# Patient Record
Sex: Female | Born: 1940
Health system: Southern US, Community
[De-identification: ages and names within clinical notes are randomized; demographics above are authoritative.]

## PROBLEM LIST (undated history)

## (undated) DIAGNOSIS — Z7901 Long term (current) use of anticoagulants: Secondary | ICD-10-CM

## (undated) DIAGNOSIS — E039 Hypothyroidism, unspecified: Secondary | ICD-10-CM

## (undated) DIAGNOSIS — I499 Cardiac arrhythmia, unspecified: Secondary | ICD-10-CM

## (undated) DIAGNOSIS — S42309A Unspecified fracture of shaft of humerus, unspecified arm, initial encounter for closed fracture: Secondary | ICD-10-CM

## (undated) DIAGNOSIS — O039 Complete or unspecified spontaneous abortion without complication: Secondary | ICD-10-CM

## (undated) DIAGNOSIS — I4891 Unspecified atrial fibrillation: Secondary | ICD-10-CM

## (undated) DIAGNOSIS — I10 Essential (primary) hypertension: Secondary | ICD-10-CM

## (undated) DIAGNOSIS — K5790 Diverticulosis of intestine, part unspecified, without perforation or abscess without bleeding: Secondary | ICD-10-CM

## (undated) DIAGNOSIS — M810 Age-related osteoporosis without current pathological fracture: Secondary | ICD-10-CM

## (undated) DIAGNOSIS — C801 Malignant (primary) neoplasm, unspecified: Secondary | ICD-10-CM

## (undated) DIAGNOSIS — K760 Fatty (change of) liver, not elsewhere classified: Secondary | ICD-10-CM

## (undated) DIAGNOSIS — K08109 Complete loss of teeth, unspecified cause, unspecified class: Secondary | ICD-10-CM

## (undated) DIAGNOSIS — I700 Atherosclerosis of aorta: Secondary | ICD-10-CM

## (undated) DIAGNOSIS — E785 Hyperlipidemia, unspecified: Secondary | ICD-10-CM

## (undated) DIAGNOSIS — I2510 Atherosclerotic heart disease of native coronary artery without angina pectoris: Secondary | ICD-10-CM

## (undated) DIAGNOSIS — R7309 Other abnormal glucose: Secondary | ICD-10-CM

## (undated) DIAGNOSIS — I509 Heart failure, unspecified: Secondary | ICD-10-CM

## (undated) DIAGNOSIS — Z972 Presence of dental prosthetic device (complete) (partial): Secondary | ICD-10-CM

## (undated) DIAGNOSIS — C4491 Basal cell carcinoma of skin, unspecified: Secondary | ICD-10-CM

## (undated) DIAGNOSIS — F0390 Unspecified dementia without behavioral disturbance: Secondary | ICD-10-CM

## (undated) DIAGNOSIS — G4733 Obstructive sleep apnea (adult) (pediatric): Secondary | ICD-10-CM

## (undated) DIAGNOSIS — S02609A Fracture of mandible, unspecified, initial encounter for closed fracture: Secondary | ICD-10-CM

## (undated) DIAGNOSIS — G4730 Sleep apnea, unspecified: Secondary | ICD-10-CM

## (undated) DIAGNOSIS — R0600 Dyspnea, unspecified: Secondary | ICD-10-CM

## (undated) DIAGNOSIS — K219 Gastro-esophageal reflux disease without esophagitis: Secondary | ICD-10-CM

## (undated) DIAGNOSIS — N183 Chronic kidney disease, stage 3 unspecified: Secondary | ICD-10-CM

## (undated) DIAGNOSIS — E041 Nontoxic single thyroid nodule: Secondary | ICD-10-CM

## (undated) DIAGNOSIS — N189 Chronic kidney disease, unspecified: Secondary | ICD-10-CM

## (undated) DIAGNOSIS — R918 Other nonspecific abnormal finding of lung field: Secondary | ICD-10-CM

## (undated) DIAGNOSIS — R0609 Other forms of dyspnea: Secondary | ICD-10-CM

## (undated) DIAGNOSIS — M1990 Unspecified osteoarthritis, unspecified site: Secondary | ICD-10-CM

## (undated) HISTORY — PX: CATARACT EXTRACTION: SUR2

## (undated) HISTORY — DX: Hyperlipidemia, unspecified: E78.5

## (undated) HISTORY — DX: Other abnormal glucose: R73.09

## (undated) HISTORY — DX: Complete or unspecified spontaneous abortion without complication: O03.9

## (undated) HISTORY — DX: Sleep apnea, unspecified: G47.30

## (undated) HISTORY — DX: Fracture of mandible, unspecified, initial encounter for closed fracture: S02.609A

## (undated) HISTORY — DX: Age-related osteoporosis without current pathological fracture: M81.0

## (undated) HISTORY — DX: Nontoxic single thyroid nodule: E04.1

## (undated) HISTORY — DX: Essential (primary) hypertension: I10

## (undated) HISTORY — PX: COLONOSCOPY: SHX174

## (undated) HISTORY — DX: Chronic kidney disease, unspecified: N18.9

## (undated) HISTORY — DX: Unspecified atrial fibrillation: I48.91

## (undated) HISTORY — DX: Unspecified fracture of shaft of humerus, unspecified arm, initial encounter for closed fracture: S42.309A

---

## 1995-02-16 DIAGNOSIS — S02609A Fracture of mandible, unspecified, initial encounter for closed fracture: Secondary | ICD-10-CM

## 1995-02-16 HISTORY — DX: Fracture of mandible, unspecified, initial encounter for closed fracture: S02.609A

## 2001-08-18 HISTORY — PX: JOINT REPLACEMENT: SHX530

## 2005-08-04 ENCOUNTER — Emergency Department: Payer: Self-pay | Admitting: Emergency Medicine

## 2005-09-25 ENCOUNTER — Other Ambulatory Visit: Payer: Self-pay

## 2005-10-02 ENCOUNTER — Inpatient Hospital Stay: Payer: Self-pay | Admitting: Unknown Physician Specialty

## 2009-06-19 ENCOUNTER — Ambulatory Visit: Payer: Self-pay | Admitting: Internal Medicine

## 2009-06-27 ENCOUNTER — Ambulatory Visit: Payer: Self-pay | Admitting: Internal Medicine

## 2010-04-03 ENCOUNTER — Ambulatory Visit: Payer: Self-pay | Admitting: Family Medicine

## 2010-04-04 ENCOUNTER — Ambulatory Visit (HOSPITAL_COMMUNITY): Admission: RE | Admit: 2010-04-04 | Discharge: 2010-04-05 | Payer: Self-pay | Admitting: Ophthalmology

## 2010-11-01 LAB — CBC
Hemoglobin: 12.1 g/dL (ref 12.0–15.0)
MCH: 30.1 pg (ref 26.0–34.0)
MCHC: 32.6 g/dL (ref 30.0–36.0)
WBC: 8.9 10*3/uL (ref 4.0–10.5)

## 2010-11-01 LAB — BASIC METABOLIC PANEL
BUN: 16 mg/dL (ref 6–23)
Calcium: 9.4 mg/dL (ref 8.4–10.5)
GFR calc non Af Amer: 51 mL/min — ABNORMAL LOW (ref >60)
Glucose, Bld: 105 mg/dL — ABNORMAL HIGH (ref 70–99)
Potassium: 4.6 mEq/L (ref 3.5–5.1)

## 2011-04-17 ENCOUNTER — Ambulatory Visit: Payer: Self-pay | Admitting: Family Medicine

## 2011-06-03 ENCOUNTER — Ambulatory Visit: Payer: Self-pay | Admitting: Family Medicine

## 2011-06-12 ENCOUNTER — Ambulatory Visit: Payer: Self-pay | Admitting: Family Medicine

## 2011-07-09 ENCOUNTER — Ambulatory Visit: Payer: Self-pay | Admitting: Family Medicine

## 2011-10-16 ENCOUNTER — Ambulatory Visit: Payer: Self-pay | Admitting: Family Medicine

## 2012-04-20 ENCOUNTER — Ambulatory Visit: Payer: Self-pay | Admitting: Family Medicine

## 2012-04-22 ENCOUNTER — Ambulatory Visit: Payer: Self-pay | Admitting: Unknown Physician Specialty

## 2012-04-27 LAB — PATHOLOGY REPORT

## 2012-05-05 ENCOUNTER — Ambulatory Visit: Payer: Self-pay | Admitting: Hematology and Oncology

## 2012-05-05 LAB — CBC CANCER CENTER
Basophil %: 0.9 %
Eosinophil %: 19.5 %
HGB: 12.5 g/dL (ref 12.0–16.0)
Lymphocyte %: 21.4 %
MCH: 30.6 pg (ref 26.0–34.0)
MCV: 94 fL (ref 80–100)
Monocyte #: 0.4 x10 3/mm (ref 0.2–0.9)
Monocyte %: 5.3 %
Neutrophil %: 52.9 %
Platelet: 150 x10 3/mm (ref 150–440)
RBC: 4.08 10*6/uL (ref 3.80–5.20)
WBC: 7 x10 3/mm (ref 3.6–11.0)

## 2012-05-18 ENCOUNTER — Ambulatory Visit: Payer: Self-pay | Admitting: Hematology and Oncology

## 2012-06-16 LAB — CBC CANCER CENTER
Basophil #: 0.1 x10 3/mm (ref 0.0–0.1)
Eosinophil #: 1.9 x10 3/mm — ABNORMAL HIGH (ref 0.0–0.7)
HGB: 12.5 g/dL (ref 12.0–16.0)
Lymphocyte #: 2 x10 3/mm (ref 1.0–3.6)
Lymphocyte %: 20 %
MCH: 29.8 pg (ref 26.0–34.0)
Monocyte #: 0.7 x10 3/mm (ref 0.2–0.9)
Neutrophil #: 5.3 x10 3/mm (ref 1.4–6.5)
Neutrophil %: 53.4 %
Platelet: 148 x10 3/mm — ABNORMAL LOW (ref 150–440)
RBC: 4.2 10*6/uL (ref 3.80–5.20)

## 2012-06-18 ENCOUNTER — Ambulatory Visit: Payer: Self-pay | Admitting: Hematology and Oncology

## 2012-08-18 HISTORY — PX: CHOLECYSTECTOMY: SHX55

## 2012-09-02 ENCOUNTER — Ambulatory Visit: Payer: Self-pay | Admitting: Unknown Physician Specialty

## 2012-09-21 ENCOUNTER — Ambulatory Visit: Payer: Self-pay | Admitting: General Surgery

## 2012-10-07 ENCOUNTER — Ambulatory Visit: Payer: Self-pay | Admitting: General Surgery

## 2012-10-14 ENCOUNTER — Ambulatory Visit: Payer: Self-pay | Admitting: General Surgery

## 2013-04-21 ENCOUNTER — Ambulatory Visit: Payer: Self-pay | Admitting: Family Medicine

## 2014-02-14 ENCOUNTER — Ambulatory Visit: Payer: Self-pay | Admitting: Family Medicine

## 2014-03-15 ENCOUNTER — Ambulatory Visit: Payer: Self-pay | Admitting: Family Medicine

## 2014-04-06 DIAGNOSIS — I482 Chronic atrial fibrillation, unspecified: Secondary | ICD-10-CM | POA: Insufficient documentation

## 2014-04-06 DIAGNOSIS — I10 Essential (primary) hypertension: Secondary | ICD-10-CM | POA: Insufficient documentation

## 2014-05-23 ENCOUNTER — Ambulatory Visit: Payer: Self-pay | Admitting: Family Medicine

## 2014-10-11 DIAGNOSIS — I5022 Chronic systolic (congestive) heart failure: Secondary | ICD-10-CM | POA: Insufficient documentation

## 2014-10-26 DIAGNOSIS — I509 Heart failure, unspecified: Secondary | ICD-10-CM

## 2014-10-26 HISTORY — DX: Heart failure, unspecified: I50.9

## 2014-12-08 NOTE — Op Note (Signed)
PATIENT NAME:  Brittney Ramirez, Brittney Ramirez MR#:  [id number] DATE OF BIRTH:  May 18, 1941  DATE OF PROCEDURE:  10/14/2012  PREOPERATIVE DIAGNOSIS: Chronic cholecystitis.   POSTOPERATIVE DIAGNOSIS: Chronic cholecystitis.   OPERATIVE PROCEDURE: Laparoscopic cholecystectomy with intraoperative cholangiograms.   SURGEON: Robert Bellow, MD.   ANESTHESIA: General endotracheal.   ESTIMATED BLOOD LOSS: Less than 5 mL.   CLINICAL NOTE: This 74 year old woman has had recurrent episodic abdominal pain. Ultrasound did not show evidence of cholelithiasis, but there was a decreased ejection fraction on HIDA scan with CCK administration and with reproduction of her symptoms. She was felt to be a candidate for elective cholecystectomy. She received Kefzol prior to the procedure.   Xarelto was discontinued 3 days prior to the procedure on the advice of Roderic Palau, RN, from Serafina Royals, MD's office.   OPERATIVE NOTE: With the patient under adequate general endotracheal anesthesia, the abdomen was prepped with ChloraPrep and draped. A Veress needle was placed through a transumbilical incision. After assuring intra-abdominal location with the hanging drop test, the abdomen was insufflated with CO2 at 10 mmHg pressure. A 10 mm step port was expanded and inspection showed no evidence of injury from initial port placement. The patient was placed in reverse Trendelenburg position and rolled to the left. There was chronic scarring around the gallbladder. The duodenum was adherent to the neck of the gallbladder. This was taken down with gentle dissection. The common duct was visualized. Fluoroscopic cholangiograms were completed using 19 mL of half-strength Conray 60. This showed slightly prominent ducts, but no evidence of obstruction or stones. Free flow into the duodenum. Right and left hepatic ducts were identified.   The cystic duct and cystic artery were doubly clipped and divided. The gallbladder was then removed from  the liver bed making use of hook cautery dissection. It was delivered through the umbilical port site without incident. Inspection from the epigastric site showed no evidence of injury from initial port placement. The right upper quadrant was irrigated with lactated Ringer solution. Clips were felt to be adequate. The abdomen was desufflated and ports removed under direct vision.   The skin incisions were closed with 4-0 Vicryl subcuticular sutures. Benzoin, Steri-Strips, Telfa and Tegaderm dressings were applied.   The patient tolerated the procedure well and was taken to the recovery room in stable condition.    ____________________________ Robert Bellow, MD jwb:jm D: 10/14/2012 12:36:17 ET T: 10/14/2012 14:17:13 ET JOB#: [id number]  cc: Robert Bellow, MD, <Dictator> Guadalupe Maple, MD Corey Skains, MD Katoya Amato Amedeo Kinsman MD ELECTRONICALLY SIGNED 10/16/2012 9:40

## 2014-12-08 NOTE — Op Note (Signed)
PATIENT NAME:  Brittney Ramirez, Brittney Ramirez MR#:  [id number] DATE OF BIRTH:  04-08-1941  DATE OF PROCEDURE:  10/14/2012  PREOPERATIVE DIAGNOSIS: Chronic cholecystitis.   POSTOPERATIVE DIAGNOSIS:  Chronic cholecystitis.   OPERATIVE PROCEDURE: Laparoscopic cholecystectomy with intraoperative cholangiograms.   SURGEON: Robert Bellow, MD  ANESTHESIA: General endotracheal under Dr. Carolin Sicks.   ESTIMATED BLOOD LOSS: 5 mL.   CLINICAL NOTE: This 74 year old woman has had episodic abdominal pain. Previous ultrasound had failed to show evidence of cholelithiasis. HIDA scanning has shown decreased ejection fraction. She was felt to be a candidate for cholecystectomy.   OPERATIVE NOTE: With the patient under adequate general endotracheal anesthesia, the abdomen prepped with ChloraPrep and draped. In Trendelenburg position, a Veress needle was placed through a transumbilical incision. After assuring intra-abdominal location with the hanging drop test, the abdomen was insufflated with CO2 at 10 mmHg mercury. No evidence of injury from initial port placement. In reverse Trendelenburg and rolled to the left, an 11 mm Xcel port was placed in the epigastrium and two  5 mm step ports placed laterally. There were noted to be marked adhesions between the omentum, duodenum and the gallbladder. These were taken down with gentle dissection. The neck of the gallbladder was cleared, Kumar clamp placed and fluoroscopic cholangiograms completed using 19 mL of Conray 60 diluted 1 to 2 with saline. Normal cholangiograms were appreciated. The cystic duct and cystic artery were doubly clipped and divided and the gallbladder removed from the liver bed making use of hook cautery dissection. It was then delivered through the umbilical port site without difficulty. The right upper quadrant was irrigated with lactated Ringer's solution. Good hemostasis was noted. The abdomen was then desufflated under direct vision. A 0 Vicryl suture was placed  at the umbilicus. Skin incisions were closed with 4-0 Vicryl subcuticular sutures. Benzoin, Steri-Strips, Telfa, and Tegaderm dressings were applied. The patient tolerated the procedure well and was taken to the recovery room in stable condition.   ____________________________ Robert Bellow, MD jwb:cc D: 10/14/2012 17:51:43 ET T: 10/14/2012 21:20:23 ET JOB#: [id number]  cc: Robert Bellow, MD, <Dictator> Guadalupe Maple, MD Corey Skains, MD Malcomb Gangemi Amedeo Kinsman MD ELECTRONICALLY SIGNED 10/16/2012 9:40

## 2015-02-08 ENCOUNTER — Telehealth: Payer: Self-pay | Admitting: Family Medicine

## 2015-02-08 ENCOUNTER — Other Ambulatory Visit: Payer: Self-pay | Admitting: Family Medicine

## 2015-02-08 ENCOUNTER — Encounter: Payer: Self-pay | Admitting: Family Medicine

## 2015-02-08 ENCOUNTER — Ambulatory Visit: Payer: Medicare PPO

## 2015-02-08 ENCOUNTER — Ambulatory Visit (INDEPENDENT_AMBULATORY_CARE_PROVIDER_SITE_OTHER): Payer: Medicare PPO | Admitting: Family Medicine

## 2015-02-08 ENCOUNTER — Ambulatory Visit
Admission: RE | Admit: 2015-02-08 | Discharge: 2015-02-08 | Disposition: A | Discharge status: Home / Self Care | Payer: Medicare PPO | Source: Ambulatory Visit | Attending: Family Medicine | Admitting: Family Medicine

## 2015-02-08 VITALS — BP 146/83 | HR 64 | Temp 98.1°F | Ht 64.0 in | Wt 206.0 lb

## 2015-02-08 DIAGNOSIS — M549 Dorsalgia, unspecified: Principal | ICD-10-CM

## 2015-02-08 DIAGNOSIS — M25559 Pain in unspecified hip: Secondary | ICD-10-CM | POA: Insufficient documentation

## 2015-02-08 DIAGNOSIS — R2689 Other abnormalities of gait and mobility: Secondary | ICD-10-CM | POA: Diagnosis not present

## 2015-02-08 DIAGNOSIS — G8929 Other chronic pain: Secondary | ICD-10-CM

## 2015-02-08 DIAGNOSIS — M419 Scoliosis, unspecified: Secondary | ICD-10-CM | POA: Diagnosis not present

## 2015-02-08 DIAGNOSIS — R109 Unspecified abdominal pain: Secondary | ICD-10-CM | POA: Diagnosis not present

## 2015-02-08 DIAGNOSIS — M545 Low back pain: Secondary | ICD-10-CM | POA: Diagnosis present

## 2015-02-08 DIAGNOSIS — R1030 Lower abdominal pain, unspecified: Secondary | ICD-10-CM

## 2015-02-08 NOTE — Telephone Encounter (Signed)
-----   Message from [place] sent at 02/08/2015  4:46 PM EDT ----- Line 4230/ BOTH XRAYS

## 2015-02-08 NOTE — Telephone Encounter (Signed)
Pt with cont abd pain as per note, x rays showing arthritis changes but otherwise no cause of pain Will send back to GI to further check

## 2015-02-08 NOTE — Progress Notes (Signed)
   BP 146/83 mmHg  Pulse 64  Temp(Src) 98.1 F (36.7 C)  Ht 5\' 4"  (1.626 m)  Wt 206 lb (93.441 kg)  BMI 35.34 kg/m2  SpO2 99%   Subjective:    Patient ID: Brittney Ramirez, female    DOB: 1941-04-29, 74 y.o.   MRN: [id number]  HPI: Brittney Ramirez is a 74 y.o. female  Chief Complaint  Patient presents with  . Hip Pain    right side  pt with 2-3 years of RLQ abd pain with neg CT x2 neg colonoscopy and GI consult Neg relief with gall stone removal Above from chart review and discussion with pt and son.  Pt with long hx after having knee replaced of short leg and marked limp No back pain or hip pain c/o  Relevant past medical, surgical, family and social history reviewed and updated as indicated. Interim medical history since our last visit reviewed. Allergies and medications reviewed and updated.  Review of Systems  Constitutional: Negative.   Respiratory: Negative.   Cardiovascular: Negative.   Endocrine: Negative.   Genitourinary: Negative.   Allergic/Immunologic: Negative.     Per HPI unless specifically indicated above     Objective:    BP 146/83 mmHg  Pulse 64  Temp(Src) 98.1 F (36.7 C)  Ht 5\' 4"  (1.626 m)  Wt 206 lb (93.441 kg)  BMI 35.34 kg/m2  SpO2 99%  Wt Readings from Last 3 Encounters:  02/08/15 206 lb (93.441 kg)  10/10/14 197 lb (89.359 kg)    Physical Exam  Constitutional: She is oriented to person, place, and time. She appears well-developed and well-nourished. No distress.  HENT:  Head: Normocephalic and atraumatic.  Right Ear: Hearing normal.  Left Ear: Hearing normal.  Nose: Nose normal.  Eyes: Conjunctivae and lids are normal. Right eye exhibits no discharge. Left eye exhibits no discharge. No scleral icterus.  Pulmonary/Chest: Effort normal. No respiratory distress.  Abdominal: There is tenderness.  Marked tenderness RLQ no mass  Musculoskeletal: Normal range of motion.  Hips full rom no pain  Neurological: She is alert and oriented to  person, place, and time.  Skin: Skin is intact. No rash noted.  Psychiatric: She has a normal mood and affect. Her speech is normal and behavior is normal. Judgment and thought content normal. Cognition and memory are normal.        Assessment & Plan:   Problem List Items Addressed This Visit    None    Visit Diagnoses    Abdominal pain in female    -  Primary    with chronic nature and neg w/u will xray hips and back    Limp        unknown if playing a role in sx or not but x rays may show  unable to use order entry for x ray        Follow up plan: Return if symptoms worsen or fail to improve.

## 2015-02-27 ENCOUNTER — Other Ambulatory Visit: Payer: Self-pay | Admitting: Family Medicine

## 2015-03-05 ENCOUNTER — Other Ambulatory Visit: Payer: Self-pay | Admitting: Family Medicine

## 2015-03-26 ENCOUNTER — Other Ambulatory Visit: Payer: Self-pay | Admitting: Physician Assistant

## 2015-03-26 DIAGNOSIS — R1031 Right lower quadrant pain: Secondary | ICD-10-CM

## 2015-03-26 DIAGNOSIS — G8929 Other chronic pain: Secondary | ICD-10-CM

## 2015-04-02 ENCOUNTER — Ambulatory Visit
Admission: RE | Admit: 2015-04-02 | Discharge: 2015-04-02 | Disposition: A | Discharge status: Home / Self Care | Payer: Medicare PPO | Source: Ambulatory Visit | Attending: Physician Assistant | Admitting: Physician Assistant

## 2015-04-02 DIAGNOSIS — R1031 Right lower quadrant pain: Secondary | ICD-10-CM | POA: Diagnosis present

## 2015-04-02 DIAGNOSIS — G8929 Other chronic pain: Secondary | ICD-10-CM | POA: Insufficient documentation

## 2015-04-02 DIAGNOSIS — K5730 Diverticulosis of large intestine without perforation or abscess without bleeding: Secondary | ICD-10-CM | POA: Diagnosis not present

## 2015-04-02 MED ORDER — IOHEXOL 300 MG/ML  SOLN
80.0000 mL | Freq: Once | INTRAMUSCULAR | Status: AC | PRN
  Administered 2015-04-02: 100 mL via INTRAVENOUS

## 2015-04-02 MED ORDER — IOHEXOL 300 MG/ML  SOLN: 100.0000 mL | Freq: Once | INTRAMUSCULAR | Status: DC | PRN

## 2015-04-16 ENCOUNTER — Emergency Department: Payer: Medicare PPO

## 2015-04-16 ENCOUNTER — Encounter: Payer: Self-pay | Admitting: Urgent Care

## 2015-04-16 ENCOUNTER — Emergency Department
Admission: EM | Admit: 2015-04-16 | Discharge: 2015-04-16 | Disposition: A | Discharge status: Home / Self Care | Payer: Medicare PPO | Attending: Student | Admitting: Student

## 2015-04-16 DIAGNOSIS — N189 Chronic kidney disease, unspecified: Secondary | ICD-10-CM | POA: Insufficient documentation

## 2015-04-16 DIAGNOSIS — S3992XA Unspecified injury of lower back, initial encounter: Secondary | ICD-10-CM | POA: Insufficient documentation

## 2015-04-16 DIAGNOSIS — E039 Hypothyroidism, unspecified: Secondary | ICD-10-CM | POA: Insufficient documentation

## 2015-04-16 DIAGNOSIS — W07XXXA Fall from chair, initial encounter: Secondary | ICD-10-CM | POA: Diagnosis not present

## 2015-04-16 DIAGNOSIS — Z7901 Long term (current) use of anticoagulants: Secondary | ICD-10-CM | POA: Diagnosis not present

## 2015-04-16 DIAGNOSIS — Y9289 Other specified places as the place of occurrence of the external cause: Secondary | ICD-10-CM | POA: Diagnosis not present

## 2015-04-16 DIAGNOSIS — M545 Low back pain, unspecified: Secondary | ICD-10-CM

## 2015-04-16 DIAGNOSIS — Z79899 Other long term (current) drug therapy: Secondary | ICD-10-CM | POA: Diagnosis not present

## 2015-04-16 DIAGNOSIS — Z87891 Personal history of nicotine dependence: Secondary | ICD-10-CM | POA: Insufficient documentation

## 2015-04-16 DIAGNOSIS — I129 Hypertensive chronic kidney disease with stage 1 through stage 4 chronic kidney disease, or unspecified chronic kidney disease: Secondary | ICD-10-CM | POA: Insufficient documentation

## 2015-04-16 DIAGNOSIS — Y9389 Activity, other specified: Secondary | ICD-10-CM | POA: Insufficient documentation

## 2015-04-16 DIAGNOSIS — Y998 Other external cause status: Secondary | ICD-10-CM | POA: Diagnosis not present

## 2015-04-16 DIAGNOSIS — E785 Hyperlipidemia, unspecified: Secondary | ICD-10-CM | POA: Diagnosis not present

## 2015-04-16 MED ORDER — TRAMADOL HCL 50 MG PO TABS: 50.0000 mg | ORAL_TABLET | Freq: Four times a day (QID) | ORAL | Status: DC | PRN

## 2015-04-16 MED ORDER — TRAMADOL HCL 50 MG PO TABS
50.0000 mg | ORAL_TABLET | Freq: Once | ORAL | Status: AC
  Administered 2015-04-16: 50 mg via ORAL
  Filled 2015-04-16: qty 1

## 2015-04-16 NOTE — ED Provider Notes (Signed)
[facility] Emergency Department Provider Note  ____________________________________________  Time seen: Approximately 10:24 PM  I have reviewed the triage vital signs and the nursing notes.   HISTORY  Chief Complaint Fall    HPI Brittney Ramirez is a 74 y.o. female patient complaining of low back pain status post falling from a chair. Patient states she twisted her back when she fell out of the chair and landed on her right side patient denies any pain to the right upper or right lower extremities. Patient is rating the pain as a 9/10 describe the sharp. Patient states pain increase ambulation. No palliative measures taken for this complaint.   Past Medical History  Diagnosis Date  . Chronic kidney disease   . Hyperlipidemia   . Hypertension   . Osteoporosis   . Atrial fibrillation   . Sleep apnea   . Elevated glucose   . Miscarriage   . Thyroid disease   . Thyroid cyst   . Jaw fracture   . Arm fracture     There are no active problems to display for this patient.   Past Surgical History  Procedure Laterality Date  . Cholecystectomy  2014  . Joint replacement Right 2003    Current Outpatient Rx  Name  Route  Sig  Dispense  Refill  . atorvastatin (LIPITOR) 40 MG tablet      TAKE 1 TABLET AT BEDTIME   90 tablet   1   . calcium carbonate (OS-CAL) 600 MG TABS tablet   Oral   Take 600 mg by mouth 2 (two) times daily with a meal.         . latanoprost (XALATAN) 0.005 % ophthalmic solution               . levothyroxine (LEVOXYL) 75 MCG tablet   Oral   Take 75 mcg by mouth daily before breakfast.         . metoprolol tartrate (LOPRESSOR) 25 MG tablet      TAKE 1 TABLET THREE TIMES DAILY   270 tablet   1   . omeprazole (PRILOSEC) 20 MG capsule   Oral   Take 20 mg by mouth daily.         . rivaroxaban (XARELTO) 20 MG TABS tablet   Oral   Take 20 mg by mouth daily.         . valsartan-hydrochlorothiazide (DIOVAN-HCT)  160-25 MG per tablet      TAKE 1 TABLET EVERY DAY   90 tablet   1     Allergies Codeine; Morphine and related; and Alendronate  Family History  Problem Relation Age of Onset  . Heart disease Mother   . Hypertension Mother   . Heart disease Father   . Hypertension Father   . Arthritis Sister   . Cancer Sister   . Diabetes Sister   . Heart disease Sister   . Diabetes Brother   . Heart disease Brother   . Hypertension Brother     Social History Social History  Substance Use Topics  . Smoking status: Former Smoker    Start date: 02/08/1955    Quit date: 02/08/1955  . Smokeless tobacco: Never Used  . Alcohol Use: No    Review of Systems Constitutional: No fever/chills Eyes: No visual changes. ENT: No sore throat. Cardiovascular: Denies chest pain. Respiratory: Denies shortness of breath. Gastrointestinal: No abdominal pain.  No nausea, no vomiting.  No diarrhea.  No constipation. Genitourinary: Negative for  dysuria. Musculoskeletal: Positive for back pain. Skin: Negative for rash. Neurological: Negative for headaches, focal weakness or numbness. Endocrine: Hyperlipidemia, hypothyroidism, and hypertension Allergic/Immunilogical: See medication list  10-point ROS otherwise negative.  ____________________________________________   PHYSICAL EXAM:  VITAL SIGNS: ED Triage Vitals  Enc Vitals Group     BP 04/16/15 2133 137/86 mmHg     Pulse Rate 04/16/15 2133 76     Resp 04/16/15 2133 18     Temp 04/16/15 2133 98.6 F (37 C)     Temp Source 04/16/15 2133 Oral     SpO2 04/16/15 2133 100 %     Weight 04/16/15 2133 190 lb (86.183 kg)     Height 04/16/15 2133 5\' 6"  (1.676 m)     Head Cir --      Peak Flow --      Pain Score 04/16/15 2133 9     Pain Loc --      Pain Edu? --      Excl. in [place]? --     Constitutional: Alert and oriented. Well appearing and in no acute distress. Eyes: Conjunctivae are normal. PERRL. EOMI. Head: Atraumatic. Nose: No  congestion/rhinnorhea. Mouth/Throat: Mucous membranes are moist.  Oropharynx non-erythematous. Neck: No stridor.  No cervical spine tenderness to palpation. Hematological/Lymphatic/Immunilogical: No cervical lymphadenopathy. Cardiovascular: Normal rate, regular rhythm. Grossly normal heart sounds.  Good peripheral circulation. Respiratory: Normal respiratory effort.  No retractions. Lungs CTAB. Gastrointestinal: Soft and nontender. No distention. No abdominal bruits. No CVA tenderness. Musculoskeletal: No spinal deformity. Patient has some moderate guarding L4-S1. Neurologic:  Normal speech and language. No gross focal neurologic deficits are appreciated. No gait instability. Skin:  Skin is warm, dry and intact. No rash noted. Psychiatric: Mood and affect are normal. Speech and behavior are normal.  ____________________________________________   LABS (all labs ordered are listed, but only abnormal results are displayed)  Labs Reviewed - No data to display ____________________________________________  EKG   ____________________________________________  RADIOLOGY  No acute findings. I, Sable Feil, personally viewed and evaluated these images (plain radiographs) as part of my medical decision making.   ____________________________________________   PROCEDURES  Procedure(s) performed: None  Critical Care performed: No  ____________________________________________   INITIAL IMPRESSION / ASSESSMENT AND PLAN / ED COURSE  Pertinent labs & imaging results that were available during my care of the patient were reviewed by me and considered in my medical decision making (see chart for details).  Back pain secondary to a fall. Moderate degenerative disc disease. Discussed x-ray findings with patient. Patient given advice and directions for supportive care. Patient given a three-day prescription for tramadol to take as needed for pain. Patient advised follow-up with the family  doctor if condition persists. ____________________________________________   FINAL CLINICAL IMPRESSION(S) / ED DIAGNOSES  Final diagnoses:  Back pain at L4-L5 level      Sable Feil, PA-C 04/16/15 [id number]  Joanne Gavel, MD 04/16/15 [phone]

## 2015-04-16 NOTE — ED Notes (Signed)
Patient transported to X-ray 

## 2015-04-16 NOTE — ED Notes (Signed)
Patient presents with c/o lower back pain s/p fall. Patient reports that she twisted wrong when she fell out of a deck chair. Reports falling onto RUE and RIGHT hip, however denies pain to both areas.

## 2015-04-17 ENCOUNTER — Encounter: Payer: Self-pay | Admitting: Family Medicine

## 2015-05-07 DIAGNOSIS — R918 Other nonspecific abnormal finding of lung field: Secondary | ICD-10-CM | POA: Insufficient documentation

## 2015-06-07 ENCOUNTER — Ambulatory Visit (INDEPENDENT_AMBULATORY_CARE_PROVIDER_SITE_OTHER): Payer: Medicare PPO | Admitting: Family Medicine

## 2015-06-07 ENCOUNTER — Encounter: Payer: Self-pay | Admitting: Family Medicine

## 2015-06-07 VITALS — BP 128/83 | HR 50 | Temp 98.6°F | Ht 63.7 in | Wt 204.0 lb

## 2015-06-07 DIAGNOSIS — I5022 Chronic systolic (congestive) heart failure: Secondary | ICD-10-CM | POA: Diagnosis not present

## 2015-06-07 DIAGNOSIS — R1084 Generalized abdominal pain: Secondary | ICD-10-CM

## 2015-06-07 DIAGNOSIS — N19 Unspecified kidney failure: Secondary | ICD-10-CM

## 2015-06-07 DIAGNOSIS — G4730 Sleep apnea, unspecified: Secondary | ICD-10-CM

## 2015-06-07 DIAGNOSIS — R55 Syncope and collapse: Secondary | ICD-10-CM

## 2015-06-07 DIAGNOSIS — I509 Heart failure, unspecified: Secondary | ICD-10-CM

## 2015-06-07 DIAGNOSIS — G8929 Other chronic pain: Secondary | ICD-10-CM

## 2015-06-07 DIAGNOSIS — Z0000 Encounter for general adult medical examination without abnormal findings: Secondary | ICD-10-CM | POA: Diagnosis not present

## 2015-06-07 DIAGNOSIS — I132 Hypertensive heart and chronic kidney disease with heart failure and with stage 5 chronic kidney disease, or end stage renal disease: Secondary | ICD-10-CM

## 2015-06-07 NOTE — Assessment & Plan Note (Addendum)
Discuss near-syncope and patient's symptoms.  most likely a combination of blood pressure drop along with longtime since last meal, extremely bad news. Possibly cardiac arrhythmia plan a role with poor perfusion. No evidence now of concern for pacemaker. Patient cautioned about further symptoms may need further heart monitoring Patient has referral appointment with cardiology coming up Reviewed last cardiology notes with patient stable. Will check CBC, CMP

## 2015-06-07 NOTE — Assessment & Plan Note (Signed)
Reviewed GI notes patient has appointment at [facility] coming up January

## 2015-06-07 NOTE — Progress Notes (Signed)
BP 128/83 mmHg  Pulse 50  Temp(Src) 98.6 F (37 C)  Ht 5' 3.7" (1.618 m)  Wt 204 lb (92.534 kg)  BMI 35.35 kg/m2  SpO2 99%   Subjective:    Patient ID: Brittney Ramirez, female    DOB: 1940/10/30, 74 y.o.   MRN: [id number]  HPI: Brittney Ramirez is a 74 y.o. female  Chief Complaint  Patient presents with  . Annual Exam   She with continued chronic localized abdominal pain is at complete GI workup CT scans etc. which are essentially negative through [facility] clinic GI. Patient has appointment at [facility] to further evaluate coming up in January. Reviewed CT scan of abdomen. Patient also had a fall with her her back and recovering okay x-ray showed no compression fractures There is a lot of degenerative joint disease and arthritis in her back. Hypertension doing well. No chest symptoms no chest tightness No PND orthopnea  Does not use CPAP machine because of poor sleep  Patient relates most recently though she had a fall very differently on September 26. About 1 PM patient had not eaten since breakfast. Patient was standing in the hallway listening to a phone conversation got very dizzy and lightheaded and was able to grab the door frame but unable to hang on and slid and fell backwards against the wall. She did hit her head slightly but no loss of consciousness was aware during the entire period. Laid back for a little while rested on the floor for about a minute or so then was able to get up and go okay with no subsequent spells or issues. She checked her pulse and blood pressure shortly afterwards blood pressure was 90/70 and doesn't remember what her pulse was. This also occurred after extremely bad news earlier that morning of a family member and suicide.  Relevant past medical, surgical, family and social history reviewed and updated as indicated. Interim medical history since our last visit reviewed. Allergies and medications reviewed and updated.  Other than noted above Review of  Systems  Constitutional: Negative.   HENT: Negative.   Eyes: Negative.   Respiratory: Negative.   Cardiovascular: Negative.   Gastrointestinal: Negative.   Endocrine: Negative.   Genitourinary: Negative.   Musculoskeletal: Negative.   Skin: Negative.   Allergic/Immunologic: Negative.   Neurological: Negative.   Hematological: Negative.   Psychiatric/Behavioral: Negative.     Per HPI unless specifically indicated above     Objective:    BP 128/83 mmHg  Pulse 50  Temp(Src) 98.6 F (37 C)  Ht 5' 3.7" (1.618 m)  Wt 204 lb (92.534 kg)  BMI 35.35 kg/m2  SpO2 99%  Wt Readings from Last 3 Encounters:  06/07/15 204 lb (92.534 kg)  04/16/15 190 lb (86.183 kg)  02/08/15 206 lb (93.441 kg)    Physical Exam  Constitutional: She is oriented to person, place, and time. She appears well-developed and well-nourished.  HENT:  Head: Normocephalic and atraumatic.  Right Ear: External ear normal.  Left Ear: External ear normal.  Nose: Nose normal.  Mouth/Throat: Oropharynx is clear and moist.  Eyes: Conjunctivae and EOM are normal. Pupils are equal, round, and reactive to light.  Neck: Normal range of motion. Neck supple. Carotid bruit is not present.  Cardiovascular:  No murmur heard. Irregular rhythm and rate  Pulmonary/Chest: Effort normal and breath sounds normal. She exhibits no mass. Right breast exhibits no mass, no skin change and no tenderness. Left breast exhibits no mass, no skin change  and no tenderness. Breasts are symmetrical.  Abdominal: Soft. Bowel sounds are normal. There is no hepatosplenomegaly.  Musculoskeletal: Normal range of motion.  Neurological: She is alert and oriented to person, place, and time.  Skin: No rash noted.  Psychiatric: She has a normal mood and affect. Her behavior is normal. Judgment and thought content normal.   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  most likely a combination of blood pressure drop along with longtime since last meal, extremely bad news. Possibly cardiac arrhythmia plan a role with poor perfusion. No evidence now of concern for pacemaker. Patient cautioned about further symptoms may need further heart monitoring Patient has referral appointment with cardiology coming up Reviewed last cardiology notes with patient stable. Will check CBC, CMP      Relevant Orders   Comprehensive metabolic panel   CBC with Differential/Platelet     Other   Sleep apnea    No CPAP use      Chronic generalized abdominal pain    Reviewed GI notes patient has appointment at [facility] coming up January       Other Visit Diagnoses    PE (physical exam), annual            Follow up plan: Return in about 6 months (around 12/06/2015), or if symptoms worsen or fail to improve, for Recheck BMP lipid panel, ALT, AST.

## 2015-06-07 NOTE — Assessment & Plan Note (Signed)
No CPAP use

## 2015-06-07 NOTE — Assessment & Plan Note (Signed)
Followed by cardiology 

## 2015-06-08 LAB — COMPREHENSIVE METABOLIC PANEL
A/G RATIO: 1.9 (ref 1.1–2.5)
ALT: 15 IU/L (ref 0–32)
AST: 23 IU/L (ref 0–40)
Albumin: 4.4 g/dL (ref 3.5–4.8)
Alkaline Phosphatase: 68 IU/L (ref 39–117)
BILIRUBIN TOTAL: 0.7 mg/dL (ref 0.0–1.2)
BUN/Creatinine Ratio: 17 (ref 11–26)
BUN: 21 mg/dL (ref 8–27)
CALCIUM: 10.4 mg/dL — AB (ref 8.7–10.3)
CHLORIDE: 97 mmol/L (ref 97–106)
CO2: 25 mmol/L (ref 18–29)
Creatinine, Ser: 1.23 mg/dL — ABNORMAL HIGH (ref 0.57–1.00)
GFR, EST AFRICAN AMERICAN: 49 mL/min/{1.73_m2} — AB (ref >59)
GFR, EST NON AFRICAN AMERICAN: 43 mL/min/{1.73_m2} — AB (ref >59)
GLOBULIN, TOTAL: 2.3 g/dL (ref 1.5–4.5)
Glucose: 100 mg/dL — ABNORMAL HIGH (ref 65–99)
POTASSIUM: 5 mmol/L (ref 3.5–5.2)
SODIUM: 137 mmol/L (ref 136–144)
TOTAL PROTEIN: 6.7 g/dL (ref 6.0–8.5)

## 2015-06-08 LAB — CBC WITH DIFFERENTIAL/PLATELET
BASOS ABS: 0.1 10*3/uL (ref 0.0–0.2)
BASOS: 1 %
EOS (ABSOLUTE): 1 10*3/uL — AB (ref 0.0–0.4)
Eos: 14 %
Hematocrit: 40.1 % (ref 34.0–46.6)
Hemoglobin: 13 g/dL (ref 11.1–15.9)
Immature Grans (Abs): 0 10*3/uL (ref 0.0–0.1)
Immature Granulocytes: 0 %
LYMPHS ABS: 2 10*3/uL (ref 0.7–3.1)
Lymphs: 26 %
MCH: 29.5 pg (ref 26.6–33.0)
MCHC: 32.4 g/dL (ref 31.5–35.7)
MCV: 91 fL (ref 79–97)
MONOS ABS: 0.5 10*3/uL (ref 0.1–0.9)
Monocytes: 7 %
NEUTROS ABS: 4 10*3/uL (ref 1.4–7.0)
Neutrophils: 52 %
Platelets: 193 10*3/uL (ref 150–379)
RBC: 4.41 x10E6/uL (ref 3.77–5.28)
RDW: 15.3 % (ref 12.3–15.4)
WBC: 7.6 10*3/uL (ref 3.4–10.8)

## 2015-06-11 ENCOUNTER — Encounter: Payer: Self-pay | Admitting: Family Medicine

## 2015-06-20 ENCOUNTER — Other Ambulatory Visit: Payer: Self-pay | Admitting: Specialist

## 2015-06-20 DIAGNOSIS — R911 Solitary pulmonary nodule: Secondary | ICD-10-CM

## 2015-07-19 ENCOUNTER — Other Ambulatory Visit: Payer: Self-pay | Admitting: Family Medicine

## 2015-07-24 ENCOUNTER — Other Ambulatory Visit: Payer: Self-pay | Admitting: Family Medicine

## 2015-10-01 ENCOUNTER — Other Ambulatory Visit: Payer: Self-pay

## 2015-10-01 MED ORDER — VALSARTAN-HYDROCHLOROTHIAZIDE 160-25 MG PO TABS: 1.0000 | ORAL_TABLET | Freq: Every day | ORAL | Status: DC

## 2015-10-01 MED ORDER — LEVOTHYROXINE SODIUM 75 MCG PO TABS: 75.0000 ug | ORAL_TABLET | Freq: Every day | ORAL | Status: DC

## 2015-10-01 MED ORDER — METOPROLOL TARTRATE 25 MG PO TABS: 25.0000 mg | ORAL_TABLET | Freq: Three times a day (TID) | ORAL | Status: DC

## 2015-10-01 MED ORDER — ATORVASTATIN CALCIUM 40 MG PO TABS: 40.0000 mg | ORAL_TABLET | Freq: Every day | ORAL | Status: DC

## 2015-10-01 MED ORDER — OMEPRAZOLE 20 MG PO CPDR: 20.0000 mg | DELAYED_RELEASE_CAPSULE | Freq: Every day | ORAL | Status: DC

## 2015-10-11 ENCOUNTER — Telehealth: Payer: Self-pay | Admitting: Family Medicine

## 2015-10-11 NOTE — Telephone Encounter (Signed)
Patient called stating that she needs to talk to Dr. Jeananne Rama about change in her medication. Please call her back [phone]

## 2015-10-11 NOTE — Telephone Encounter (Signed)
Phone call Discussed with patient Brittney Ramirez workup for abdominal pain was take more Prilosec and occasional ibuprofen concerned because taking blood thinner reviewed use rare ibuprofen

## 2015-11-29 ENCOUNTER — Other Ambulatory Visit: Payer: Self-pay | Admitting: Family Medicine

## 2015-12-05 ENCOUNTER — Ambulatory Visit (INDEPENDENT_AMBULATORY_CARE_PROVIDER_SITE_OTHER): Payer: Medicare PPO | Admitting: Family Medicine

## 2015-12-05 ENCOUNTER — Encounter: Payer: Self-pay | Admitting: Family Medicine

## 2015-12-05 VITALS — BP 118/86 | HR 105 | Temp 97.8°F | Ht 64.5 in | Wt 208.0 lb

## 2015-12-05 DIAGNOSIS — I509 Heart failure, unspecified: Secondary | ICD-10-CM

## 2015-12-05 DIAGNOSIS — R1084 Generalized abdominal pain: Secondary | ICD-10-CM

## 2015-12-05 DIAGNOSIS — I482 Chronic atrial fibrillation, unspecified: Secondary | ICD-10-CM

## 2015-12-05 DIAGNOSIS — N19 Unspecified kidney failure: Secondary | ICD-10-CM

## 2015-12-05 DIAGNOSIS — E039 Hypothyroidism, unspecified: Secondary | ICD-10-CM | POA: Diagnosis not present

## 2015-12-05 DIAGNOSIS — E785 Hyperlipidemia, unspecified: Secondary | ICD-10-CM | POA: Diagnosis not present

## 2015-12-05 DIAGNOSIS — I132 Hypertensive heart and chronic kidney disease with heart failure and with stage 5 chronic kidney disease, or end stage renal disease: Secondary | ICD-10-CM | POA: Diagnosis not present

## 2015-12-05 DIAGNOSIS — G8929 Other chronic pain: Secondary | ICD-10-CM

## 2015-12-05 DIAGNOSIS — I4891 Unspecified atrial fibrillation: Secondary | ICD-10-CM | POA: Insufficient documentation

## 2015-12-05 LAB — LP+ALT+AST PICCOLO, WAIVED
ALT (SGPT) Piccolo, Waived: 18 U/L (ref 10–47)
AST (SGOT) PICCOLO, WAIVED: 39 U/L — AB (ref 11–38)
CHOLESTEROL PICCOLO, WAIVED: 146 mg/dL (ref <200)
Chol/HDL Ratio Piccolo,Waive: 2.8 mg/dL
HDL CHOL PICCOLO, WAIVED: 52 mg/dL — AB (ref >59)
LDL Chol Calc Piccolo Waived: 65 mg/dL (ref <100)
Triglycerides Piccolo,Waived: 145 mg/dL (ref <150)
VLDL Chol Calc Piccolo,Waive: 29 mg/dL (ref <30)

## 2015-12-05 MED ORDER — ATORVASTATIN CALCIUM 40 MG PO TABS: 40.0000 mg | ORAL_TABLET | Freq: Every day | ORAL | Status: DC

## 2015-12-05 MED ORDER — VALSARTAN-HYDROCHLOROTHIAZIDE 160-25 MG PO TABS: 1.0000 | ORAL_TABLET | Freq: Every day | ORAL | Status: DC

## 2015-12-05 MED ORDER — LEVOTHYROXINE SODIUM 75 MCG PO TABS: 75.0000 ug | ORAL_TABLET | Freq: Every day | ORAL | Status: DC

## 2015-12-05 MED ORDER — METOPROLOL TARTRATE 25 MG PO TABS: 25.0000 mg | ORAL_TABLET | Freq: Three times a day (TID) | ORAL | Status: DC

## 2015-12-05 NOTE — Assessment & Plan Note (Signed)
Has had extensive workups most effective treatment has been over-the-counter lidocaine patches is helping control patient's symptoms.

## 2015-12-05 NOTE — Progress Notes (Signed)
BP 118/86 mmHg  Pulse 105  Temp(Src) 97.8 F (36.6 C)  Ht 5' 4.5" (1.638 m)  Wt 208 lb (94.348 kg)  BMI 35.16 kg/m2  SpO2 95%   Subjective:    Patient ID: Brittney Ramirez, female    DOB: 1940/09/09, 75 y.o.   MRN: [id number]  HPI: Brittney Ramirez is a 75 y.o. female  Chief Complaint  Patient presents with  . Hypertension   Patient doing well with blood pressure medications no complaints issues good control of blood pressure Reviewed cardiology notes patient's CHF atrial fib stable on medication Cholesterol doing well with medications no issues or concerns Abdominal pain issues stabilizing with over-the-counter lidocaine patch Having pulmonary workup for that a nodule  Relevant past medical, surgical, family and social history reviewed and updated as indicated. Interim medical history since our last visit reviewed. Allergies and medications reviewed and updated.  Review of Systems  Constitutional: Negative.   Respiratory: Negative.   Cardiovascular: Negative.     Per HPI unless specifically indicated above     Objective:    BP 118/86 mmHg  Pulse 105  Temp(Src) 97.8 F (36.6 C)  Ht 5' 4.5" (1.638 m)  Wt 208 lb (94.348 kg)  BMI 35.16 kg/m2  SpO2 95%  Wt Readings from Last 3 Encounters:  12/05/15 208 lb (94.348 kg)  06/07/15 204 lb (92.534 kg)  04/16/15 190 lb (86.183 kg)    Physical Exam  Constitutional: She is oriented to person, place, and time. She appears well-developed and well-nourished. No distress.  HENT:  Head: Normocephalic and atraumatic.  Right Ear: Hearing normal.  Left Ear: Hearing normal.  Nose: Nose normal.  Eyes: Conjunctivae and lids are normal. Right eye exhibits no discharge. Left eye exhibits no discharge. No scleral icterus.  Pulmonary/Chest: Effort normal and breath sounds normal. No respiratory distress.  Musculoskeletal: Normal range of motion.  Neurological: She is alert and oriented to person, place, and time.  Skin: Skin is  intact. No rash noted.  Psychiatric: She has a normal mood and affect. Her speech is normal and behavior is normal. Judgment and thought content normal. Cognition and memory are normal.    Results for orders placed or performed in visit on 06/07/15  Comprehensive metabolic panel  Result Value Ref Range   Glucose 100 (H) 65 - 99 mg/dL   BUN 21 8 - 27 mg/dL   Creatinine, Ser 1.23 (H) 0.57 - 1.00 mg/dL   GFR calc non Af Amer 43 (L) >59 mL/min/1.73   GFR calc Af Amer 49 (L) >59 mL/min/1.73   BUN/Creatinine Ratio 17 11 - 26   Sodium 137 136 - 144 mmol/L   Potassium 5.0 3.5 - 5.2 mmol/L   Chloride 97 97 - 106 mmol/L   CO2 25 18 - 29 mmol/L   Calcium 10.4 (H) 8.7 - 10.3 mg/dL   Total Protein 6.7 6.0 - 8.5 g/dL   Albumin 4.4 3.5 - 4.8 g/dL   Globulin, Total 2.3 1.5 - 4.5 g/dL   Albumin/Globulin Ratio 1.9 1.1 - 2.5   Bilirubin Total 0.7 0.0 - 1.2 mg/dL   Alkaline Phosphatase 68 39 - 117 IU/L   AST 23 0 - 40 IU/L   ALT 15 0 - 32 IU/L  CBC with Differential/Platelet  Result Value Ref Range   WBC 7.6 3.4 - 10.8 x10E3/uL   RBC 4.41 3.77 - 5.28 x10E6/uL   Hemoglobin 13.0 11.1 - 15.9 g/dL   Hematocrit 40.1 34.0 - 46.6 %  MCV 91 79 - 97 fL   MCH 29.5 26.6 - 33.0 pg   MCHC 32.4 31.5 - 35.7 g/dL   RDW 15.3 12.3 - 15.4 %   Platelets 193 150 - 379 x10E3/uL   Neutrophils 52 %   Lymphs 26 %   Monocytes 7 %   Eos 14 %   Basos 1 %   Neutrophils Absolute 4.0 1.4 - 7.0 x10E3/uL   Lymphocytes Absolute 2.0 0.7 - 3.1 x10E3/uL   Monocytes Absolute 0.5 0.1 - 0.9 x10E3/uL   EOS (ABSOLUTE) 1.0 (H) 0.0 - 0.4 x10E3/uL   Basophils Absolute 0.1 0.0 - 0.2 x10E3/uL   Immature Granulocytes 0 %   Immature Grans (Abs) 0.0 0.0 - 0.1 x10E3/uL      Assessment & Plan:   Problem List Items Addressed This Visit      Cardiovascular and Mediastinum   Hypertensive heart and renal disease with congestive heart failure and renal failure (HCC) - Primary   Relevant Medications   XARELTO 15 MG TABS tablet    atorvastatin (LIPITOR) 40 MG tablet   metoprolol tartrate (LOPRESSOR) 25 MG tablet   valsartan-hydrochlorothiazide (DIOVAN-HCT) 160-25 MG tablet   Other Relevant Orders   LP+ALT+AST Piccolo, Waived   Basic metabolic panel   Atrial fibrillation (HCC)   Relevant Medications   XARELTO 15 MG TABS tablet   atorvastatin (LIPITOR) 40 MG tablet   metoprolol tartrate (LOPRESSOR) 25 MG tablet   valsartan-hydrochlorothiazide (DIOVAN-HCT) 160-25 MG tablet     Endocrine   Hypothyroidism   Relevant Medications   levothyroxine (LEVOXYL) 75 MCG tablet   metoprolol tartrate (LOPRESSOR) 25 MG tablet     Other   Chronic generalized abdominal pain    Has had extensive workups most effective treatment has been over-the-counter lidocaine patches is helping control patient's symptoms.      Hyperlipidemia    The current medical regimen is effective;  continue present plan and medications.       Relevant Medications   XARELTO 15 MG TABS tablet   atorvastatin (LIPITOR) 40 MG tablet   metoprolol tartrate (LOPRESSOR) 25 MG tablet   valsartan-hydrochlorothiazide (DIOVAN-HCT) 160-25 MG tablet       Follow up plan: Return in about 6 months (around 06/05/2016) for Physical Exam.

## 2015-12-05 NOTE — Assessment & Plan Note (Signed)
The current medical regimen is effective;  continue present plan and medications.  

## 2015-12-06 ENCOUNTER — Telehealth: Payer: Self-pay | Admitting: Family Medicine

## 2015-12-06 LAB — BASIC METABOLIC PANEL
BUN/Creatinine Ratio: 12 (ref 12–28)
BUN: 16 mg/dL (ref 8–27)
CALCIUM: 9.5 mg/dL (ref 8.7–10.3)
CO2: 27 mmol/L (ref 18–29)
CREATININE: 1.33 mg/dL — AB (ref 0.57–1.00)
Chloride: 101 mmol/L (ref 96–106)
GFR, EST AFRICAN AMERICAN: 45 mL/min/{1.73_m2} — AB (ref >59)
GFR, EST NON AFRICAN AMERICAN: 39 mL/min/{1.73_m2} — AB (ref >59)
Glucose: 102 mg/dL — ABNORMAL HIGH (ref 65–99)
POTASSIUM: 4.4 mmol/L (ref 3.5–5.2)
Sodium: 143 mmol/L (ref 134–144)

## 2015-12-06 NOTE — Telephone Encounter (Signed)
Phone call Discussed with patient mild exacerbation of right kidney disease patient's been taking some ibuprofen discuss not to use any type of nonsteroidal anti-inflammatory agent and reviewed those kinds of medicines with patient Will recheck at physical.

## 2015-12-14 ENCOUNTER — Ambulatory Visit
Admission: RE | Admit: 2015-12-14 | Discharge: 2015-12-14 | Disposition: A | Discharge status: Home / Self Care | Payer: Medicare PPO | Source: Ambulatory Visit | Attending: Specialist | Admitting: Specialist

## 2015-12-14 DIAGNOSIS — I712 Thoracic aortic aneurysm, without rupture, unspecified: Secondary | ICD-10-CM

## 2015-12-14 DIAGNOSIS — R918 Other nonspecific abnormal finding of lung field: Secondary | ICD-10-CM | POA: Diagnosis not present

## 2015-12-14 DIAGNOSIS — R911 Solitary pulmonary nodule: Secondary | ICD-10-CM | POA: Diagnosis present

## 2015-12-14 HISTORY — DX: Thoracic aortic aneurysm, without rupture, unspecified: I71.20

## 2016-05-14 DIAGNOSIS — N183 Chronic kidney disease, stage 3 unspecified: Secondary | ICD-10-CM | POA: Insufficient documentation

## 2016-05-14 DIAGNOSIS — K219 Gastro-esophageal reflux disease without esophagitis: Secondary | ICD-10-CM | POA: Insufficient documentation

## 2016-05-23 ENCOUNTER — Other Ambulatory Visit: Payer: Self-pay | Admitting: Family Medicine

## 2016-06-10 ENCOUNTER — Encounter: Payer: Self-pay | Admitting: Family Medicine

## 2016-06-10 ENCOUNTER — Ambulatory Visit (INDEPENDENT_AMBULATORY_CARE_PROVIDER_SITE_OTHER): Payer: Medicare PPO | Admitting: Family Medicine

## 2016-06-10 VITALS — BP 138/96 | HR 112 | Temp 98.0°F | Ht 64.5 in | Wt 201.0 lb

## 2016-06-10 DIAGNOSIS — Z1231 Encounter for screening mammogram for malignant neoplasm of breast: Secondary | ICD-10-CM

## 2016-06-10 DIAGNOSIS — R0989 Other specified symptoms and signs involving the circulatory and respiratory systems: Secondary | ICD-10-CM

## 2016-06-10 DIAGNOSIS — R1084 Generalized abdominal pain: Secondary | ICD-10-CM | POA: Diagnosis not present

## 2016-06-10 DIAGNOSIS — N19 Unspecified kidney failure: Secondary | ICD-10-CM

## 2016-06-10 DIAGNOSIS — E039 Hypothyroidism, unspecified: Secondary | ICD-10-CM | POA: Diagnosis not present

## 2016-06-10 DIAGNOSIS — G4730 Sleep apnea, unspecified: Secondary | ICD-10-CM

## 2016-06-10 DIAGNOSIS — G8929 Other chronic pain: Secondary | ICD-10-CM

## 2016-06-10 DIAGNOSIS — Z0000 Encounter for general adult medical examination without abnormal findings: Secondary | ICD-10-CM

## 2016-06-10 DIAGNOSIS — I132 Hypertensive heart and chronic kidney disease with heart failure and with stage 5 chronic kidney disease, or end stage renal disease: Secondary | ICD-10-CM

## 2016-06-10 DIAGNOSIS — Z780 Asymptomatic menopausal state: Secondary | ICD-10-CM

## 2016-06-10 DIAGNOSIS — I509 Heart failure, unspecified: Secondary | ICD-10-CM

## 2016-06-10 DIAGNOSIS — E782 Mixed hyperlipidemia: Secondary | ICD-10-CM

## 2016-06-10 LAB — URINALYSIS, ROUTINE W REFLEX MICROSCOPIC
BILIRUBIN UA: NEGATIVE
Glucose, UA: NEGATIVE
Ketones, UA: NEGATIVE
NITRITE UA: NEGATIVE
PH UA: 7 (ref 5.0–7.5)
Specific Gravity, UA: 1.015 (ref 1.005–1.030)
UUROB: 0.2 mg/dL (ref 0.2–1.0)

## 2016-06-10 LAB — MICROSCOPIC EXAMINATION

## 2016-06-10 MED ORDER — OMEPRAZOLE 20 MG PO CPDR: 20.0000 mg | DELAYED_RELEASE_CAPSULE | Freq: Every day | ORAL | 4 refills | Status: DC

## 2016-06-10 MED ORDER — ATORVASTATIN CALCIUM 40 MG PO TABS: 40.0000 mg | ORAL_TABLET | Freq: Every day | ORAL | 4 refills | Status: DC

## 2016-06-10 MED ORDER — METOPROLOL TARTRATE 25 MG PO TABS: 25.0000 mg | ORAL_TABLET | Freq: Three times a day (TID) | ORAL | 4 refills | Status: DC

## 2016-06-10 MED ORDER — VALSARTAN-HYDROCHLOROTHIAZIDE 160-25 MG PO TABS: 1.0000 | ORAL_TABLET | Freq: Every day | ORAL | 4 refills | Status: DC

## 2016-06-10 MED ORDER — LEVOTHYROXINE SODIUM 75 MCG PO TABS: 75.0000 ug | ORAL_TABLET | Freq: Every day | ORAL | 4 refills | Status: DC

## 2016-06-10 NOTE — Assessment & Plan Note (Signed)
The current medical regimen is effective;  continue present plan and medications.  

## 2016-06-10 NOTE — Assessment & Plan Note (Signed)
Not using her CPAP

## 2016-06-10 NOTE — Assessment & Plan Note (Addendum)
The current medical regimen is effective;  continue present plan and medications. Also followed by cardiology and stable

## 2016-06-10 NOTE — Progress Notes (Signed)
BP (!) 138/96   Pulse (!) 112   Temp 98 F (36.7 C)   Ht 5' 4.5" (1.638 m)   Wt 201 lb (91.2 kg)   SpO2 99%   BMI 33.97 kg/m    Subjective:    Patient ID: Brittney Ramirez, female    DOB: [birth year], 75 y.o.   MRN: [id number]  HPI: Brittney Ramirez is a 75 y.o. female  Chief Complaint  Patient presents with  . Annual Exam  AWV metrics met. Patient with continued right abdominal discomfort has had extensive workup all negative both here at [facility] and in [facility] with no findings symptoms maybe a little better and no worse. Reviewed medical issues with patient doing well has taken medicines without problems no bleeding bruising issues Due to mixup with [employer] patient has an abundance of medications in reserve only needs her thyroid medicines Takes meds faithfully without side effects. Patient also relates when it's quieted night and she is lying down she can hear swishing sound in her right ear. Special attention to carotid bruits none were heard. Relevant past medical, surgical, family and social history reviewed and updated as indicated. Interim medical history since our last visit reviewed. Allergies and medications reviewed and updated.  Review of Systems  Constitutional: Negative.   HENT: Negative.   Eyes: Negative.   Respiratory: Negative.   Cardiovascular: Negative.   Gastrointestinal: Negative.   Endocrine: Negative.   Genitourinary: Negative.   Musculoskeletal: Negative.   Skin: Negative.   Allergic/Immunologic: Negative.   Neurological: Negative.   Hematological: Negative.   Psychiatric/Behavioral: Negative.     Per HPI unless specifically indicated above     Objective:    BP (!) 138/96   Pulse (!) 112   Temp 98 F (36.7 C)   Ht 5' 4.5" (1.638 m)   Wt 201 lb (91.2 kg)   SpO2 99%   BMI 33.97 kg/m   Wt Readings from Last 3 Encounters:  06/10/16 201 lb (91.2 kg)  12/05/15 208 lb (94.3 kg)  06/07/15 204 lb (92.5 kg)    Physical Exam    Constitutional: She is oriented to person, place, and time. She appears well-developed and well-nourished.  HENT:  Head: Normocephalic and atraumatic.  Right Ear: External ear normal.  Left Ear: External ear normal.  Nose: Nose normal.  Mouth/Throat: Oropharynx is clear and moist.  Eyes: Conjunctivae and EOM are normal. Pupils are equal, round, and reactive to light.  Neck: Normal range of motion. Neck supple. Carotid bruit is not present.  Cardiovascular: Normal rate, regular rhythm and normal heart sounds.   No murmur heard. Pulmonary/Chest: Effort normal and breath sounds normal. She exhibits no mass. Right breast exhibits no mass, no skin change and no tenderness. Left breast exhibits no mass, no skin change and no tenderness. Breasts are symmetrical.  Abdominal: Soft. Bowel sounds are normal. There is no hepatosplenomegaly.  Musculoskeletal: Normal range of motion.  Neurological: She is alert and oriented to person, place, and time.  Skin: No rash noted.  Psychiatric: She has a normal mood and affect. Her behavior is normal. Judgment and thought content normal.    Results for orders placed or performed in visit on 12/05/15  LP+ALT+AST Piccolo, [employer]  Result Value Ref Range   ALT (SGPT) Piccolo, Waived 18 10 - 47 U/L   AST (SGOT) Piccolo, Waived 39 (H) 11 - 38 U/L   Cholesterol Piccolo, Waived 146 <200 mg/dL   HDL Chol Piccolo, Waived 52 (L) >  59 mg/dL   Triglycerides Piccolo,Waived 145 <150 mg/dL   Chol/HDL Ratio Piccolo,Waive 2.8 mg/dL   LDL Chol Calc Piccolo Waived 65 <100 mg/dL   VLDL Chol Calc Piccolo,Waive 29 <30 mg/dL  Basic metabolic panel  Result Value Ref Range   Glucose 102 (H) 65 - 99 mg/dL   BUN 16 8 - 27 mg/dL   Creatinine, Ser 1.33 (H) 0.57 - 1.00 mg/dL   GFR calc non Af Amer 39 (L) >59 mL/min/1.73   GFR calc Af Amer 45 (L) >59 mL/min/1.73   BUN/Creatinine Ratio 12 12 - 28   Sodium 143 134 - 144 mmol/L   Potassium 4.4 3.5 - 5.2 mmol/L   Chloride 101 96 -  106 mmol/L   CO2 27 18 - 29 mmol/L   Calcium 9.5 8.7 - 10.3 mg/dL      Assessment & Plan:   Problem List Items Addressed This Visit      Cardiovascular and Mediastinum   Hypertensive heart and renal disease with congestive heart failure and renal failure (HCC)    The current medical regimen is effective;  continue present plan and medications. Also followed by cardiology and stable       Relevant Medications   valsartan-hydrochlorothiazide (DIOVAN-HCT) 160-25 MG tablet   metoprolol tartrate (LOPRESSOR) 25 MG tablet   atorvastatin (LIPITOR) 40 MG tablet   Other Relevant Orders   CBC with Differential/Platelet   Comprehensive metabolic panel   Urinalysis, Routine w reflex microscopic (not at [facility])     Respiratory   Sleep apnea    Not using her CPAP        Endocrine   Hypothyroidism    The current medical regimen is effective;  continue present plan and medications.       Relevant Medications   metoprolol tartrate (LOPRESSOR) 25 MG tablet   levothyroxine (SYNTHROID, LEVOTHROID) 75 MCG tablet     Other   Chronic generalized abdominal pain    Undiagnosed cause but stable no exacerbations      Hyperlipidemia - Primary    The current medical regimen is effective;  continue present plan and medications.       Relevant Medications   valsartan-hydrochlorothiazide (DIOVAN-HCT) 160-25 MG tablet   metoprolol tartrate (LOPRESSOR) 25 MG tablet   atorvastatin (LIPITOR) 40 MG tablet   Other Relevant Orders   Lipid Profile   Right carotid bruit    No bruit heard the patient hears swishing sound at night will set up for carotid ultrasound      Relevant Orders   VAS US CAROTID    Other Visit Diagnoses    PE (physical exam), annual       Relevant Orders   CBC with Differential/Platelet   Comprehensive metabolic panel   Urinalysis, Routine w reflex microscopic (not at [facility])   Lipid Profile   TSH   DG Bone Density   Screening mammogram, encounter for       Relevant  Orders   MM Digital Screening   Post-menopausal       Relevant Orders   DG Bone Density       Follow up plan: Return in about 6 months (around 12/09/2016) for BMP,  Lipids, ALT, AST.

## 2016-06-10 NOTE — Assessment & Plan Note (Signed)
Undiagnosed cause but stable no exacerbations

## 2016-06-10 NOTE — Assessment & Plan Note (Signed)
No bruit heard the patient hears swishing sound at night will set up for carotid ultrasound

## 2016-06-11 ENCOUNTER — Telehealth: Payer: Self-pay | Admitting: Family Medicine

## 2016-06-11 DIAGNOSIS — E039 Hypothyroidism, unspecified: Secondary | ICD-10-CM

## 2016-06-11 LAB — CBC WITH DIFFERENTIAL/PLATELET
BASOS ABS: 0 10*3/uL (ref 0.0–0.2)
BASOS: 1 %
EOS (ABSOLUTE): 1.7 10*3/uL — AB (ref 0.0–0.4)
Eos: 22 %
HEMATOCRIT: 40.3 % (ref 34.0–46.6)
Hemoglobin: 13.5 g/dL (ref 11.1–15.9)
Immature Grans (Abs): 0 10*3/uL (ref 0.0–0.1)
Immature Granulocytes: 0 %
Lymphocytes Absolute: 1.9 10*3/uL (ref 0.7–3.1)
Lymphs: 25 %
MCH: 31 pg (ref 26.6–33.0)
MCHC: 33.5 g/dL (ref 31.5–35.7)
MCV: 93 fL (ref 79–97)
MONOS ABS: 0.6 10*3/uL (ref 0.1–0.9)
Monocytes: 8 %
NEUTROS ABS: 3.4 10*3/uL (ref 1.4–7.0)
NEUTROS PCT: 44 %
Platelets: 187 10*3/uL (ref 150–379)
RBC: 4.35 x10E6/uL (ref 3.77–5.28)
RDW: 15.3 % (ref 12.3–15.4)
WBC: 7.7 10*3/uL (ref 3.4–10.8)

## 2016-06-11 LAB — LIPID PANEL
CHOL/HDL RATIO: 2.9 ratio (ref 0.0–4.4)
CHOLESTEROL TOTAL: 157 mg/dL (ref 100–199)
HDL: 55 mg/dL (ref >39)
LDL Calculated: 76 mg/dL (ref 0–99)
TRIGLYCERIDES: 130 mg/dL (ref 0–149)
VLDL Cholesterol Cal: 26 mg/dL (ref 5–40)

## 2016-06-11 LAB — COMPREHENSIVE METABOLIC PANEL
A/G RATIO: 1.6 (ref 1.2–2.2)
ALK PHOS: 68 IU/L (ref 39–117)
ALT: 13 IU/L (ref 0–32)
AST: 19 IU/L (ref 0–40)
Albumin: 4.4 g/dL (ref 3.5–4.8)
BILIRUBIN TOTAL: 0.7 mg/dL (ref 0.0–1.2)
BUN/Creatinine Ratio: 12 (ref 12–28)
BUN: 16 mg/dL (ref 8–27)
CHLORIDE: 101 mmol/L (ref 96–106)
CO2: 24 mmol/L (ref 18–29)
Calcium: 10 mg/dL (ref 8.7–10.3)
Creatinine, Ser: 1.34 mg/dL — ABNORMAL HIGH (ref 0.57–1.00)
GFR calc Af Amer: 44 mL/min/{1.73_m2} — ABNORMAL LOW (ref >59)
GFR calc non Af Amer: 38 mL/min/{1.73_m2} — ABNORMAL LOW (ref >59)
GLOBULIN, TOTAL: 2.7 g/dL (ref 1.5–4.5)
Glucose: 96 mg/dL (ref 65–99)
POTASSIUM: 4.4 mmol/L (ref 3.5–5.2)
SODIUM: 143 mmol/L (ref 134–144)
Total Protein: 7.1 g/dL (ref 6.0–8.5)

## 2016-06-11 LAB — TSH: TSH: 10.27 u[IU]/mL — ABNORMAL HIGH (ref 0.450–4.500)

## 2016-06-11 MED ORDER — LEVOTHYROXINE SODIUM 88 MCG PO TABS: 88.0000 ug | ORAL_TABLET | Freq: Every day | ORAL | 0 refills | Status: DC

## 2016-06-11 NOTE — Telephone Encounter (Signed)
Phone call Discussed with patient thyroid elevated reviewed patient taking thyroid medications appropriately without fail. Will increase from 75 g to 88 g daily Recheck TSH 1-2 months

## 2016-06-19 ENCOUNTER — Ambulatory Visit (INDEPENDENT_AMBULATORY_CARE_PROVIDER_SITE_OTHER): Payer: Medicare PPO

## 2016-06-19 DIAGNOSIS — R0989 Other specified symptoms and signs involving the circulatory and respiratory systems: Secondary | ICD-10-CM

## 2016-06-20 ENCOUNTER — Encounter (INDEPENDENT_AMBULATORY_CARE_PROVIDER_SITE_OTHER): Payer: Self-pay

## 2016-07-02 ENCOUNTER — Telehealth: Payer: Self-pay | Admitting: Family Medicine

## 2016-07-02 NOTE — Telephone Encounter (Signed)
Call returned.

## 2016-07-02 NOTE — Telephone Encounter (Signed)
Pt called stated she is returning a call from Dr. Jeananne Rama. Please call pt back at your earliest convenience. Thanks.

## 2016-07-07 ENCOUNTER — Other Ambulatory Visit: Payer: Self-pay | Admitting: Specialist

## 2016-07-07 DIAGNOSIS — R918 Other nonspecific abnormal finding of lung field: Secondary | ICD-10-CM

## 2016-07-17 ENCOUNTER — Ambulatory Visit
Admission: RE | Admit: 2016-07-17 | Discharge: 2016-07-17 | Disposition: A | Discharge status: Home / Self Care | Payer: Medicare PPO | Source: Ambulatory Visit | Attending: Family Medicine | Admitting: Family Medicine

## 2016-07-17 DIAGNOSIS — Z780 Asymptomatic menopausal state: Secondary | ICD-10-CM

## 2016-07-17 DIAGNOSIS — Z1231 Encounter for screening mammogram for malignant neoplasm of breast: Secondary | ICD-10-CM | POA: Diagnosis not present

## 2016-07-17 DIAGNOSIS — Z0000 Encounter for general adult medical examination without abnormal findings: Secondary | ICD-10-CM | POA: Insufficient documentation

## 2016-07-17 DIAGNOSIS — M810 Age-related osteoporosis without current pathological fracture: Secondary | ICD-10-CM | POA: Diagnosis not present

## 2016-07-17 DIAGNOSIS — I4891 Unspecified atrial fibrillation: Secondary | ICD-10-CM | POA: Insufficient documentation

## 2016-07-17 DIAGNOSIS — Z13820 Encounter for screening for osteoporosis: Secondary | ICD-10-CM | POA: Insufficient documentation

## 2016-07-17 DIAGNOSIS — Z8262 Family history of osteoporosis: Secondary | ICD-10-CM | POA: Insufficient documentation

## 2016-07-21 ENCOUNTER — Ambulatory Visit: Payer: Medicare PPO

## 2016-08-02 ENCOUNTER — Other Ambulatory Visit: Payer: Self-pay | Admitting: Family Medicine

## 2016-08-02 DIAGNOSIS — E782 Mixed hyperlipidemia: Secondary | ICD-10-CM

## 2016-08-02 DIAGNOSIS — N19 Unspecified kidney failure: Principal | ICD-10-CM

## 2016-08-02 DIAGNOSIS — I132 Hypertensive heart and chronic kidney disease with heart failure and with stage 5 chronic kidney disease, or end stage renal disease: Secondary | ICD-10-CM

## 2016-08-06 ENCOUNTER — Other Ambulatory Visit: Payer: Medicare PPO

## 2016-08-06 DIAGNOSIS — E039 Hypothyroidism, unspecified: Secondary | ICD-10-CM

## 2016-08-07 ENCOUNTER — Telehealth: Payer: Self-pay | Admitting: Family Medicine

## 2016-08-07 DIAGNOSIS — E039 Hypothyroidism, unspecified: Secondary | ICD-10-CM

## 2016-08-07 LAB — TSH: TSH: 5.65 u[IU]/mL — AB (ref 0.450–4.500)

## 2016-08-07 NOTE — Telephone Encounter (Signed)
Routing to provider to advise.  

## 2016-08-07 NOTE — Telephone Encounter (Signed)
Phone call Patient to stop the Xarelto one day before cornea surgery. Discuss TSH improved only one month between blood draws will wait a couple more months recheck TSH before making any further adjustments in Synthroid

## 2016-08-07 NOTE — Telephone Encounter (Signed)
Pt called and stated that she is scheduled to have a procedure 08/21/2016 and she would like to know when to stop taking her blood thinner.

## 2016-08-27 ENCOUNTER — Telehealth: Payer: Self-pay | Admitting: Family Medicine

## 2016-08-29 NOTE — Telephone Encounter (Signed)
Received request from [name] regarding Brittney Ramirez.  Patient no longer has [employer] per website.

## 2016-09-22 ENCOUNTER — Other Ambulatory Visit: Payer: Medicare PPO

## 2016-09-22 ENCOUNTER — Other Ambulatory Visit: Payer: Self-pay | Admitting: Family Medicine

## 2016-09-22 DIAGNOSIS — E039 Hypothyroidism, unspecified: Secondary | ICD-10-CM

## 2016-09-22 NOTE — Telephone Encounter (Signed)
Last OV: 06/10/16 Next OV: 12/09/16  Lab Results  Component Value Date   TSH 5.650 (H) 08/06/2016

## 2016-09-22 NOTE — Telephone Encounter (Signed)
Patient was in today for labs.  She wanted to let Dr Jeananne Rama know she only has three pills left for her thyroid medication.  Thanks

## 2016-09-23 LAB — TSH: TSH: 4.2 u[IU]/mL (ref 0.450–4.500)

## 2016-09-23 MED ORDER — LEVOTHYROXINE SODIUM 88 MCG PO TABS: 88.0000 ug | ORAL_TABLET | Freq: Every day | ORAL | 3 refills | Status: DC

## 2016-11-03 DIAGNOSIS — R0602 Shortness of breath: Secondary | ICD-10-CM | POA: Insufficient documentation

## 2016-11-28 ENCOUNTER — Ambulatory Visit
Admission: RE | Admit: 2016-11-28 | Discharge: 2016-11-28 | Disposition: A | Discharge status: Home / Self Care | Payer: Medicare PPO | Source: Ambulatory Visit | Attending: Specialist | Admitting: Specialist

## 2016-11-28 DIAGNOSIS — R918 Other nonspecific abnormal finding of lung field: Secondary | ICD-10-CM | POA: Diagnosis not present

## 2016-11-28 DIAGNOSIS — I2510 Atherosclerotic heart disease of native coronary artery without angina pectoris: Secondary | ICD-10-CM | POA: Diagnosis not present

## 2016-11-28 DIAGNOSIS — I700 Atherosclerosis of aorta: Secondary | ICD-10-CM | POA: Diagnosis not present

## 2016-12-09 ENCOUNTER — Ambulatory Visit (INDEPENDENT_AMBULATORY_CARE_PROVIDER_SITE_OTHER): Payer: Medicare PPO | Admitting: Family Medicine

## 2016-12-09 ENCOUNTER — Encounter: Payer: Self-pay | Admitting: Family Medicine

## 2016-12-09 VITALS — BP 127/84 | HR 70 | Wt 198.0 lb

## 2016-12-09 DIAGNOSIS — I132 Hypertensive heart and chronic kidney disease with heart failure and with stage 5 chronic kidney disease, or end stage renal disease: Secondary | ICD-10-CM | POA: Diagnosis not present

## 2016-12-09 DIAGNOSIS — N19 Unspecified kidney failure: Secondary | ICD-10-CM

## 2016-12-09 DIAGNOSIS — I712 Thoracic aortic aneurysm, without rupture, unspecified: Secondary | ICD-10-CM | POA: Insufficient documentation

## 2016-12-09 DIAGNOSIS — I5022 Chronic systolic (congestive) heart failure: Secondary | ICD-10-CM

## 2016-12-09 DIAGNOSIS — I700 Atherosclerosis of aorta: Secondary | ICD-10-CM

## 2016-12-09 DIAGNOSIS — I10 Essential (primary) hypertension: Secondary | ICD-10-CM

## 2016-12-09 DIAGNOSIS — E782 Mixed hyperlipidemia: Secondary | ICD-10-CM | POA: Diagnosis not present

## 2016-12-09 LAB — LP+ALT+AST PICCOLO, WAIVED
ALT (SGPT) Piccolo, Waived: 9 U/L — ABNORMAL LOW (ref 10–47)
AST (SGOT) Piccolo, Waived: 30 U/L (ref 11–38)
CHOL/HDL RATIO PICCOLO,WAIVE: 2.6 mg/dL
Cholesterol Piccolo, Waived: 132 mg/dL (ref <200)
HDL Chol Piccolo, Waived: 50 mg/dL — ABNORMAL LOW (ref >59)
LDL Chol Calc Piccolo Waived: 60 mg/dL (ref <100)
TRIGLYCERIDES PICCOLO,WAIVED: 109 mg/dL (ref <150)
VLDL CHOL CALC PICCOLO,WAIVE: 22 mg/dL (ref <30)

## 2016-12-09 NOTE — Assessment & Plan Note (Signed)
The current medical regimen is effective;  continue present plan and medications.  

## 2016-12-09 NOTE — Progress Notes (Addendum)
   BP 127/84   Pulse 70   Wt 198 lb (89.8 kg)   SpO2 98%   BMI 33.46 kg/m    Subjective:    Patient ID: Brittney Ramirez, female    DOB: 1941/02/24, 76 y.o.   MRN: [id number]  HPI: Brittney Ramirez is a 76 y.o. female  Chief Complaint  Patient presents with  . Follow-up  . Hyperlipidemia  . Tinnitus    Right   Patient all in all doing well just finished working with cardiology for systolic heart failure and workup has increase metoprolol to 50 twice a day and doing well with no complaints. Other medications doing well. Patient's cardiac workup was otherwise negative. On lungs are being followed by Dr. Raul Del had had follow-up CT also and doing okay from that point of view. Taking cholesterol medicines without any problems. Also taking thyroid without any problems.  Relevant past medical, surgical, family and social history reviewed and updated as indicated. Interim medical history since our last visit reviewed. Allergies and medications reviewed and updated.  Review of Systems  Constitutional: Negative.   Respiratory: Negative.   Cardiovascular: Negative.     Per HPI unless specifically indicated above     Objective:    BP 127/84   Pulse 70   Wt 198 lb (89.8 kg)   SpO2 98%   BMI 33.46 kg/m   Wt Readings from Last 3 Encounters:  12/09/16 198 lb (89.8 kg)  06/10/16 201 lb (91.2 kg)  12/05/15 208 lb (94.3 kg)    Physical Exam  Constitutional: She is oriented to person, place, and time. She appears well-developed and well-nourished.  HENT:  Head: Normocephalic and atraumatic.  Eyes: Conjunctivae and EOM are normal.  Neck: Normal range of motion.  Cardiovascular: Normal rate, regular rhythm and normal heart sounds.   Pulmonary/Chest: Effort normal and breath sounds normal.  Musculoskeletal: Normal range of motion.  Neurological: She is alert and oriented to person, place, and time.  Skin: No erythema.  Psychiatric: She has a normal mood and affect. Her behavior is  normal. Judgment and thought content normal.    Results for orders placed or performed in visit on 09/22/16  TSH  Result Value Ref Range   TSH 4.200 0.450 - 4.500 uIU/mL      Assessment & Plan:   Problem List Items Addressed This Visit      Cardiovascular and Mediastinum   Hypertensive heart and renal disease with congestive heart failure and renal failure ([place]) - Primary    The current medical regimen is effective;  continue present plan and medications.       Relevant Orders   Basic metabolic panel   LP+ALT+AST Piccolo, Waived   CHF (congestive heart failure), NYHA class II, chronic, systolic (HCC)   Benign essential HTN   Relevant Orders   Basic metabolic panel   LP+ALT+AST Piccolo, Waived   Thoracic aortic aneurysm without rupture (HCC)   Aortic calcification (HCC)     Other   Hyperlipidemia    The current medical regimen is effective;  continue present plan and medications.       Relevant Orders   Basic metabolic panel   LP+ALT+AST Piccolo, Waived       Follow up plan: Return in about 6 months (around 06/10/2017) for Physical Exam.

## 2016-12-10 ENCOUNTER — Encounter: Payer: Self-pay | Admitting: Family Medicine

## 2016-12-10 LAB — BASIC METABOLIC PANEL
BUN / CREAT RATIO: 16 (ref 12–28)
BUN: 20 mg/dL (ref 8–27)
CO2: 24 mmol/L (ref 18–29)
CREATININE: 1.22 mg/dL — AB (ref 0.57–1.00)
Calcium: 9.6 mg/dL (ref 8.7–10.3)
Chloride: 101 mmol/L (ref 96–106)
GFR calc non Af Amer: 43 mL/min/{1.73_m2} — ABNORMAL LOW (ref >59)
GFR, EST AFRICAN AMERICAN: 49 mL/min/{1.73_m2} — AB (ref >59)
Glucose: 96 mg/dL (ref 65–99)
Potassium: 4.1 mmol/L (ref 3.5–5.2)
SODIUM: 143 mmol/L (ref 134–144)

## 2016-12-30 ENCOUNTER — Ambulatory Visit (INDEPENDENT_AMBULATORY_CARE_PROVIDER_SITE_OTHER): Payer: Medicare PPO | Admitting: Vascular Surgery

## 2016-12-30 ENCOUNTER — Encounter (INDEPENDENT_AMBULATORY_CARE_PROVIDER_SITE_OTHER): Payer: Self-pay | Admitting: Vascular Surgery

## 2016-12-30 VITALS — BP 141/103 | HR 84 | Resp 17 | Ht 66.0 in | Wt 195.0 lb

## 2016-12-30 DIAGNOSIS — E782 Mixed hyperlipidemia: Secondary | ICD-10-CM | POA: Diagnosis not present

## 2016-12-30 DIAGNOSIS — I712 Thoracic aortic aneurysm, without rupture, unspecified: Secondary | ICD-10-CM

## 2016-12-30 DIAGNOSIS — I700 Atherosclerosis of aorta: Secondary | ICD-10-CM | POA: Diagnosis not present

## 2016-12-30 DIAGNOSIS — I10 Essential (primary) hypertension: Secondary | ICD-10-CM

## 2016-12-30 DIAGNOSIS — I482 Chronic atrial fibrillation, unspecified: Secondary | ICD-10-CM

## 2016-12-30 NOTE — Assessment & Plan Note (Signed)
Her pulmonologist however astutely noted that her ascending thoracic aorta was dilated and measured approximately 4.2-4.3 cm in maximal diameter. The remainder of the thoracic aorta identified was normal in caliber. She had a follow-up ultrasound of her abdominal aorta which demonstrated no abdominal aortic aneurysm. We had a lengthy discussion today regarding the pathophysiology and natural history of aneurysmal disease. At this size, she does not have any immediate risk of rupture and is very likely to be asymptomatic from this. I would certainly recommend surveillance of this, and given the small size would recommend the next check be in about 2 years. I have discussed the signs and symptoms of thoracic aortic aneurysms. I have discussed the reason and rationale for continued surveillance. The patient voices her understanding and is agreeable to proceed with our plan of care

## 2016-12-30 NOTE — Patient Instructions (Signed)
Thoracic Aortic Aneurysm An aneurysm is a bulge in an artery. It happens when blood pushes up against a weakened or damaged artery wall. A thoracic aortic aneurysm is an aneurysm that occurs in the first part of the aorta, between the heart and the diaphragm. The aorta is the main artery of the body. It supplies blood from the heart to the rest of the body. Some aneurysms may not cause symptoms or problems. However, the major concern with a thoracic aortic aneurysm is that it can enlarge and burst (rupture), or blood can flow between the layers of the wall of the aorta through a tear (aorticdissection). Both of these conditions can cause bleeding inside the body and can be life-threatening if they are not diagnosed and treated right away. What are the causes? The exact cause of this condition is not known. What increases the risk? The following factors may make you more likely to develop this condition:  Being age 29 or older.  Having a hardening of the arteries caused by the buildup of fat and other substances in the lining of a blood vessel (arteriosclerosis).  Having inflammation of the walls of an artery (arteritis).  Having a genetic disease that weakens the body's connective tissue, such as Marfan syndrome.  Having an injury or trauma to the aorta.  Having an infection that is caused by bacteria, such as syphilis or staphylococcus, in the wall of the aorta (infectious aortitis).  Having high blood pressure (hypertension).  Being female.  Being white (Caucasian).  Having high cholesterol.  Having a family history of aneurysms.  Using tobacco.  Having chronic obstructive pulmonary disease (COPD). What are the signs or symptoms? Symptoms of this condition vary depending on the size and rate of growth of the aneurysm. Most grow slowly and do not cause any symptoms. When symptoms do occur, they may include:  Pain in the chest, back, sides, or abdomen. The pain may vary in  intensity. A sudden onset of severe pain may indicate that the aneurysm has ruptured.  Hoarseness.  Cough.  Shortness of breath.  Swallowing problems.  Swelling in the face, arms, or legs.  Fever.  Unexplained weight loss. How is this diagnosed? This condition may be diagnosed with:  An ultrasound.  X-rays.  A CT scan.  An MRI.  Tests to check the arteries for damage or blockages (angiogram). Most unruptured thoracic aortic aneurysms cause no symptoms, so they are often found during exams for other conditions. How is this treated? Treatment for this condition depends on:  The size of the aneurysm.  How fast the aneurysm is growing.  Your age.  Risk factors for rupture. Aneurysms that are smaller than 2.2 inches (5.5 cm) may be managed by using medicines to control blood pressure, manage pain, or fight infection. You may need regular monitoring to see if the aneurysm is getting bigger. Your health care provider may recommend that you have an ultrasound every year or every 6 months. How often you need to have an ultrasound depends on the size of the aneurysm, how fast it is growing, and whether you have a family history of aneurysms. Surgical repair may be needed if your aneurysm is larger than 2.2 inches or if it is growing quickly. Follow these instructions at home: Eating and drinking   Eat a healthy diet. Your health care provider may recommend that you:  Lower your salt (sodium) intake. In some people, too much salt can raise blood pressure and increase the risk of  thoracic aortic aneurysm.  Avoid foods that are high in saturated fat and cholesterol, such as red meat and dairy.  Eat a diet that is low in sugar.  Increase your fiber intake by including whole grains, vegetables, and fruits in your diet. Eating these foods may help to lower blood pressure.  Limit or avoid alcohol as recommended by your health care provider. Lifestyle   Follow instructions from  your health care provider about healthy lifestyle habits. Your health care provider may recommend that you:  Do not use any products that contain nicotine or tobacco, such as cigarettes and e-cigarettes. If you need help quitting, ask your health care provider.  Keep your blood pressure within normal limits. The target limit for most people is below 120/80. Check your blood pressure regularly. If it is high, ask your health care provider about ways that you can control it.  Keep your blood sugar (glucose) level and cholesterol levels within normal limits. Target limits for most people are:  Blood glucose level: Less than 100 mg/dL.  Total cholesterol level: Less than 200 mg/dL.  Maintain a healthy weight. Activity   Stay physically active and exercise regularly. Talk with your health care provider about how often you should exercise and ask which types of exercise are safe for you.  Avoid heavy lifting and activities that take a lot of effort (are strenuous). Ask your health care provider what activities are safe for you. General instructions   Keep all follow-up visits as told by your health care provider. This is important.  Talk with your health care provider about regular screenings to see if the aneurysm is getting bigger.  Take over-the-counter and prescription medicines only as told by your health care provider. Contact a health care provider if:  You have discomfort in your upper back, neck, or abdomen.  You have trouble swallowing.  You have a cough or hoarseness.  You have a family history of aneurysms.  You have unexplained weight loss. Get help right away if:  You have sudden, severe pain in your upper back and abdomen. This pain may move into your chest and arms.  You have shortness of breath.  You have a fever. This information is not intended to replace advice given to you by your health care provider. Make sure you discuss any questions you have with your  health care provider. Document Released: 08/04/2005 Document Revised: 05/16/2016 Document Reviewed: 05/16/2016 Elsevier Interactive Patient Education  2017 [employer].

## 2016-12-30 NOTE — Assessment & Plan Note (Signed)
lipid control important in reducing the progression of atherosclerotic disease. Continue statin therapy  

## 2016-12-30 NOTE — Assessment & Plan Note (Signed)
On anticoagulation 

## 2016-12-30 NOTE — Assessment & Plan Note (Signed)
Seen on CT scan. No symptoms of significant atherosclerotic occlusive disease associated with this.

## 2016-12-30 NOTE — Progress Notes (Signed)
Patient ID: Brittney Ramirez, female   DOB: 27-Oct-1940, 76 y.o.   MRN: [id number]  Chief Complaint  Patient presents with  . New Evaluation    AAA seen on CT    HPI Brittney Ramirez is a 76 y.o. female.  I am asked to see the patient by Dr. Raul Del for evaluation of thoracic aortic aneurysm.  The patient reports CT scan for screening was performed which did not show any obvious lung cancer. The official report of the CT scan does not mention any vascular abnormalities of note. The radiologist apparently did not see that at the time of the study. Her pulmonologist however astutely noted that her ascending thoracic aorta was dilated and measured approximately 4.2-4.3 cm in maximal diameter. The remainder of the thoracic aorta identified was normal in caliber. She had a follow-up ultrasound of her abdominal aorta which demonstrated no abdominal aortic aneurysm. She is then referred for further evaluation and/or treatment. She denies any aneurysm related symptoms. Specifically, she denies any chest pain, back pain, or signs of peripheral embolization. I have independently reviewed the CT scan of the chest and would agree with Dr. Gust Brooms assessment that the ascending thoracic aorta is mildly enlarged.   Past Medical History:  Diagnosis Date  . Arm fracture   . Atrial fibrillation ([place])   . Chronic kidney disease   . Elevated glucose   . Hyperlipidemia   . Hypertension   . Jaw fracture ([place])   . Miscarriage   . Osteoporosis   . Sleep apnea   . Thyroid cyst     Past Surgical History:  Procedure Laterality Date  . CATARACT EXTRACTION Right   . CHOLECYSTECTOMY  2014  . JOINT REPLACEMENT Right 2003    Family History  Problem Relation Age of Onset  . Heart disease Mother   . Hypertension Mother   . Heart disease Father   . Hypertension Father   . Arthritis Sister   . Cancer Sister   . Diabetes Sister   . Heart disease Sister   . Breast cancer Sister 43  . Diabetes Brother   .  Heart disease Brother   . Hypertension Brother      Social History Social History  Substance Use Topics  . Smoking status: Former Smoker    Start date: 02/08/1955    Quit date: 02/08/1955  . Smokeless tobacco: Never Used  . Alcohol use No  No IV drug use  Allergies  Allergen Reactions  . Codeine Nausea Only  . Morphine And Related Nausea And Vomiting    Diarrhea  . Alendronate Rash  . Buprenorphine Hcl Nausea And Vomiting    Diarrhea  . Desipramine Nausea Only    Current Outpatient Prescriptions  Medication Sig Dispense Refill  . atorvastatin (LIPITOR) 40 MG tablet Take 1 tablet (40 mg total) by mouth at bedtime. 90 tablet 4  . calcium carbonate (OS-CAL) 600 MG TABS tablet Take 600 mg by mouth 2 (two) times daily with a meal.    . latanoprost (XALATAN) 0.005 % ophthalmic solution     . levothyroxine (SYNTHROID, LEVOTHROID) 88 MCG tablet Take 1 tablet (88 mcg total) by mouth daily before breakfast. 90 tablet 3  . metoprolol tartrate (LOPRESSOR) 25 MG tablet Take 1 tablet (25 mg total) by mouth 3 (three) times daily. 270 tablet 4  . omeprazole (PRILOSEC) 20 MG capsule Take 1 capsule (20 mg total) by mouth daily. 90 capsule 4  . valsartan-hydrochlorothiazide (DIOVAN-HCT) 160-25 MG tablet  Take 1 tablet by mouth daily. 90 tablet 4  . XARELTO 15 MG TABS tablet 15 mg daily.     No current facility-administered medications for this visit.       REVIEW OF SYSTEMS (Negative unless checked)  Constitutional: [] Weight loss  [] Fever  [] Chills Cardiac: [] Chest pain   [] Chest pressure   [x] Palpitations   [] Shortness of breath when laying flat   [] Shortness of breath at rest   [x] Shortness of breath with exertion. Vascular:  [] Pain in legs with walking   [] Pain in legs at rest   [] Pain in legs when laying flat   [] Claudication   [] Pain in feet when walking  [] Pain in feet at rest  [] Pain in feet when laying flat   [] History of DVT   [] Phlebitis   [] Swelling in legs   [] Varicose veins    [] Non-healing ulcers Pulmonary:   [] Uses home oxygen   [] Productive cough   [] Hemoptysis   [] Wheeze  [] COPD   [] Asthma Neurologic:  [] Dizziness  [] Blackouts   [] Seizures   [] History of stroke   [] History of TIA  [] Aphasia   [] Temporary blindness   [] Dysphagia   [] Weakness or numbness in arms   [] Weakness or numbness in legs Musculoskeletal:  [x] Arthritis   [] Joint swelling   [] Joint pain   [] Low back pain Hematologic:  [] Easy bruising  [] Easy bleeding   [] Hypercoagulable state   [] Anemic  [] Hepatitis Gastrointestinal:  [] Blood in stool   [] Vomiting blood  [] Gastroesophageal reflux/heartburn   [] Abdominal pain Genitourinary:  [] Chronic kidney disease   [] Difficult urination  [] Frequent urination  [] Burning with urination   [] Hematuria Skin:  [] Rashes   [] Ulcers   [] Wounds Psychological:  [] History of anxiety   []  History of major depression.    Physical Exam BP (!) 141/103 (BP Location: Right Arm)   Pulse 84   Resp 17   Ht 5\' 6"  (1.676 m)   Wt 195 lb (88.5 kg)   BMI 31.47 kg/m  Gen:  WD/WN, NAD. Appears younger than stated age Head: [place]/AT, No temporalis wasting. Prominent temp pulse not noted. Ear/Nose/Throat: Hearing grossly intact, nares w/o erythema or drainage, oropharynx w/o Erythema/Exudate Eyes: Conjunctiva clear, sclera non-icteric  Neck: trachea midline.  No JVD.  Pulmonary:  Good air movement, no use of accessory muscles, respirations not labored Cardiac: Irregularly iregular Vascular:  Vessel Right Left  Radial Palpable Palpable  Ulnar Palpable Palpable  Brachial Palpable Palpable  Carotid Palpable, without bruit Palpable, without bruit  Aorta Not palpable N/A  Femoral Palpable Palpable  Popliteal Palpable Palpable  PT Palpable Palpable  DP Palpable Palpable   Gastrointestinal: soft, non-tender/non-distended.  Musculoskeletal: M/S 5/5 throughout.  Extremities without ischemic changes.  No deformity or atrophy. Neurologic: Sensation grossly intact in extremities.   Symmetrical.  Speech is fluent. Motor exam as listed above. Psychiatric: Judgment intact, Mood & affect appropriate for pt's clinical situation. Dermatologic: No rashes or ulcers noted.  No cellulitis or open wounds.    Radiology CT scan and [place] as above   Labs Recent Results (from the past 2160 hour(s))  Basic metabolic panel     Status: Abnormal   Collection Time: 12/09/16  8:20 AM  Result Value Ref Range   Glucose 96 65 - 99 mg/dL   BUN 20 8 - 27 mg/dL   Creatinine, Ser 1.22 (H) 0.57 - 1.00 mg/dL   GFR calc non Af Amer 43 (L) >59 mL/min/1.73   GFR calc Af Amer 49 (L) >59 mL/min/1.73   BUN/Creatinine Ratio  16 12 - 28   Sodium 143 134 - 144 mmol/L   Potassium 4.1 3.5 - 5.2 mmol/L   Chloride 101 96 - 106 mmol/L   CO2 24 18 - 29 mmol/L   Calcium 9.6 8.7 - 10.3 mg/dL  LP+ALT+AST [place], [place]     Status: Abnormal   Collection Time: 12/09/16  8:20 AM  Result Value Ref Range   ALT (SGPT) Piccolo, Waived 9 (L) 10 - 47 U/L   AST (SGOT) Piccolo, Waived 30 11 - 38 U/L   Cholesterol Piccolo, Waived 132 <200 mg/dL    Comment:                         Desirable                <200                         Borderline High      200- 239                         High                     >239    HDL Chol Piccolo, Waived 50 (L) >59 mg/dL    Comment:                         Low HDL- Risk Factor     < 40                         High HDL- Negative       > 59                          Risk Factor (Desirable)    Triglycerides Piccolo,Waived 109 <150 mg/dL    Comment:                         Normal                   <150                         Borderline High     150 - 199                         High                200 - 499                         Very High                >499    Chol/HDL Ratio Piccolo,Waive 2.6 mg/dL    Comment:                                        Female      Female                         Low Risk      < 5.0      <  4.5                         High Risk     > 4.9      >  4.4    LDL Chol Calc Piccolo Waived 60 <100 mg/dL    Comment:                         Optimal                  <100                         Near Optimal        100 - 129                         Borderline High     130 - 159                         High                160 - 189                         Very High                >189    VLDL Chol Calc Piccolo,Waive 22 <30 mg/dL    Comment:                         Normal                   < 30                         High                     > 29     Assessment/Plan:  Hyperlipidemia lipid control important in reducing the progression of atherosclerotic disease. Continue statin therapy   Atrial fibrillation (HCC) On anticoagulation  Benign essential HTN blood pressure control important in reducing the progression of atherosclerotic disease and aneurysmal degeneration. On appropriate oral medications.   Aortic calcification (HCC) Seen on CT scan. No symptoms of significant atherosclerotic occlusive disease associated with this.  Thoracic aortic aneurysm without rupture [facility]) Her pulmonologist however astutely noted that her ascending thoracic aorta was dilated and measured approximately 4.2-4.3 cm in maximal diameter. The remainder of the thoracic aorta identified was normal in caliber. She had a follow-up ultrasound of her abdominal aorta which demonstrated no abdominal aortic aneurysm. We had a lengthy discussion today regarding the pathophysiology and natural history of aneurysmal disease. At this size, she does not have any immediate risk of rupture and is very likely to be asymptomatic from this. I would certainly recommend surveillance of this, and given the small size would recommend the next check be in about 2 years. I have discussed the signs and symptoms of thoracic aortic aneurysms. I have discussed the reason and rationale for continued surveillance. The patient voices her understanding and is agreeable to proceed with our plan  of care      Leotis Pain 12/30/2016, 12:39 PM   This note was created with Dragon medical transcription system.  Any errors from  dictation are unintentional.

## 2016-12-30 NOTE — Assessment & Plan Note (Signed)
blood pressure control important in reducing the progression of atherosclerotic disease and aneurysmal degeneration. On appropriate oral medications.  

## 2017-05-14 ENCOUNTER — Telehealth: Payer: Self-pay | Admitting: Family Medicine

## 2017-05-14 NOTE — Telephone Encounter (Signed)
Called pt to schedule for Annual Wellness Visit with Nurse Health Advisor, Tiffany Hill, my c/b # is [phone]  Kathryn Brown ° °

## 2017-05-21 ENCOUNTER — Ambulatory Visit (INDEPENDENT_AMBULATORY_CARE_PROVIDER_SITE_OTHER): Payer: Medicare PPO

## 2017-05-21 VITALS — BP 158/100 | HR 93 | Temp 98.0°F | Resp 17 | Ht 64.0 in | Wt 193.8 lb

## 2017-05-21 DIAGNOSIS — Z0000 Encounter for general adult medical examination without abnormal findings: Secondary | ICD-10-CM | POA: Diagnosis not present

## 2017-05-21 NOTE — Patient Instructions (Addendum)
Brittney Ramirez , Thank you for taking time to come for your Medicare Wellness Visit. I appreciate your ongoing commitment to your health goals. Please review the following plan we discussed and let me know if I can assist you in the future.   Screening recommendations/referrals: Colonoscopy: completed 04/28/2012, no longer required Mammogram: completed 07/17/2016, no longer required Bone Density: completed 07/17/2016 Recommended yearly ophthalmology/optometry visit for glaucoma screening and checkup Recommended yearly dental visit for hygiene and checkup  Vaccinations: Influenza vaccine: up to date  Pneumococcal vaccine: up to date Tdap vaccine: up to date Shingles vaccine: up to date     Advanced directives: Please bring a copy of your health care power of attorney and living will to the office at your convenience.  Conditions/risks identified:Recommend drinking at least 4-5 glasses of water daily  Next appointment: Follow up on 06/11/2017 at 8:30am with Dr.Crissman. Follow up in one year for your annual wellness exam.    Preventive Care 65 Years and Older, Female Preventive care refers to lifestyle choices and visits with your health care provider that can promote health and wellness. What does preventive care include?  A yearly physical exam. This is also called an annual well check.  Dental exams once or twice a year.  Routine eye exams. Ask your health care provider how often you should have your eyes checked.  Personal lifestyle choices, including:  Daily care of your teeth and gums.  Regular physical activity.  Eating a healthy diet.  Avoiding tobacco and drug use.  Limiting alcohol use.  Practicing safe sex.  Taking low-dose aspirin every day.  Taking vitamin and mineral supplements as recommended by your health care provider. What happens during an annual well check? The services and screenings done by your health care provider during your annual well check will  depend on your age, overall health, lifestyle risk factors, and family history of disease. Counseling  Your health care provider may ask you questions about your:  Alcohol use.  Tobacco use.  Drug use.  Emotional well-being.  Home and relationship well-being.  Sexual activity.  Eating habits.  History of falls.  Memory and ability to understand (cognition).  Work and work Statistician.  Reproductive health. Screening  You may have the following tests or measurements:  Height, weight, and BMI.  Blood pressure.  Lipid and cholesterol levels. These may be checked every 5 years, or more frequently if you are over 14 years old.  Skin check.  Lung cancer screening. You may have this screening every year starting at age 34 if you have a 30-pack-year history of smoking and currently smoke or have quit within the past 15 years.  Fecal occult blood test (FOBT) of the stool. You may have this test every year starting at age 34.  Flexible sigmoidoscopy or colonoscopy. You may have a sigmoidoscopy every 5 years or a colonoscopy every 10 years starting at age 8.  Hepatitis C blood test.  Hepatitis B blood test.  Sexually transmitted disease (STD) testing.  Diabetes screening. This is done by checking your blood sugar (glucose) after you have not eaten for a while (fasting). You may have this done every 1-3 years.  Bone density scan. This is done to screen for osteoporosis. You may have this done starting at age 56.  Mammogram. This may be done every 1-2 years. Talk to your health care provider about how often you should have regular mammograms. Talk with your health care provider about your test results, treatment options,  and if necessary, the need for more tests. Vaccines  Your health care provider may recommend certain vaccines, such as:  Influenza vaccine. This is recommended every year.  Tetanus, diphtheria, and acellular pertussis (Tdap, Td) vaccine. You may need a  Td booster every 10 years.  Zoster vaccine. You may need this after age 2.  Pneumococcal 13-valent conjugate (PCV13) vaccine. One dose is recommended after age 2.  Pneumococcal polysaccharide (PPSV23) vaccine. One dose is recommended after age 38. Talk to your health care provider about which screenings and vaccines you need and how often you need them. This information is not intended to replace advice given to you by your health care provider. Make sure you discuss any questions you have with your health care provider. Document Released: 08/31/2015 Document Revised: 04/23/2016 Document Reviewed: 06/05/2015 Elsevier Interactive Patient Education  2017 [place] Prevention in the Home Falls can cause injuries. They can happen to people of all ages. There are many things you can do to make your home safe and to help prevent falls. What can I do on the outside of my home?  Regularly fix the edges of walkways and driveways and fix any cracks.  Remove anything that might make you trip as you walk through a door, such as a raised step or threshold.  Trim any bushes or trees on the path to your home.  Use bright outdoor lighting.  Clear any walking paths of anything that might make someone trip, such as rocks or tools.  Regularly check to see if handrails are loose or broken. Make sure that both sides of any steps have handrails.  Any raised decks and porches should have guardrails on the edges.  Have any leaves, snow, or ice cleared regularly.  Use sand or salt on walking paths during winter.  Clean up any spills in your garage right away. This includes oil or grease spills. What can I do in the bathroom?  Use night lights.  Install grab bars by the toilet and in the tub and shower. Do not use towel bars as grab bars.  Use non-skid mats or decals in the tub or shower.  If you need to sit down in the shower, use a plastic, non-slip stool.  Keep the floor dry. Clean  up any water that spills on the floor as soon as it happens.  Remove soap buildup in the tub or shower regularly.  Attach bath mats securely with double-sided non-slip rug tape.  Do not have throw rugs and other things on the floor that can make you trip. What can I do in the bedroom?  Use night lights.  Make sure that you have a light by your bed that is easy to reach.  Do not use any sheets or blankets that are too big for your bed. They should not hang down onto the floor.  Have a firm chair that has side arms. You can use this for support while you get dressed.  Do not have throw rugs and other things on the floor that can make you trip. What can I do in the kitchen?  Clean up any spills right away.  Avoid walking on wet floors.  Keep items that you use a lot in easy-to-reach places.  If you need to reach something above you, use a strong step stool that has a grab bar.  Keep electrical cords out of the way.  Do not use floor polish or wax that makes floors slippery. If  you must use wax, use non-skid floor wax.  Do not have throw rugs and other things on the floor that can make you trip. What can I do with my stairs?  Do not leave any items on the stairs.  Make sure that there are handrails on both sides of the stairs and use them. Fix handrails that are broken or loose. Make sure that handrails are as long as the stairways.  Check any carpeting to make sure that it is firmly attached to the stairs. Fix any carpet that is loose or worn.  Avoid having throw rugs at the top or bottom of the stairs. If you do have throw rugs, attach them to the floor with carpet tape.  Make sure that you have a light switch at the top of the stairs and the bottom of the stairs. If you do not have them, ask someone to add them for you. What else can I do to help prevent falls?  Wear shoes that:  Do not have high heels.  Have rubber bottoms.  Are comfortable and fit you well.  Are  closed at the toe. Do not wear sandals.  If you use a stepladder:  Make sure that it is fully opened. Do not climb a closed stepladder.  Make sure that both sides of the stepladder are locked into place.  Ask someone to hold it for you, if possible.  Clearly mark and make sure that you can see:  Any grab bars or handrails.  First and last steps.  Where the edge of each step is.  Use tools that help you move around (mobility aids) if they are needed. These include:  Canes.  Walkers.  Scooters.  Crutches.  Turn on the lights when you go into a dark area. Replace any light bulbs as soon as they burn out.  Set up your furniture so you have a clear path. Avoid moving your furniture around.  If any of your floors are uneven, fix them.  If there are any pets around you, be aware of where they are.  Review your medicines with your doctor. Some medicines can make you feel dizzy. This can increase your chance of falling. Ask your doctor what other things that you can do to help prevent falls. This information is not intended to replace advice given to you by your health care provider. Make sure you discuss any questions you have with your health care provider. Document Released: 05/31/2009 Document Revised: 01/10/2016 Document Reviewed: 09/08/2014 Elsevier Interactive Patient Education  2017 [employer].

## 2017-05-21 NOTE — Progress Notes (Cosign Needed)
Subjective:   Brittney Ramirez is a 76 y.o. female who presents for Medicare Annual (Subsequent) preventive examination.  Review of Systems:  Cardiac Risk Factors include: advanced age (>4men, >49 women);obesity (BMI >30kg/m2);dyslipidemia;hypertension     Objective:     Vitals: BP (!) 158/100 (BP Location: Left Arm, Patient Position: Sitting)   Pulse 93   Temp 98 F (36.7 C) (Oral)   Resp 17   Ht 5\' 4"  (1.626 m)   Wt 193 lb 12.8 oz (87.9 kg)   BMI 33.27 kg/m   Body mass index is 33.27 kg/m.   Tobacco History  Smoking Status  . Former Smoker  . Start date: 02/08/1955  . Quit date: 02/08/1955  Smokeless Tobacco  . Never Used     Counseling given: Not Answered   Past Medical History:  Diagnosis Date  . Arm fracture   . Atrial fibrillation ([employer])   . Chronic kidney disease   . Elevated glucose   . Hyperlipidemia   . Hypertension   . Jaw fracture ([place])   . Miscarriage   . Osteoporosis   . Sleep apnea   . Thyroid cyst    Past Surgical History:  Procedure Laterality Date  . CATARACT EXTRACTION Right   . CHOLECYSTECTOMY  2014  . JOINT REPLACEMENT Right 2003   Family History  Problem Relation Age of Onset  . Heart disease Mother   . Hypertension Mother   . Heart disease Father   . Hypertension Father   . Arthritis Sister   . Cancer Sister   . Diabetes Sister   . Heart disease Sister   . Breast cancer Sister 70  . Diabetes Brother   . Heart disease Brother   . Hypertension Brother    History  Sexual Activity  . Sexual activity: Not on file    Outpatient Encounter Prescriptions as of 05/21/2017  Medication Sig  . atorvastatin (LIPITOR) 40 MG tablet Take 1 tablet (40 mg total) by mouth at bedtime.  . calcium carbonate (OS-CAL) 600 MG TABS tablet Take 600 mg by mouth 2 (two) times daily with a meal.  . latanoprost (XALATAN) 0.005 % ophthalmic solution   . levothyroxine (SYNTHROID, LEVOTHROID) 88 MCG tablet Take 1 tablet (88 mcg total) by mouth daily  before breakfast.  . metoprolol tartrate (LOPRESSOR) 25 MG tablet Take 1 tablet (25 mg total) by mouth 3 (three) times daily.  Marland Kitchen omeprazole (PRILOSEC) 20 MG capsule Take 1 capsule (20 mg total) by mouth daily.  . prednisoLONE acetate (PRED FORTE) 1 % ophthalmic suspension Apply to eye.  . valsartan-hydrochlorothiazide (DIOVAN-HCT) 160-25 MG tablet Take 1 tablet by mouth daily.  [name] 15 MG TABS tablet 20 mg daily.    No facility-administered encounter medications on file as of 05/21/2017.     Activities of Daily Living In your present state of health, do you have any difficulty performing the following activities: 05/21/2017 06/10/2016  Hearing? N N  Vision? [facility] for left eye -  Difficulty concentrating or making decisions? N N  Walking or climbing stairs? N N  Dressing or bathing? N N  Doing errands, shopping? N N  Preparing Food and eating ? N -  Using the Toilet? N -  In the past six months, have you accidently leaked urine? N -  Do you have problems with loss of bowel control? Y -  Comment pads when out of home -  Managing your Medications? N -  Managing your Finances? N -  Housekeeping or managing your Housekeeping? N -  Some recent data might be hidden    Patient Care Team: Guadalupe Maple, MD as PCP - General (Family Medicine) Corey Skains, MD as Consulting Physician (Internal Medicine) Manya Silvas, MD (Gastroenterology) Erby Pian, MD as Referring Physician (Specialist) Dorna Bloom, MD as Referring Physician (Gastroenterology)    Assessment:    Exercise Activities and Dietary recommendations Current Exercise Habits: Home exercise routine, Type of exercise: walking, Time (Minutes): 30, Frequency (Times/Week): 2, Weekly Exercise (Minutes/Week): 60, Intensity: Mild, Exercise limited by: None identified  Goals    . Increase water intake          Recommend drinking at least 4-5 glasses of water daily       Fall Risk Fall Risk  05/21/2017 12/09/2016 06/10/2016 06/07/2015  Falls in the past year? Yes No No Yes  Number falls in past yr: 1 - - 2 or more  Injury with Fall? No - - Yes  Comment went to ED to be checked for injuries - - -  Follow up Falls prevention discussed - - -   Depression Screen PHQ 2/9 Scores 05/21/2017 12/09/2016 06/10/2016 06/07/2015  PHQ - 2 Score 0 0 0 0     Cognitive Function     6CIT Screen 05/21/2017  What Year? 0 points  What month? 0 points  What time? 0 points  Count back from 20 0 points  Months in reverse 0 points  Repeat phrase 2 points  Total Score 2    Immunization History  Administered Date(s) Administered  . Hepatitis B 05/09/2015, 06/11/2015, 08/29/2015  . Influenza-Unspecified 05/08/2015, 05/21/2016, 05/18/2017  . Pneumococcal Conjugate-13 04/13/2014  . Pneumococcal Polysaccharide-23 02/25/2004, 01/28/2007  . Td 02/04/2005  . Tdap 03/25/2011  . Zoster 01/26/2006   Screening Tests Health Maintenance  Topic Date Due  . TETANUS/TDAP  03/24/2021  . INFLUENZA VACCINE  Completed  . DEXA SCAN  Completed  . PNA vac Low Risk Adult  Completed  Dictation #1 [id number]  [id number]     Plan:     I have personally reviewed and addressed the Medicare Annual Wellness questionnaire and have noted the following in the patient's chart:  A. Medical and social history B. Use of alcohol, tobacco or illicit drugs  C. Current medications and supplements D. Functional ability and status E.  Nutritional status F.  Physical activity G. Advance directives H. List of other physicians I.  Hospitalizations, surgeries, and ER visits in previous 12 months J.  Home such as hearing and vision if needed, cognitive and depression L. Referrals and appointments  In addition, I have reviewed and discussed with patient certain preventive protocols, quality metrics, and best practice recommendations. A written personalized care plan for  preventive services as well as general preventive health recommendations were provided to patient.   Signed,  Tyler Aas, LPN Nurse Health Advisor   MD Recommendations:none

## 2017-06-10 ENCOUNTER — Other Ambulatory Visit: Payer: Self-pay | Admitting: Family Medicine

## 2017-06-11 ENCOUNTER — Ambulatory Visit (INDEPENDENT_AMBULATORY_CARE_PROVIDER_SITE_OTHER): Payer: Medicare PPO | Admitting: Family Medicine

## 2017-06-11 ENCOUNTER — Encounter: Payer: Self-pay | Admitting: Family Medicine

## 2017-06-11 VITALS — BP 133/84 | HR 75 | Wt 199.0 lb

## 2017-06-11 DIAGNOSIS — Z7189 Other specified counseling: Secondary | ICD-10-CM

## 2017-06-11 DIAGNOSIS — E782 Mixed hyperlipidemia: Secondary | ICD-10-CM | POA: Diagnosis not present

## 2017-06-11 DIAGNOSIS — E039 Hypothyroidism, unspecified: Secondary | ICD-10-CM

## 2017-06-11 DIAGNOSIS — I132 Hypertensive heart and chronic kidney disease with heart failure and with stage 5 chronic kidney disease, or end stage renal disease: Secondary | ICD-10-CM

## 2017-06-11 DIAGNOSIS — I700 Atherosclerosis of aorta: Secondary | ICD-10-CM | POA: Diagnosis not present

## 2017-06-11 DIAGNOSIS — N19 Unspecified kidney failure: Secondary | ICD-10-CM

## 2017-06-11 DIAGNOSIS — I10 Essential (primary) hypertension: Secondary | ICD-10-CM | POA: Diagnosis not present

## 2017-06-11 DIAGNOSIS — N183 Chronic kidney disease, stage 3 unspecified: Secondary | ICD-10-CM

## 2017-06-11 DIAGNOSIS — I5022 Chronic systolic (congestive) heart failure: Secondary | ICD-10-CM

## 2017-06-11 DIAGNOSIS — Z0000 Encounter for general adult medical examination without abnormal findings: Secondary | ICD-10-CM

## 2017-06-11 DIAGNOSIS — I712 Thoracic aortic aneurysm, without rupture, unspecified: Secondary | ICD-10-CM

## 2017-06-11 DIAGNOSIS — I482 Chronic atrial fibrillation, unspecified: Secondary | ICD-10-CM

## 2017-06-11 LAB — URINALYSIS, ROUTINE W REFLEX MICROSCOPIC
Bilirubin, UA: NEGATIVE
GLUCOSE, UA: NEGATIVE
Ketones, UA: NEGATIVE
Nitrite, UA: NEGATIVE
PH UA: 7 (ref 5.0–7.5)
PROTEIN UA: NEGATIVE
Specific Gravity, UA: 1.01 (ref 1.005–1.030)
Urobilinogen, Ur: 0.2 mg/dL (ref 0.2–1.0)

## 2017-06-11 LAB — MICROSCOPIC EXAMINATION

## 2017-06-11 MED ORDER — LEVOTHYROXINE SODIUM 88 MCG PO TABS: 88.0000 ug | ORAL_TABLET | Freq: Every day | ORAL | 4 refills | Status: DC

## 2017-06-11 MED ORDER — ATORVASTATIN CALCIUM 40 MG PO TABS: 40.0000 mg | ORAL_TABLET | Freq: Every day | ORAL | 4 refills | Status: DC

## 2017-06-11 MED ORDER — METOPROLOL TARTRATE 25 MG PO TABS: 25.0000 mg | ORAL_TABLET | Freq: Three times a day (TID) | ORAL | 4 refills | Status: DC

## 2017-06-11 MED ORDER — VALSARTAN-HYDROCHLOROTHIAZIDE 160-25 MG PO TABS: 1.0000 | ORAL_TABLET | Freq: Every day | ORAL | 4 refills | Status: DC

## 2017-06-11 MED ORDER — OMEPRAZOLE 20 MG PO CPDR: 20.0000 mg | DELAYED_RELEASE_CAPSULE | Freq: Every day | ORAL | 4 refills | Status: DC

## 2017-06-11 NOTE — Progress Notes (Signed)
BP 133/84   Pulse 75   Wt 199 lb (90.3 kg)   SpO2 98%   BMI 34.16 kg/m    Subjective:    Patient ID: Brittney Ramirez, female    DOB: 1940-12-04, 76 y.o.   MRN: [id number]  HPI: Brittney Ramirez is a 76 y.o. female  Annual Exam Patient all in all doing well has had a scary medically spring with diagnosis of thoracic aneurysm but on further review with very small and stable has recommended follow-up in a couple of years. Cardiology having stable reports with heart failure. Kidney disease has remained stable. Hypertension stable with good control. Cholesterol also stable and no problems from medications with taking faithfully.   Relevant past medical, surgical, family and social history reviewed and updated as indicated. Interim medical history since our last visit reviewed. Allergies and medications reviewed and updated.  Review of Systems  Constitutional: Negative.   HENT: Negative.   Eyes: Negative.   Respiratory: Negative.   Cardiovascular: Negative.   Gastrointestinal: Negative.   Endocrine: Negative.   Genitourinary: Negative.   Musculoskeletal: Negative.   Skin: Negative.   Allergic/Immunologic: Negative.   Neurological: Negative.   Hematological: Negative.   Psychiatric/Behavioral: Negative.     Per HPI unless specifically indicated above     Objective:    BP 133/84   Pulse 75   Wt 199 lb (90.3 kg)   SpO2 98%   BMI 34.16 kg/m   Wt Readings from Last 3 Encounters:  06/11/17 199 lb (90.3 kg)  05/21/17 193 lb 12.8 oz (87.9 kg)  12/30/16 195 lb (88.5 kg)    Physical Exam  Constitutional: She is oriented to person, place, and time. She appears well-developed and well-nourished.  HENT:  Head: Normocephalic and atraumatic.  Right Ear: External ear normal.  Left Ear: External ear normal.  Nose: Nose normal.  Mouth/Throat: Oropharynx is clear and moist.  Eyes: Pupils are equal, round, and reactive to light. Conjunctivae and EOM are normal.  Neck: Normal  range of motion. Neck supple. Carotid bruit is not present.  Cardiovascular: Normal rate, regular rhythm and normal heart sounds.   No murmur heard. Pulmonary/Chest: Effort normal and breath sounds normal. She exhibits no mass. Right breast exhibits no mass, no skin change and no tenderness. Left breast exhibits no mass, no skin change and no tenderness. Breasts are symmetrical.  Abdominal: Soft. Bowel sounds are normal. There is no hepatosplenomegaly.  Musculoskeletal: Normal range of motion.  Neurological: She is alert and oriented to person, place, and time.  Skin: No rash noted.  Psychiatric: She has a normal mood and affect. Her behavior is normal. Judgment and thought content normal.    Results for orders placed or performed in visit on 87/56/43  Basic metabolic panel  Result Value Ref Range   Glucose 96 65 - 99 mg/dL   BUN 20 8 - 27 mg/dL   Creatinine, Ser 1.22 (H) 0.57 - 1.00 mg/dL   GFR calc non Af Amer 43 (L) >59 mL/min/1.73   GFR calc Af Amer 49 (L) >59 mL/min/1.73   BUN/Creatinine Ratio 16 12 - 28   Sodium 143 134 - 144 mmol/L   Potassium 4.1 3.5 - 5.2 mmol/L   Chloride 101 96 - 106 mmol/L   CO2 24 18 - 29 mmol/L   Calcium 9.6 8.7 - 10.3 mg/dL  LP+ALT+AST Piccolo, Waived  Result Value Ref Range   ALT (SGPT) Piccolo, Waived 9 (L) 10 - 47 U/L  AST (SGOT) Piccolo, Waived 30 11 - 38 U/L   Cholesterol Piccolo, Waived 132 <200 mg/dL   HDL Chol Piccolo, Waived 50 (L) >59 mg/dL   Triglycerides Piccolo,Waived 109 <150 mg/dL   Chol/HDL Ratio Piccolo,Waive 2.6 mg/dL   LDL Chol Calc Piccolo Waived 60 <100 mg/dL   VLDL Chol Calc Piccolo,Waive 22 <30 mg/dL      Assessment & Plan:   Problem List Items Addressed This Visit      Cardiovascular and Mediastinum   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 No problems with meds       Relevant Medications   atorvastatin (LIPITOR) 40 MG tablet   metoprolol tartrate (LOPRESSOR) 25 MG tablet   valsartan-hydrochlorothiazide (DIOVAN-HCT) 160-25 MG tablet   Benign essential HTN - Primary   Relevant Medications   atorvastatin (LIPITOR) 40 MG tablet   metoprolol tartrate (LOPRESSOR) 25 MG tablet   valsartan-hydrochlorothiazide (DIOVAN-HCT) 160-25 MG tablet   Other Relevant Orders   CBC with Differential/Platelet   Urinalysis, Routine w reflex microscopic   Thoracic aortic aneurysm without rupture (HCC)    Stable on review      Relevant Medications   atorvastatin (LIPITOR) 40 MG tablet   metoprolol tartrate (LOPRESSOR) 25 MG tablet   valsartan-hydrochlorothiazide (DIOVAN-HCT) 160-25 MG tablet   Aortic calcification (HCC)    All stable      Relevant Medications   atorvastatin (LIPITOR) 40 MG tablet   metoprolol tartrate (LOPRESSOR) 25 MG tablet   valsartan-hydrochlorothiazide (DIOVAN-HCT) 160-25 MG tablet     Endocrine   Hypothyroidism   Relevant Medications   metoprolol tartrate (LOPRESSOR) 25 MG tablet   levothyroxine (SYNTHROID, LEVOTHROID) 88 MCG tablet   Other Relevant Orders   TSH     Genitourinary   CKD (chronic kidney disease) stage 3, GFR 30-59 ml/min (HCC)   Relevant Orders   Comprehensive metabolic panel   Urinalysis, Routine w reflex microscopic     Other   Hyperlipidemia   Relevant Medications   atorvastatin (LIPITOR) 40 MG tablet   metoprolol tartrate  (LOPRESSOR) 25 MG tablet   valsartan-hydrochlorothiazide (DIOVAN-HCT) 160-25 MG tablet   Other Relevant Orders   Lipid panel   Advanced care planning/counseling discussion    A voluntary discussion about advance care planning including the explanation and discussion of advance directives was extensively discussed  with the patient.  Explanation about the health care proxy and Living will was reviewed and packet with forms with explanation of how to fill them out was given. We have copies.       Other Visit Diagnoses    PE (physical exam), annual           Follow up plan: Return in about 6 months (around 12/10/2017) for BMP,  Lipids, ALT, AST.

## 2017-06-11 NOTE — Assessment & Plan Note (Signed)
All stable

## 2017-06-11 NOTE — Assessment & Plan Note (Signed)
Stable on review

## 2017-06-11 NOTE — Assessment & Plan Note (Signed)
A voluntary discussion about advance care planning including the explanation and discussion of advance directives was extensively discussed  with the patient.  Explanation about the health care proxy and Living will was reviewed and packet with forms with explanation of how to fill them out was given. We have copies.

## 2017-06-11 NOTE — Assessment & Plan Note (Signed)
Followed by cardiology °stable °

## 2017-06-11 NOTE — Assessment & Plan Note (Addendum)
The current medical regimen is effective;  continue present plan and medications. No problems with meds

## 2017-06-12 LAB — CBC WITH DIFFERENTIAL/PLATELET
BASOS: 1 %
Basophils Absolute: 0.1 10*3/uL (ref 0.0–0.2)
EOS (ABSOLUTE): 1.5 10*3/uL — AB (ref 0.0–0.4)
EOS: 20 %
HEMATOCRIT: 40.6 % (ref 34.0–46.6)
HEMOGLOBIN: 13.5 g/dL (ref 11.1–15.9)
IMMATURE GRANS (ABS): 0 10*3/uL (ref 0.0–0.1)
IMMATURE GRANULOCYTES: 0 %
LYMPHS: 24 %
Lymphocytes Absolute: 1.8 10*3/uL (ref 0.7–3.1)
MCH: 31.3 pg (ref 26.6–33.0)
MCHC: 33.3 g/dL (ref 31.5–35.7)
MCV: 94 fL (ref 79–97)
Monocytes Absolute: 0.6 10*3/uL (ref 0.1–0.9)
Monocytes: 8 %
Neutrophils Absolute: 3.6 10*3/uL (ref 1.4–7.0)
Neutrophils: 47 %
Platelets: 183 10*3/uL (ref 150–379)
RBC: 4.32 x10E6/uL (ref 3.77–5.28)
RDW: 15.5 % — AB (ref 12.3–15.4)
WBC: 7.4 10*3/uL (ref 3.4–10.8)

## 2017-06-12 LAB — COMPREHENSIVE METABOLIC PANEL
ALT: 11 IU/L (ref 0–32)
AST: 23 IU/L (ref 0–40)
Albumin/Globulin Ratio: 1.6 (ref 1.2–2.2)
Albumin: 4.3 g/dL (ref 3.5–4.8)
Alkaline Phosphatase: 68 IU/L (ref 39–117)
BUN/Creatinine Ratio: 17 (ref 12–28)
BUN: 20 mg/dL (ref 8–27)
Bilirubin Total: 0.5 mg/dL (ref 0.0–1.2)
CALCIUM: 10.1 mg/dL (ref 8.7–10.3)
CO2: 25 mmol/L (ref 20–29)
Chloride: 102 mmol/L (ref 96–106)
Creatinine, Ser: 1.15 mg/dL — ABNORMAL HIGH (ref 0.57–1.00)
GFR calc Af Amer: 53 mL/min/{1.73_m2} — ABNORMAL LOW (ref >59)
GFR, EST NON AFRICAN AMERICAN: 46 mL/min/{1.73_m2} — AB (ref >59)
GLOBULIN, TOTAL: 2.7 g/dL (ref 1.5–4.5)
GLUCOSE: 90 mg/dL (ref 65–99)
Potassium: 4.6 mmol/L (ref 3.5–5.2)
SODIUM: 144 mmol/L (ref 134–144)
Total Protein: 7 g/dL (ref 6.0–8.5)

## 2017-06-12 LAB — LIPID PANEL
CHOLESTEROL TOTAL: 156 mg/dL (ref 100–199)
Chol/HDL Ratio: 3.2 ratio (ref 0.0–4.4)
HDL: 49 mg/dL (ref >39)
LDL Calculated: 77 mg/dL (ref 0–99)
TRIGLYCERIDES: 149 mg/dL (ref 0–149)
VLDL CHOLESTEROL CAL: 30 mg/dL (ref 5–40)

## 2017-06-12 LAB — TSH: TSH: 8.21 u[IU]/mL — AB (ref 0.450–4.500)

## 2017-06-15 ENCOUNTER — Encounter: Payer: Self-pay | Admitting: Family Medicine

## 2017-06-15 ENCOUNTER — Telehealth: Payer: Self-pay | Admitting: Family Medicine

## 2017-06-15 DIAGNOSIS — R7989 Other specified abnormal findings of blood chemistry: Secondary | ICD-10-CM

## 2017-06-15 NOTE — Telephone Encounter (Signed)
-----   Message from Georgina Peer, [place] sent at 06/15/2017 11:45 AM EDT ----- Phone call.

## 2017-06-15 NOTE — Telephone Encounter (Signed)
Phone call Discussed with patient elevated TSH recheck TSH in 2 months or so

## 2017-06-18 ENCOUNTER — Other Ambulatory Visit: Payer: Self-pay | Admitting: Family Medicine

## 2017-06-18 DIAGNOSIS — Z1231 Encounter for screening mammogram for malignant neoplasm of breast: Secondary | ICD-10-CM

## 2017-07-01 ENCOUNTER — Other Ambulatory Visit: Payer: Medicare PPO

## 2017-07-01 DIAGNOSIS — R7989 Other specified abnormal findings of blood chemistry: Secondary | ICD-10-CM

## 2017-07-02 LAB — TSH: TSH: 5.5 u[IU]/mL — AB (ref 0.450–4.500)

## 2017-07-03 ENCOUNTER — Encounter: Payer: Self-pay | Admitting: Family Medicine

## 2017-07-20 ENCOUNTER — Ambulatory Visit
Admission: RE | Admit: 2017-07-20 | Discharge: 2017-07-20 | Disposition: A | Discharge status: Home / Self Care | Payer: Medicare PPO | Source: Ambulatory Visit | Attending: Family Medicine | Admitting: Family Medicine

## 2017-07-20 DIAGNOSIS — Z1231 Encounter for screening mammogram for malignant neoplasm of breast: Secondary | ICD-10-CM

## 2017-10-02 ENCOUNTER — Institutional Professional Consult (permissible substitution): Payer: Medicare PPO | Admitting: Internal Medicine

## 2017-10-14 ENCOUNTER — Ambulatory Visit: Payer: Medicare PPO | Admitting: Pulmonary Disease

## 2017-10-14 ENCOUNTER — Encounter: Payer: Self-pay | Admitting: Pulmonary Disease

## 2017-10-14 VITALS — BP 136/68 | HR 77 | Resp 16 | Ht 64.0 in | Wt 196.0 lb

## 2017-10-14 DIAGNOSIS — I482 Chronic atrial fibrillation, unspecified: Secondary | ICD-10-CM

## 2017-10-14 DIAGNOSIS — R0609 Other forms of dyspnea: Secondary | ICD-10-CM

## 2017-10-14 DIAGNOSIS — R911 Solitary pulmonary nodule: Secondary | ICD-10-CM | POA: Diagnosis not present

## 2017-10-14 DIAGNOSIS — E668 Other obesity: Secondary | ICD-10-CM | POA: Diagnosis not present

## 2017-10-14 NOTE — Progress Notes (Signed)
PULMONARY CONSULT NOTE  Requesting MD/Service: Self Date of initial consultation: 10/14/17 Reason for consultation: Lung nodule  PT PROFILE: 77 y.o. female with minimal smoking history, previously seen by Dr. Raul Del for lung nodule.  Requests second opinion.  HPI:  As above.  Minimal smoking history having quit in 1957.  She was incidentally found to have a lung nodule on CT scan of the abdomen.  This study was performed in 2016.  She had a subsequent CT scan of the chest in April 2017, again demonstrating the lung nodule.  A repeat CT scan in April 2018 again demonstrated the lung nodule which was no larger and perhaps slightly smaller than seen on previous study.  She has no major pulmonary complaints.  She does have mild to moderate exertional dyspnea.  This is not changed over several years.  In fact, it has improved since she has had some manipulations made in her cardiac regimen.  Dr. Raul Del perform spirometry in the office in 2017 which revealed no evidence of obstruction.  She denies unexplained weight loss.  She denies CP, fever, purulent sputum, hemoptysis, LE edema and calf tenderness.  She has no prior history of tuberculosis exposure.  Past Medical History:  Diagnosis Date  . Arm fracture   . Atrial fibrillation ([place])   . Chronic kidney disease   . Elevated glucose   . Hyperlipidemia   . Hypertension   . Jaw fracture ([place])   . Miscarriage   . Osteoporosis   . Sleep apnea   . Thyroid cyst     Past Surgical History:  Procedure Laterality Date  . CATARACT EXTRACTION Right   . CHOLECYSTECTOMY  2014  . JOINT REPLACEMENT Right 2003    MEDICATIONS: I have reviewed all medications and confirmed regimen as documented  Social History   Socioeconomic History  . Marital status: Married    Spouse name: Not on file  . Number of children: Not on file  . Years of education: Not on file  . Highest education level: Not on file  Social Needs  . Financial resource strain: Not  on file  . Food insecurity - worry: Not on file  . Food insecurity - inability: Not on file  . Transportation needs - medical: Not on file  . Transportation needs - non-medical: Not on file  Occupational History  . Not on file  Tobacco Use  . Smoking status: Former Smoker    Start date: 02/08/1955    Last attempt to quit: 02/08/1955    Years since quitting: 62.7  . Smokeless tobacco: Never Used  Substance and Sexual Activity  . Alcohol use: No    Alcohol/week: 0.0 oz  . Drug use: No  . Sexual activity: Not on file  Other Topics Concern  . Not on file  Social History Narrative  . Not on file    Family History  Problem Relation Age of Onset  . Heart disease Mother   . Hypertension Mother   . Heart disease Father   . Hypertension Father   . Arthritis Sister   . Cancer Sister   . Diabetes Sister   . Heart disease Sister   . Breast cancer Sister 76  . Diabetes Brother   . Heart disease Brother   . Hypertension Brother     ROS: No fever, myalgias/arthralgias, unexplained weight loss or weight gain No new focal weakness or sensory deficits No otalgia, hearing loss, visual changes, nasal and sinus symptoms, mouth and throat problems No neck  pain or adenopathy No abdominal pain, N/V/D, diarrhea, change in bowel pattern No dysuria, change in urinary pattern   Vitals:   10/14/17 0835 10/14/17 0840  BP:  136/68  Pulse:  77  Resp: 16   SpO2:  93%  Weight: 88.9 kg (196 lb)   Height: 5\' 4"  (1.626 m)      EXAM:  Gen: Moderately obese, pleasant, No overt respiratory distress HEENT: NCAT, sclera white, oropharynx normal Neck: Supple without LAN, thyromegaly, JVD Lungs: Breath sounds are full without wheezes or other adventitious sounds* Cardiovascular: IR IR, rate controlled, no murmurs noted Abdomen: Soft, nontender, normal BS Ext: without clubbing, cyanosis, edema Neuro: CNs grossly intact, motor and sensory intact Skin: Limited exam, no lesions noted  DATA:    BMP Latest Ref Rng & Units 06/11/2017 12/09/2016 06/10/2016  Glucose 65 - 99 mg/dL 90 96 96  BUN 8 - 27 mg/dL 20 20 16   Creatinine 0.57 - 1.00 mg/dL 1.15(H) 1.22(H) 1.34(H)  BUN/Creat Ratio 12 - 28 17 16 12   Sodium 134 - 144 mmol/L 144 143 143  Potassium 3.5 - 5.2 mmol/L 4.6 4.1 4.4  Chloride 96 - 106 mmol/L 102 101 101  CO2 20 - 29 mmol/L 25 24 24   Calcium 8.7 - 10.3 mg/dL 10.1 9.6 10.0    CBC Latest Ref Rng & Units 06/11/2017 06/10/2016 06/07/2015  WBC 3.4 - 10.8 x10E3/uL 7.4 7.7 7.6  Hemoglobin 11.1 - 15.9 g/dL 13.5 13.5 13.0  Hematocrit 34.0 - 46.6 % 40.6 40.3 40.1  Platelets 150 - 379 x10E3/uL 183 187 193    CXR: No recent film  CT chest 12/14/15/CT chest 11/28/16: His films were reviewed in the office with the patient.  She has scattered insignificant opacities, predominantly in the apices, likely representing minimal scarring.  She also has a left lower lobe nodule which did not change from 2017-2018.  In fact it might have regressed in size slightly.  Also of note, it was seen on CT abdomen in 2016 and again there is no change in size or evidence of growth.  IMPRESSION:     ICD-10-CM   1. Lung nodule R91.1   2. DOE (dyspnea on exertion) R06.09   3. Chronic atrial fibrillation (HCC) I48.2   4. Moderate obesity E66.8      PLAN:  The lung nodule has been stable for at least a 2 or 3-year period of time.  The absence of growth indicates that it is a benign nodule and requires no further follow-up.  Her exertional dyspnea is well explained by chronic atrial fibrillation and moderate obesity.  No further follow-up is warranted at this time.  She may call our office for any pulmonary, respiratory or chest problems.   Merton Border, MD PCCM service Mobile [phone] Pager [phone] 10/14/2017 9:25 AM

## 2017-10-14 NOTE — Patient Instructions (Signed)
You have a very small lung nodule (spot) in the lower part of your left lung which actually got smaller from 2017-2018.  Therefore, we know it is not cancer and is not anything to worry about.  It does not require any further follow-up.  Follow-up as needed for any breathing, lungs or respiratory problem.

## 2017-12-10 ENCOUNTER — Encounter: Payer: Self-pay | Admitting: Family Medicine

## 2017-12-10 ENCOUNTER — Ambulatory Visit (INDEPENDENT_AMBULATORY_CARE_PROVIDER_SITE_OTHER): Payer: Medicare PPO | Admitting: Family Medicine

## 2017-12-10 VITALS — BP 136/82 | HR 94 | Ht 64.0 in | Wt 190.0 lb

## 2017-12-10 DIAGNOSIS — I712 Thoracic aortic aneurysm, without rupture, unspecified: Secondary | ICD-10-CM

## 2017-12-10 DIAGNOSIS — I132 Hypertensive heart and chronic kidney disease with heart failure and with stage 5 chronic kidney disease, or end stage renal disease: Secondary | ICD-10-CM | POA: Diagnosis not present

## 2017-12-10 DIAGNOSIS — E782 Mixed hyperlipidemia: Secondary | ICD-10-CM | POA: Diagnosis not present

## 2017-12-10 DIAGNOSIS — I700 Atherosclerosis of aorta: Secondary | ICD-10-CM | POA: Diagnosis not present

## 2017-12-10 DIAGNOSIS — N19 Unspecified kidney failure: Secondary | ICD-10-CM

## 2017-12-10 LAB — LP+ALT+AST PICCOLO, WAIVED
ALT (SGPT) Piccolo, Waived: 22 U/L (ref 10–47)
AST (SGOT) Piccolo, Waived: 33 U/L (ref 11–38)
CHOL/HDL RATIO PICCOLO,WAIVE: 2.7 mg/dL
Cholesterol Piccolo, Waived: 149 mg/dL (ref <200)
HDL Chol Piccolo, Waived: 54 mg/dL — ABNORMAL LOW (ref >59)
LDL Chol Calc Piccolo Waived: 68 mg/dL (ref <100)
TRIGLYCERIDES PICCOLO,WAIVED: 133 mg/dL (ref <150)
VLDL CHOL CALC PICCOLO,WAIVE: 27 mg/dL (ref <30)

## 2017-12-10 NOTE — Assessment & Plan Note (Signed)
The current medical regimen is effective;  continue present plan and medications.  

## 2017-12-10 NOTE — Assessment & Plan Note (Signed)
stable °

## 2017-12-10 NOTE — Progress Notes (Signed)
   BP 136/82   Pulse 94   Ht 5\' 4"  (1.626 m)   Wt 190 lb (86.2 kg)   SpO2 98%   BMI 32.61 kg/m    Subjective:    Patient ID: Brittney Ramirez, female    DOB: 08-30-1940, 77 y.o.   MRN: [id number]  HPI: Brittney Ramirez is a 77 y.o. female  Chief Complaint  Patient presents with  . Follow-up  . Hypertension  Follow-up blood pressure all in all doing well with blood pressure blood pressure medications. Patient going through some stress and strain with possibly impending left cornea transplant.  Other medication doing okay with thyroid.  Taken Xarelto 20 mg 1 a day without problems from cardiology. Also taking cholesterol medications without problems.  Relevant past medical, surgical, family and social history reviewed and updated as indicated. Interim medical history since our last visit reviewed. Allergies and medications reviewed and updated.  Review of Systems  Constitutional: Negative.   Respiratory: Negative.   Cardiovascular: Negative.     Per HPI unless specifically indicated above     Objective:    BP 136/82   Pulse 94   Ht 5\' 4"  (1.626 m)   Wt 190 lb (86.2 kg)   SpO2 98%   BMI 32.61 kg/m   Wt Readings from Last 3 Encounters:  12/10/17 190 lb (86.2 kg)  10/14/17 196 lb (88.9 kg)  06/11/17 199 lb (90.3 kg)    Physical Exam  Constitutional: She is oriented to person, place, and time. She appears well-developed and well-nourished.  HENT:  Head: Normocephalic and atraumatic.  Eyes: Conjunctivae and EOM are normal.  Neck: Normal range of motion.  Cardiovascular: Normal rate, regular rhythm and normal heart sounds.  Pulmonary/Chest: Effort normal and breath sounds normal.  Musculoskeletal: Normal range of motion.  Neurological: She is alert and oriented to person, place, and time.  Skin: No erythema.  Psychiatric: She has a normal mood and affect. Her behavior is normal. Judgment and thought content normal.    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       Relevant Orders   Basic metabolic panel   LP+ALT+AST Piccolo, Waived   Thoracic aortic aneurysm without rupture ([place])    Stable followed by vascular      Aortic calcification ([place])    stable        Other   Hyperlipidemia    The current medical regimen is effective;  continue present plan and medications.       Relevant Orders   Basic metabolic panel   LP+ALT+AST Piccolo, Waived       Follow up plan: Return in about 6 months (around 06/11/2018) for Physical Exam.

## 2017-12-10 NOTE — Assessment & Plan Note (Signed)
Stable followed by vascular.  

## 2017-12-11 LAB — BASIC METABOLIC PANEL
BUN/Creatinine Ratio: 17 (ref 12–28)
BUN: 30 mg/dL — ABNORMAL HIGH (ref 8–27)
CO2: 24 mmol/L (ref 20–29)
Calcium: 10 mg/dL (ref 8.7–10.3)
Chloride: 103 mmol/L (ref 96–106)
Creatinine, Ser: 1.81 mg/dL — ABNORMAL HIGH (ref 0.57–1.00)
GFR calc Af Amer: 30 mL/min/{1.73_m2} — ABNORMAL LOW (ref >59)
GFR calc non Af Amer: 26 mL/min/{1.73_m2} — ABNORMAL LOW (ref >59)
Glucose: 88 mg/dL (ref 65–99)
Potassium: 4.7 mmol/L (ref 3.5–5.2)
Sodium: 144 mmol/L (ref 134–144)

## 2017-12-14 ENCOUNTER — Telehealth: Payer: Self-pay | Admitting: Family Medicine

## 2017-12-14 DIAGNOSIS — N184 Chronic kidney disease, stage 4 (severe): Secondary | ICD-10-CM

## 2017-12-14 NOTE — Telephone Encounter (Signed)
Phone call Discussed with patient significant decline in renal function.  Patient denies use of nephrotoxic agents. Patient can come back tomorrow morning for repeat BMP.

## 2017-12-14 NOTE — Telephone Encounter (Signed)
-----   Message from Amada Kingfisher, [place] sent at 12/14/2017 11:38 AM EDT ----- Patient was transferred to provider for telephone conversation.

## 2017-12-15 ENCOUNTER — Other Ambulatory Visit: Payer: Medicare PPO

## 2017-12-15 DIAGNOSIS — N184 Chronic kidney disease, stage 4 (severe): Secondary | ICD-10-CM

## 2017-12-16 LAB — BASIC METABOLIC PANEL
BUN/Creatinine Ratio: 18 (ref 12–28)
BUN: 24 mg/dL (ref 8–27)
CHLORIDE: 103 mmol/L (ref 96–106)
CO2: 23 mmol/L (ref 20–29)
CREATININE: 1.33 mg/dL — AB (ref 0.57–1.00)
Calcium: 9.6 mg/dL (ref 8.7–10.3)
GFR calc Af Amer: 44 mL/min/{1.73_m2} — ABNORMAL LOW (ref >59)
GFR calc non Af Amer: 38 mL/min/{1.73_m2} — ABNORMAL LOW (ref >59)
GLUCOSE: 96 mg/dL (ref 65–99)
POTASSIUM: 4.3 mmol/L (ref 3.5–5.2)
SODIUM: 141 mmol/L (ref 134–144)

## 2017-12-17 ENCOUNTER — Telehealth: Payer: Self-pay | Admitting: Family Medicine

## 2017-12-17 DIAGNOSIS — N184 Chronic kidney disease, stage 4 (severe): Secondary | ICD-10-CM

## 2017-12-17 NOTE — Telephone Encounter (Signed)
Phone call Discussed with patient renal function has improved somewhat but still showing marked decline because of acute changes will refer to nephrology to further evaluate.

## 2017-12-17 NOTE — Telephone Encounter (Signed)
-----   Message from Brittney Ramirez, [place] sent at 12/17/2017 12:07 PM EDT ----- Patient was transferred to provider for telephone conversation.

## 2017-12-22 ENCOUNTER — Emergency Department: Payer: Medicare PPO

## 2017-12-22 ENCOUNTER — Emergency Department
Admission: EM | Admit: 2017-12-22 | Discharge: 2017-12-22 | Disposition: A | Discharge status: Home / Self Care | Payer: Medicare PPO | Attending: Emergency Medicine | Admitting: Emergency Medicine

## 2017-12-22 ENCOUNTER — Encounter: Payer: Self-pay | Admitting: Emergency Medicine

## 2017-12-22 ENCOUNTER — Other Ambulatory Visit: Payer: Self-pay

## 2017-12-22 DIAGNOSIS — Y999 Unspecified external cause status: Secondary | ICD-10-CM | POA: Diagnosis not present

## 2017-12-22 DIAGNOSIS — Y939 Activity, unspecified: Secondary | ICD-10-CM | POA: Insufficient documentation

## 2017-12-22 DIAGNOSIS — S2242XA Multiple fractures of ribs, left side, initial encounter for closed fracture: Secondary | ICD-10-CM

## 2017-12-22 DIAGNOSIS — E039 Hypothyroidism, unspecified: Secondary | ICD-10-CM | POA: Insufficient documentation

## 2017-12-22 DIAGNOSIS — Y929 Unspecified place or not applicable: Secondary | ICD-10-CM | POA: Diagnosis not present

## 2017-12-22 DIAGNOSIS — Z87891 Personal history of nicotine dependence: Secondary | ICD-10-CM | POA: Diagnosis not present

## 2017-12-22 DIAGNOSIS — I130 Hypertensive heart and chronic kidney disease with heart failure and stage 1 through stage 4 chronic kidney disease, or unspecified chronic kidney disease: Secondary | ICD-10-CM | POA: Insufficient documentation

## 2017-12-22 DIAGNOSIS — Z7901 Long term (current) use of anticoagulants: Secondary | ICD-10-CM | POA: Insufficient documentation

## 2017-12-22 DIAGNOSIS — N183 Chronic kidney disease, stage 3 (moderate): Secondary | ICD-10-CM | POA: Insufficient documentation

## 2017-12-22 DIAGNOSIS — I5022 Chronic systolic (congestive) heart failure: Secondary | ICD-10-CM | POA: Diagnosis not present

## 2017-12-22 DIAGNOSIS — W010XXA Fall on same level from slipping, tripping and stumbling without subsequent striking against object, initial encounter: Secondary | ICD-10-CM | POA: Diagnosis not present

## 2017-12-22 DIAGNOSIS — Z79899 Other long term (current) drug therapy: Secondary | ICD-10-CM | POA: Insufficient documentation

## 2017-12-22 DIAGNOSIS — S0990XA Unspecified injury of head, initial encounter: Secondary | ICD-10-CM | POA: Diagnosis present

## 2017-12-22 MED ORDER — ACETAMINOPHEN 325 MG PO TABS
650.0000 mg | ORAL_TABLET | Freq: Once | ORAL | Status: AC
  Administered 2017-12-22: 650 mg via ORAL
  Filled 2017-12-22: qty 2

## 2017-12-22 MED ORDER — TRAMADOL HCL 50 MG PO TABS
50.0000 mg | ORAL_TABLET | Freq: Once | ORAL | Status: AC
  Administered 2017-12-22: 50 mg via ORAL
  Filled 2017-12-22: qty 1

## 2017-12-22 MED ORDER — TRAMADOL HCL 50 MG PO TABS: 50.0000 mg | ORAL_TABLET | Freq: Four times a day (QID) | ORAL | 0 refills | Status: DC | PRN

## 2017-12-22 NOTE — ED Notes (Signed)
Pt states she tripped and fell and hit her head. Denies LOC. Pt states pain in back.

## 2017-12-22 NOTE — ED Provider Notes (Signed)
[facility] Emergency Department Provider Note   ____________________________________________    I have reviewed the triage vital signs and the nursing notes.   HISTORY  Chief Complaint Fall     HPI Brittney Ramirez is a 77 y.o. female who presents after a fall.  Patient reports that she tripped over a curb fell over onto her left elbow and struck her chest on the concrete as well as being the left side of her head.  She is on Xarelto.  She denies neck pain.  She reports the pain is primarily in her left lateral mid back and travels around underneath her left breast.  She has not taken anything for this.  She denies shortness of breath.  No neuro deficits.  No dizziness Past Medical History:  Diagnosis Date  . Arm fracture   . Atrial fibrillation ([place])   . Chronic kidney disease   . Elevated glucose   . Hyperlipidemia   . Hypertension   . Jaw fracture ([place])   . Miscarriage   . Osteoporosis   . Sleep apnea   . Thyroid cyst     Patient Active Problem List   Diagnosis Date Noted  . Advanced care planning/counseling discussion 06/11/2017  . Thoracic aortic aneurysm without rupture ([place]) 12/09/2016  . Aortic calcification ([place]) 12/09/2016  . SOBOE (shortness of breath on exertion) 11/03/2016  . Gastroesophageal reflux disease without esophagitis 05/14/2016  . CKD (chronic kidney disease) stage 3, GFR 30-59 ml/min (HCC) 05/14/2016  . Atrial fibrillation ([place]) 12/05/2015  . Hypothyroidism 12/05/2015  . Hyperlipidemia 12/05/2015  . Hypertensive heart and renal disease with congestive heart failure and renal failure ([place]) 06/07/2015  . CHF (congestive heart failure), NYHA class II, chronic, systolic ([place]) 44/81/8563  . Pulmonary nodules 05/07/2015  . Chronic systolic CHF (congestive heart failure), NYHA class 2 ([place]) 10/11/2014  . Benign essential HTN 04/06/2014  . Chronic a-fib ([place]) 04/06/2014    Past Surgical History:  Procedure Laterality  Date  . CATARACT EXTRACTION Right   . CHOLECYSTECTOMY  2014  . JOINT REPLACEMENT Right 2003    Prior to Admission medications   Medication Sig Start Date End Date Taking? Authorizing Provider  atorvastatin (LIPITOR) 40 MG tablet Take 1 tablet (40 mg total) by mouth at bedtime. 06/11/17   Guadalupe Maple, MD  latanoprost (XALATAN) 0.005 % ophthalmic solution  08/20/14   [provider]  levothyroxine (SYNTHROID, LEVOTHROID) 88 MCG tablet Take 1 tablet (88 mcg total) by mouth daily before breakfast. 06/11/17   Guadalupe Maple, MD  metoprolol tartrate (LOPRESSOR) 25 MG tablet Take 1 tablet (25 mg total) by mouth 3 (three) times daily. 06/11/17   Guadalupe Maple, MD  omeprazole (PRILOSEC) 20 MG capsule Take 1 capsule (20 mg total) by mouth daily. 06/11/17   Guadalupe Maple, MD  prednisoLONE acetate (PRED FORTE) 1 % ophthalmic suspension Apply to eye. 02/09/17   [provider]  traMADol (ULTRAM) 50 MG tablet Take 1 tablet (50 mg total) by mouth every 6 (six) hours as needed. 12/22/17 12/22/18  Lavonia Drafts, MD  valsartan-hydrochlorothiazide (DIOVAN-HCT) 160-25 MG tablet Take 1 tablet by mouth daily. 06/11/17   Guadalupe Maple, MD  XARELTO 15 MG TABS tablet 20 mg daily. 11/16/15   [provider]     Allergies Morphine and related; Alendronate; Buprenorphine hcl; Codeine; and Desipramine  Family History  Problem Relation Age of Onset  . Heart disease Mother   . Hypertension Mother   .  Heart disease Father   . Hypertension Father   . Arthritis Sister   . Cancer Sister   . Diabetes Sister   . Heart disease Sister   . Breast cancer Sister 28  . Diabetes Brother   . Heart disease Brother   . Hypertension Brother     Social History Social History   Tobacco Use  . Smoking status: Former Smoker    Start date: 02/08/1955    Last attempt to quit: 02/08/1955    Years since quitting: 62.9  . Smokeless tobacco: Never Used  Substance Use Topics  . Alcohol use: No     Alcohol/week: 0.0 oz  . Drug use: No    Review of Systems  Constitutional: No dizziness Eyes: No visual changes.  ENT: No neck pain Cardiovascular: Chest pain as above Respiratory: Denies shortness of breath. Gastrointestinal: No abdominal pain.  No nausea, no vomiting.   Genitourinary: Negative for dysuria. Musculoskeletal: Negative for back pain. Skin: Abrasion left elbow Neurological: Mild headache   ____________________________________________   PHYSICAL EXAM:  VITAL SIGNS: ED Triage Vitals  Enc Vitals Group     BP 12/22/17 1900 (!) 149/91     Pulse Rate 12/22/17 1900 96     Resp --      Temp 12/22/17 1900 98.1 F (36.7 C)     Temp Source 12/22/17 1900 Oral     SpO2 12/22/17 1900 98 %     Weight 12/22/17 1902 86.2 kg (190 lb)     Height 12/22/17 1902 1.626 m (5\' 4" )     Head Circumference --      Peak Flow --      Pain Score 12/22/17 1901 10     Pain Loc --      Pain Edu? --      Excl. in [place]? --     Constitutional: Alert and oriented. No acute distress. Pleasant and interactive Eyes: Conjunctivae are normal.  Head: Atraumatic. Nose: No swelling or epistaxis Mouth/Throat: Mucous membranes are moist.   Neck:  Painless ROM, no vertebral tenderness palpation Cardiovascular: Normal rate, regular rhythm. Grossly normal heart sounds.  Good peripheral circulation.  Left mid back: Point tenderness palpation laterally just beneath the scapula some tenderness extending around the lateral chest wall.  Suspicious for rib fracture Respiratory: Normal respiratory effort.  No retractions. Lungs CTAB. Gastrointestinal: Soft and nontender. No distention.  Musculoskeletal: Normal range of motion of all extremities.  Warm and well perfused Neurologic:  Normal speech and language. No gross focal neurologic deficits are appreciated.  Skin:  Skin is warm, dry.  Minor abrasion left elbow Psychiatric: Mood and affect are normal. Speech and behavior are  normal.  ____________________________________________   LABS (all labs ordered are listed, but only abnormal results are displayed)  Labs Reviewed - No data to display ____________________________________________  EKG  None ____________________________________________  RADIOLOGY  CT head unremarkable Rib x-rays negative T chest demonstrates nondisplaced posterior left fourth through eighth rib fractures ____________________________________________   PROCEDURES  Procedure(s) performed: No  Procedures   Critical Care performed: No ____________________________________________   INITIAL IMPRESSION / ASSESSMENT AND PLAN / ED COURSE  Pertinent labs & imaging results that were available during my care of the patient were reviewed by me and considered in my medical decision making (see chart for details).  Patient presents after a fall with point tenderness to the left lateral chest, suspicious for rib fracture, rib x-rays negative for fracture will send for CT chest.  CT head is  reassuring, no evidence of significant head trauma on exam  CT chest demonstrated multiple rib fractures, offered admission but the patient refused and stated she would like to go home with pain medications.  I feel this is reasonable.  Discussed need for incentive spirometry, risk of pneumonia, pain control.  Strict return precautions    ____________________________________________   FINAL CLINICAL IMPRESSION(S) / ED DIAGNOSES  Final diagnoses:  Closed fracture of multiple ribs of left side, initial encounter        Note:  This document was prepared using Dragon voice recognition software and may include unintentional dictation errors.    Lavonia Drafts, MD 12/22/17 2242

## 2017-12-22 NOTE — ED Triage Notes (Signed)
Pt reports that she fell over a curb. Her foot hit the corner of the parking curb and she fell hitting her left side. Pt reports that her ribs on the left posteriorly is hurting her. She has a small abrasion on her left elbow. She reports that she is on blood thinners. Husband reports that she hit her head also.

## 2018-01-07 ENCOUNTER — Telehealth: Payer: Self-pay | Admitting: Family Medicine

## 2018-01-07 NOTE — Telephone Encounter (Signed)
Copied from [place] [phone]. Topic: Quick Communication - Appointment Cancellation >> Jan 07, 2018  1:30 PM Neva Seat wrote: Pt had 4 broken ribs from a fall.  Pt went to hospital May 7th. Pt is needing a follow up w/ [facility].   Please call pt to give her a time she can be worked in to see Dr. Jeananne Rama.

## 2018-01-12 ENCOUNTER — Inpatient Hospital Stay: Payer: Medicare PPO | Admitting: Family Medicine

## 2018-01-26 ENCOUNTER — Other Ambulatory Visit: Payer: Self-pay | Admitting: Nephrology

## 2018-01-26 DIAGNOSIS — N183 Chronic kidney disease, stage 3 unspecified: Secondary | ICD-10-CM

## 2018-01-27 ENCOUNTER — Encounter: Payer: Self-pay | Admitting: Family Medicine

## 2018-01-27 ENCOUNTER — Ambulatory Visit (INDEPENDENT_AMBULATORY_CARE_PROVIDER_SITE_OTHER): Payer: Medicare PPO | Admitting: Family Medicine

## 2018-01-27 DIAGNOSIS — S2242XA Multiple fractures of ribs, left side, initial encounter for closed fracture: Secondary | ICD-10-CM | POA: Diagnosis not present

## 2018-01-27 DIAGNOSIS — S2249XA Multiple fractures of ribs, unspecified side, initial encounter for closed fracture: Secondary | ICD-10-CM | POA: Insufficient documentation

## 2018-01-27 NOTE — Progress Notes (Signed)
BP 136/85   Pulse (!) 103   Temp 98.2 F (36.8 C) (Oral)   Ht 5\' 4"  (1.626 m)   Wt 191 lb 6.4 oz (86.8 kg)   SpO2 97%   BMI 32.85 kg/m    Subjective:    Patient ID: Brittney Ramirez, female    DOB: 1940-11-27, 77 y.o.   MRN: [id number]  HPI: Brittney Ramirez is a 77 y.o. female  Chief Complaint  Patient presents with  . ER Follow Up  Patient in the emergency room for broken ribs reviewed x-ray report showing left fourth through 8 side of ribs broken.  Patient with no fever chills cough cold or respiratory type symptoms. Patient otherwise feeling good except unable to sleep in her bed, as that does ring on pain. Patient accompanied by her husband who assists with history. Patient also hit her head for mild concussion and shoulder for mild bruising.  Patient no bleeding bruising or otherwise type issues.  No neurological complaints no CNS type symptoms no headaches. Shoulder is back to full range of motion and no further bruising issues.  Relevant past medical, surgical, family and social history reviewed and updated as indicated. Interim medical history since our last visit reviewed. Allergies and medications reviewed and updated.  Review of Systems  Constitutional: Negative.   Respiratory: Negative.   Cardiovascular: Negative.     Per HPI unless specifically indicated above     Objective:    BP 136/85   Pulse (!) 103   Temp 98.2 F (36.8 C) (Oral)   Ht 5\' 4"  (1.626 m)   Wt 191 lb 6.4 oz (86.8 kg)   SpO2 97%   BMI 32.85 kg/m   Wt Readings from Last 3 Encounters:  01/27/18 191 lb 6.4 oz (86.8 kg)  12/22/17 190 lb (86.2 kg)  12/10/17 190 lb (86.2 kg)    Physical Exam  Constitutional: She is oriented to person, place, and time. She appears well-developed and well-nourished.  HENT:  Head: Normocephalic and atraumatic.  Eyes: Conjunctivae and EOM are normal.  Neck: Normal range of motion.  Cardiovascular: Normal rate, regular rhythm and normal heart sounds.    Pulmonary/Chest: Effort normal and breath sounds normal.  Musculoskeletal: Normal range of motion.  Neurological: She is alert and oriented to person, place, and time.  Skin: No erythema.  Psychiatric: She has a normal mood and affect. Her behavior is normal. Judgment and thought content normal.    Results for orders placed or performed in visit on 46/96/29  Basic metabolic panel  Result Value Ref Range   Glucose 96 65 - 99 mg/dL   BUN 24 8 - 27 mg/dL   Creatinine, Ser 1.33 (H) 0.57 - 1.00 mg/dL   GFR calc non Af Amer 38 (L) >59 mL/min/1.73   GFR calc Af Amer 44 (L) >59 mL/min/1.73   BUN/Creatinine Ratio 18 12 - 28   Sodium 141 134 - 144 mmol/L   Potassium 4.3 3.5 - 5.2 mmol/L   Chloride 103 96 - 106 mmol/L   CO2 23 20 - 29 mmol/L   Calcium 9.6 8.7 - 10.3 mg/dL      Assessment & Plan:   Problem List Items Addressed This Visit      Musculoskeletal and Integument   Multiple rib fractures    Discussed healing expectations risk of pneumonia.  Patient will be observant and follow-up if needed.       Concussion Resolving without sequela. Bruising of arm resolving without sequela.  Follow up plan: Return if symptoms worsen or fail to improve, for As scheduled.

## 2018-01-27 NOTE — Assessment & Plan Note (Signed)
Discussed healing expectations risk of pneumonia.  Patient will be observant and follow-up if needed.

## 2018-02-03 ENCOUNTER — Ambulatory Visit
Admission: RE | Admit: 2018-02-03 | Discharge: 2018-02-03 | Disposition: A | Discharge status: Home / Self Care | Payer: Medicare PPO | Source: Ambulatory Visit | Attending: Nephrology | Admitting: Nephrology

## 2018-02-03 DIAGNOSIS — N183 Chronic kidney disease, stage 3 unspecified: Secondary | ICD-10-CM

## 2018-02-03 LAB — BASIC METABOLIC PANEL
BUN: 17 (ref 4–21)
Glucose: 88
POTASSIUM: 4.1 (ref 3.4–5.3)
SODIUM: 139 (ref 137–147)

## 2018-06-10 ENCOUNTER — Ambulatory Visit: Payer: Medicare PPO

## 2018-06-17 ENCOUNTER — Ambulatory Visit (INDEPENDENT_AMBULATORY_CARE_PROVIDER_SITE_OTHER): Payer: Medicare PPO

## 2018-06-17 ENCOUNTER — Ambulatory Visit (INDEPENDENT_AMBULATORY_CARE_PROVIDER_SITE_OTHER): Payer: Medicare PPO | Admitting: Family Medicine

## 2018-06-17 ENCOUNTER — Encounter: Payer: Self-pay | Admitting: Family Medicine

## 2018-06-17 VITALS — BP 132/82 | HR 84 | Temp 98.5°F | Ht 65.0 in | Wt 191.0 lb

## 2018-06-17 VITALS — BP 132/82 | HR 84 | Temp 98.5°F | Resp 18 | Ht 65.0 in | Wt 191.5 lb

## 2018-06-17 DIAGNOSIS — Z0000 Encounter for general adult medical examination without abnormal findings: Secondary | ICD-10-CM | POA: Diagnosis not present

## 2018-06-17 DIAGNOSIS — Z125 Encounter for screening for malignant neoplasm of prostate: Secondary | ICD-10-CM

## 2018-06-17 DIAGNOSIS — E782 Mixed hyperlipidemia: Secondary | ICD-10-CM | POA: Diagnosis not present

## 2018-06-17 DIAGNOSIS — E039 Hypothyroidism, unspecified: Secondary | ICD-10-CM

## 2018-06-17 DIAGNOSIS — N19 Unspecified kidney failure: Secondary | ICD-10-CM | POA: Diagnosis not present

## 2018-06-17 DIAGNOSIS — I10 Essential (primary) hypertension: Secondary | ICD-10-CM

## 2018-06-17 DIAGNOSIS — I132 Hypertensive heart and chronic kidney disease with heart failure and with stage 5 chronic kidney disease, or end stage renal disease: Secondary | ICD-10-CM | POA: Diagnosis not present

## 2018-06-17 DIAGNOSIS — N183 Chronic kidney disease, stage 3 unspecified: Secondary | ICD-10-CM

## 2018-06-17 DIAGNOSIS — Z7189 Other specified counseling: Secondary | ICD-10-CM

## 2018-06-17 DIAGNOSIS — H6121 Impacted cerumen, right ear: Secondary | ICD-10-CM

## 2018-06-17 DIAGNOSIS — N390 Urinary tract infection, site not specified: Secondary | ICD-10-CM | POA: Insufficient documentation

## 2018-06-17 MED ORDER — NEOMYCIN-POLYMYXIN-HC 3.5-10000-1 OT SOLN: 3.0000 [drp] | Freq: Four times a day (QID) | OTIC | 0 refills | Status: DC

## 2018-06-17 MED ORDER — LEVOTHYROXINE SODIUM 88 MCG PO TABS: 88.0000 ug | ORAL_TABLET | Freq: Every day | ORAL | 4 refills | Status: DC

## 2018-06-17 MED ORDER — OMEPRAZOLE 20 MG PO CPDR: 20.0000 mg | DELAYED_RELEASE_CAPSULE | Freq: Every day | ORAL | 1 refills | Status: DC

## 2018-06-17 MED ORDER — ATORVASTATIN CALCIUM 40 MG PO TABS: 40.0000 mg | ORAL_TABLET | Freq: Every day | ORAL | 4 refills | Status: DC

## 2018-06-17 MED ORDER — VALSARTAN-HYDROCHLOROTHIAZIDE 160-25 MG PO TABS: 1.0000 | ORAL_TABLET | Freq: Every day | ORAL | 4 refills | Status: DC

## 2018-06-17 NOTE — Patient Instructions (Signed)
Brittney Ramirez , Thank you for taking time to come for your Medicare Wellness Visit. I appreciate your ongoing commitment to your health goals. Please review the following plan we discussed and let me know if I can assist you in the future.   Screening recommendations/referrals: Colonoscopy: no longer required Mammogram: Up to date Bone Density: Up to date Recommended yearly ophthalmology/optometry visit for glaucoma screening and checkup Recommended yearly dental visit for hygiene and checkup  Vaccinations: Influenza vaccine: Up to date Pneumococcal vaccine: Up to date Tdap vaccine: Up to date  Shingles vaccine: Shingrix dicussed    Advanced directives: Please bring a copy of your health care power of attorney and living will to the office at your convenience.  Conditions/risks identified: recommend exercise 3 times per week for 30 mins  Next appointment: Please follow up in one year for your Medicare Annual Wellness visit.    Preventive Care 19 Years and Older, Female Preventive care refers to lifestyle choices and visits with your health care provider that can promote health and wellness. What does preventive care include?  A yearly physical exam. This is also called an annual well check.  Dental exams once or twice a year.  Routine eye exams. Ask your health care provider how often you should have your eyes checked.  Personal lifestyle choices, including:  Daily care of your teeth and gums.  Regular physical activity.  Eating a healthy diet.  Avoiding tobacco and drug use.  Limiting alcohol use.  Practicing safe sex.  Taking low-dose aspirin every day.  Taking vitamin and mineral supplements as recommended by your health care provider. What happens during an annual well check? The services and screenings done by your health care provider during your annual well check will depend on your age, overall health, lifestyle risk factors, and family history of  disease. Counseling  Your health care provider may ask you questions about your:  Alcohol use.  Tobacco use.  Drug use.  Emotional well-being.  Home and relationship well-being.  Sexual activity.  Eating habits.  History of falls.  Memory and ability to understand (cognition).  Work and work Statistician.  Reproductive health. Screening  You may have the following tests or measurements:  Height, weight, and BMI.  Blood pressure.  Lipid and cholesterol levels. These may be checked every 5 years, or more frequently if you are over 79 years old.  Skin check.  Lung cancer screening. You may have this screening every year starting at age 51 if you have a 30-pack-year history of smoking and currently smoke or have quit within the past 15 years.  Fecal occult blood test (FOBT) of the stool. You may have this test every year starting at age 58.  Flexible sigmoidoscopy or colonoscopy. You may have a sigmoidoscopy every 5 years or a colonoscopy every 10 years starting at age 65.  Hepatitis C blood test.  Hepatitis B blood test.  Sexually transmitted disease (STD) testing.  Diabetes screening. This is done by checking your blood sugar (glucose) after you have not eaten for a while (fasting). You may have this done every 1-3 years.  Bone density scan. This is done to screen for osteoporosis. You may have this done starting at age 39.  Mammogram. This may be done every 1-2 years. Talk to your health care provider about how often you should have regular mammograms. Talk with your health care provider about your test results, treatment options, and if necessary, the need for more tests. Vaccines  Your health care provider may recommend certain vaccines, such as:  Influenza vaccine. This is recommended every year.  Tetanus, diphtheria, and acellular pertussis (Tdap, Td) vaccine. You may need a Td booster every 10 years.  Zoster vaccine. You may need this after age  74.  Pneumococcal 13-valent conjugate (PCV13) vaccine. One dose is recommended after age 19.  Pneumococcal polysaccharide (PPSV23) vaccine. One dose is recommended after age 2. Talk to your health care provider about which screenings and vaccines you need and how often you need them. This information is not intended to replace advice given to you by your health care provider. Make sure you discuss any questions you have with your health care provider. Document Released: 08/31/2015 Document Revised: 04/23/2016 Document Reviewed: 06/05/2015 Elsevier Interactive Patient Education  2017 [place] Prevention in the Home Falls can cause injuries. They can happen to people of all ages. There are many things you can do to make your home safe and to help prevent falls. What can I do on the outside of my home?  Regularly fix the edges of walkways and driveways and fix any cracks.  Remove anything that might make you trip as you walk through a door, such as a raised step or threshold.  Trim any bushes or trees on the path to your home.  Use bright outdoor lighting.  Clear any walking paths of anything that might make someone trip, such as rocks or tools.  Regularly check to see if handrails are loose or broken. Make sure that both sides of any steps have handrails.  Any raised decks and porches should have guardrails on the edges.  Have any leaves, snow, or ice cleared regularly.  Use sand or salt on walking paths during winter.  Clean up any spills in your garage right away. This includes oil or grease spills. What can I do in the bathroom?  Use night lights.  Install grab bars by the toilet and in the tub and shower. Do not use towel bars as grab bars.  Use non-skid mats or decals in the tub or shower.  If you need to sit down in the shower, use a plastic, non-slip stool.  Keep the floor dry. Clean up any water that spills on the floor as soon as it happens.  Remove  soap buildup in the tub or shower regularly.  Attach bath mats securely with double-sided non-slip rug tape.  Do not have throw rugs and other things on the floor that can make you trip. What can I do in the bedroom?  Use night lights.  Make sure that you have a light by your bed that is easy to reach.  Do not use any sheets or blankets that are too big for your bed. They should not hang down onto the floor.  Have a firm chair that has side arms. You can use this for support while you get dressed.  Do not have throw rugs and other things on the floor that can make you trip. What can I do in the kitchen?  Clean up any spills right away.  Avoid walking on wet floors.  Keep items that you use a lot in easy-to-reach places.  If you need to reach something above you, use a strong step stool that has a grab bar.  Keep electrical cords out of the way.  Do not use floor polish or wax that makes floors slippery. If you must use wax, use non-skid floor wax.  Do  not have throw rugs and other things on the floor that can make you trip. What can I do with my stairs?  Do not leave any items on the stairs.  Make sure that there are handrails on both sides of the stairs and use them. Fix handrails that are broken or loose. Make sure that handrails are as long as the stairways.  Check any carpeting to make sure that it is firmly attached to the stairs. Fix any carpet that is loose or worn.  Avoid having throw rugs at the top or bottom of the stairs. If you do have throw rugs, attach them to the floor with carpet tape.  Make sure that you have a light switch at the top of the stairs and the bottom of the stairs. If you do not have them, ask someone to add them for you. What else can I do to help prevent falls?  Wear shoes that:  Do not have high heels.  Have rubber bottoms.  Are comfortable and fit you well.  Are closed at the toe. Do not wear sandals.  If you use a  stepladder:  Make sure that it is fully opened. Do not climb a closed stepladder.  Make sure that both sides of the stepladder are locked into place.  Ask someone to hold it for you, if possible.  Clearly mark and make sure that you can see:  Any grab bars or handrails.  First and last steps.  Where the edge of each step is.  Use tools that help you move around (mobility aids) if they are needed. These include:  Canes.  Walkers.  Scooters.  Crutches.  Turn on the lights when you go into a dark area. Replace any light bulbs as soon as they burn out.  Set up your furniture so you have a clear path. Avoid moving your furniture around.  If any of your floors are uneven, fix them.  If there are any pets around you, be aware of where they are.  Review your medicines with your doctor. Some medicines can make you feel dizzy. This can increase your chance of falling. Ask your doctor what other things that you can do to help prevent falls. This information is not intended to replace advice given to you by your health care provider. Make sure you discuss any questions you have with your health care provider. Document Released: 05/31/2009 Document Revised: 01/10/2016 Document Reviewed: 09/08/2014 Elsevier Interactive Patient Education  2017 [employer].

## 2018-06-17 NOTE — Progress Notes (Signed)
BP 132/82   Pulse 84   Temp 98.5 F (36.9 C) (Oral)   Ht 5\' 5"  (1.651 m)   Wt 191 lb (86.6 kg)   SpO2 95%   BMI 31.78 kg/m    Subjective:    Patient ID: Brittney Ramirez, female    DOB: 05-Oct-1940, 77 y.o.   MRN: [id number]  HPI: Brittney Ramirez is a 77 y.o. female  Chief Complaint  Patient presents with  . Annual Exam  Patient all in all doing well has tried to minimize use of Prilosec which is gone well only has reflux symptoms occasionally and may be takes Prilosec once a week or so. Otherwise doing well with medications and concerns. Takes blood pressure medicines without problems and good control. Cholesterol medicine also doing well. Takes thyroid faithfully without problems. Rib fractures from this spring seem to have healed still has a area of seemingly roughness in the left mid back area otherwise clear. Patient also with complaints of right ear feeling stopped up with wax.  Decreased hearing.  Relevant past medical, surgical, family and social history reviewed and updated as indicated. Interim medical history since our last visit reviewed. Allergies and medications reviewed and updated.  Review of Systems  Constitutional: Negative.   HENT: Negative.   Eyes: Negative.   Respiratory: Negative.   Cardiovascular: Negative.   Gastrointestinal: Negative.   Endocrine: Negative.   Genitourinary: Negative.   Musculoskeletal: Negative.   Skin: Negative.   Allergic/Immunologic: Negative.   Neurological: Negative.   Hematological: Negative.   Psychiatric/Behavioral: Negative.     Per HPI unless specifically indicated above     Objective:    BP 132/82   Pulse 84   Temp 98.5 F (36.9 C) (Oral)   Ht 5\' 5"  (1.651 m)   Wt 191 lb (86.6 kg)   SpO2 95%   BMI 31.78 kg/m   Wt Readings from Last 3 Encounters:  06/17/18 191 lb (86.6 kg)  06/17/18 191 lb 8 oz (86.9 kg)  01/27/18 191 lb 6.4 oz (86.8 kg)    Physical Exam  Constitutional: She is oriented to person,  place, and time. She appears well-developed and well-nourished.  HENT:  Head: Normocephalic and atraumatic.  Left Ear: External ear normal.  Nose: Nose normal.  Mouth/Throat: Oropharynx is clear and moist.  Left ear normal right ear blocked with cerumen with extensive attempts to remove with water and instruments.  Patient tolerated the procedure well large amount of wax was removed but still residual wax adjacent to her TM was present and unable to remove with water.  Because of retained cerumen will refer to ear nose and throat for removal.  Eyes: Pupils are equal, round, and reactive to light. Conjunctivae and EOM are normal.  Neck: Normal range of motion. Neck supple. Carotid bruit is not present.  Cardiovascular: Normal rate, regular rhythm and normal heart sounds.  No murmur heard. Pulmonary/Chest: Effort normal and breath sounds normal. She exhibits no mass. Right breast exhibits no mass, no skin change and no tenderness. Left breast exhibits no mass, no skin change and no tenderness. Breasts are symmetrical.  Abdominal: Soft. Bowel sounds are normal. There is no hepatosplenomegaly.  Musculoskeletal: Normal range of motion.  Neurological: She is alert and oriented to person, place, and time.  Skin: No rash noted.  Psychiatric: She has a normal mood and affect. Her behavior is normal. Judgment and thought content normal.    Results for orders placed or performed in visit on  89/16/94  Basic metabolic panel  Result Value Ref Range   Glucose 88   Basic metabolic panel  Result Value Ref Range   BUN 17 4 - 21  Basic metabolic panel  Result Value Ref Range   Potassium 4.1 3.4 - 5.3   Sodium 139 137 - 147      Assessment & Plan:   Problem List Items Addressed This Visit      Cardiovascular and Mediastinum   Hypertensive heart and renal disease with congestive heart failure and renal failure ([place]) - Primary    The current medical regimen is effective;  continue present plan and  medications.       Relevant Medications   valsartan-hydrochlorothiazide (DIOVAN-HCT) 160-25 MG tablet   atorvastatin (LIPITOR) 40 MG tablet   Other Relevant Orders   Comprehensive metabolic panel   Lipid panel   CBC with Differential/Platelet   Benign essential HTN   Relevant Medications   valsartan-hydrochlorothiazide (DIOVAN-HCT) 160-25 MG tablet   atorvastatin (LIPITOR) 40 MG tablet     Endocrine   Hypothyroidism    The current medical regimen is effective;  continue present plan and medications.       Relevant Medications   levothyroxine (SYNTHROID, LEVOTHROID) 88 MCG tablet     Nervous and Auditory   Cerumen debris on tympanic membrane, right    Retained cerumen impaction right ear will refer to ear nose and throat for further removal      Relevant Orders   Ambulatory referral to ENT     Genitourinary   CKD (chronic kidney disease) stage 3, GFR 30-59 ml/min (HCC)   Relevant Orders   TSH   Urinalysis, Routine w reflex microscopic     Other   Hyperlipidemia    The current medical regimen is effective;  continue present plan and medications.       Relevant Medications   valsartan-hydrochlorothiazide (DIOVAN-HCT) 160-25 MG tablet   atorvastatin (LIPITOR) 40 MG tablet   Other Relevant Orders   Lipid panel   Advanced care planning/counseling discussion    A voluntary discussion about advanced care planning including explanation and discussion of advanced directives was extentively discussed with the patient.  Explained about the healthcare proxy and living will was reviewed and packet with forms with expiration of how to fill them out was given.  Time spent: Encounter 16+ min individuals present: Patient       Other Visit Diagnoses    PE (physical exam), annual       Prostate cancer screening       Relevant Orders   PSA       Follow up plan: Return in about 6 months (around 12/16/2018).

## 2018-06-17 NOTE — Assessment & Plan Note (Signed)
The current medical regimen is effective;  continue present plan and medications.  

## 2018-06-17 NOTE — Addendum Note (Signed)
Addended by: Golden Pop A on: 06/17/2018 10:55 AM   Modules accepted: Orders

## 2018-06-17 NOTE — Assessment & Plan Note (Signed)
A voluntary discussion about advanced care planning including explanation and discussion of advanced directives was extentively discussed with the patient.  Explained about the healthcare proxy and living will was reviewed and packet with forms with expiration of how to fill them out was given.  Time spent: Encounter 16+ min individuals present: Patient 

## 2018-06-17 NOTE — Progress Notes (Signed)
Subjective:   Brittney Ramirez is a 77 y.o. female who presents for Medicare Annual (Subsequent) preventive examination.  Review of Systems:   Cardiac Risk Factors include: advanced age (>55men, >9 women);obesity (BMI >30kg/m2);dyslipidemia;hypertension     Objective:     Vitals: BP 132/82 (BP Location: Left Arm, Patient Position: Sitting, Cuff Size: Large)   Pulse 84   Temp 98.5 F (36.9 C)   Resp 18   Ht 5\' 5"  (1.651 m)   Wt 191 lb 8 oz (86.9 kg)   SpO2 95%   BMI 31.87 kg/m   Body mass index is 31.87 kg/m.  Advanced Directives 06/17/2018 05/21/2017 12/30/2016  Does Patient Have a Medical Advance Directive? Yes Yes Yes  Type of Paramedic of [place];Living will [place];Living will [place];Living will  Copy of [place] in Chart? No - copy requested No - copy requested -    Tobacco Social History   Tobacco Use  Smoking Status Former Smoker  . Start date: 02/08/1955  . Last attempt to quit: 02/08/1955  . Years since quitting: 63.3  Smokeless Tobacco Never Used     Counseling given: Not Answered   Clinical Intake:  Pre-visit preparation completed: Yes  Pain : No/denies pain     Nutritional Status: BMI > 30  Obese Diabetes: No  How often do you need to have someone help you when you read instructions, pamphlets, or other written materials from your doctor or pharmacy?: 1 - Never What is the last grade level you completed in school?: some college  Interpreter Needed?: No  Information entered by :: Clemetine Marker LPN  Past Medical History:  Diagnosis Date  . Arm fracture   . Atrial fibrillation ([place])   . Chronic kidney disease   . Elevated glucose   . Hyperlipidemia   . Hypertension   . Jaw fracture ([place])   . Miscarriage   . Osteoporosis   . Sleep apnea   . Thyroid cyst    Past Surgical History:  Procedure Laterality Date  . CATARACT EXTRACTION Right   .  CHOLECYSTECTOMY  2014  . JOINT REPLACEMENT Right 2003   Family History  Problem Relation Age of Onset  . Heart disease Mother   . Hypertension Mother   . Heart disease Father   . Hypertension Father   . Arthritis Sister   . Cancer Sister   . Diabetes Sister   . Heart disease Sister   . Breast cancer Sister 53  . Diabetes Brother   . Heart disease Brother   . Hypertension Brother    Social History   Socioeconomic History  . Marital status: Married    Spouse name: Not on file  . Number of children: Not on file  . Years of education: Not on file  . Highest education level: Some college, no degree  Occupational History  . Occupation: retired  Scientific laboratory technician  . Financial resource strain: Not hard at all  . Food insecurity:    Worry: Never true    Inability: Never true  . Transportation needs:    Medical: No    Non-medical: No  Tobacco Use  . Smoking status: Former Smoker    Start date: 02/08/1955    Last attempt to quit: 02/08/1955    Years since quitting: 63.3  . Smokeless tobacco: Never Used  Substance and Sexual Activity  . Alcohol use: No    Alcohol/week: 0.0 standard drinks  .  Drug use: No  . Sexual activity: Not on file  Lifestyle  . Physical activity:    Days per week: 0 days    Minutes per session: 0 min  . Stress: Patient refused  Relationships  . Social connections:    Talks on phone: More than three times a week    Gets together: More than three times a week    Attends religious service: More than 4 times per year    Active member of club or organization: Yes    Attends meetings of clubs or organizations: More than 4 times per year    Relationship status: Married  Other Topics Concern  . Not on file  Social History Narrative  . Not on file    Outpatient Encounter Medications as of 06/17/2018  Medication Sig  . atorvastatin (LIPITOR) 40 MG tablet Take 1 tablet (40 mg total) by mouth at bedtime.  Marland Kitchen dextromethorphan-guaiFENesin (MUCINEX DM) 30-600  MG 12hr tablet Take 1 tablet by mouth 2 (two) times daily.  Marland Kitchen latanoprost (XALATAN) 0.005 % ophthalmic solution   . levothyroxine (SYNTHROID, LEVOTHROID) 88 MCG tablet Take 1 tablet (88 mcg total) by mouth daily before breakfast.  . metoprolol tartrate (LOPRESSOR) 50 MG tablet Take 50 mg by mouth 2 (two) times daily.  Marland Kitchen omeprazole (PRILOSEC) 20 MG capsule Take 1 capsule (20 mg total) by mouth daily.  . prednisoLONE acetate (PRED FORTE) 1 % ophthalmic suspension Apply to eye.  . rivaroxaban (XARELTO) 20 MG TABS tablet TAKE 1 TABLET (20 MG TOTAL) BY MOUTH ONCE DAILY.  . traMADol (ULTRAM) 50 MG tablet Take 1 tablet (50 mg total) by mouth every 6 (six) hours as needed.  . valsartan-hydrochlorothiazide (DIOVAN-HCT) 160-25 MG tablet Take 1 tablet by mouth daily.   No facility-administered encounter medications on file as of 06/17/2018.     Activities of Daily Living In your present state of health, do you have any difficulty performing the following activities: 06/17/2018 12/10/2017  Hearing? N N  Comment declines hearing aids -  Vision? N N  Comment wears glasses when needed -  Difficulty concentrating or making decisions? N N  Walking or climbing stairs? N N  Comment uses cane and railing -  Dressing or bathing? N N  Doing errands, shopping? N N  Preparing Food and eating ? N -  Using the Toilet? N -  In the past six months, have you accidently leaked urine? N -  Do you have problems with loss of bowel control? N -  Managing your Medications? N -  Managing your Finances? N -  Housekeeping or managing your Housekeeping? N -  Some recent data might be hidden    Patient Care Team: Guadalupe Maple, MD as PCP - General (Family Medicine) Corey Skains, MD as Consulting Physician (Internal Medicine) Dorna Bloom, MD as Referring Physician (Gastroenterology)    Assessment:   This is a routine wellness examination for [place].  Exercise Activities and Dietary recommendations Current  Exercise Habits: The patient does not participate in regular exercise at present, Exercise limited by: Other - see comments(previously went to the gym prior to fall and broken ribs)  Goals    . Exercise 3x per week (30 min per time)     Pt plans to go back to [facility] with silver sneakers program     . Increase water intake     Recommend drinking at least 4-5 glasses of water daily       Fall Risk  Fall Risk  06/17/2018 12/10/2017 06/11/2017 05/21/2017 12/09/2016  Falls in the past year? Yes No No Yes No  Number falls in past yr: 1 - - 1 -  Injury with Fall? Yes - - No -  Comment - - - went to ED to be checked for injuries -  Risk for fall due to : History of fall(s);Impaired balance/gait - - - -  Follow up Falls evaluation completed;Falls prevention discussed - - Falls prevention discussed -   FALL RISK PREVENTION PERTAINING TO THE HOME:  Any stairs in or around the home WITH handrails? Yes  Home free of loose throw rugs in walkways, pet beds, electrical cords, etc? Yes  Adequate lighting in your home to reduce risk of falls? Yes   ASSISTIVE DEVICES UTILIZED TO PREVENT FALLS:  Life alert? Yes  Use of a cane, walker or w/c? Yes  Grab bars in the bathroom? Yes  Shower chair or bench in shower? No  Elevated toilet seat or a handicapped toilet? No   DME ORDERS:  DME order needed?  No   TIMED UP AND GO:  Was the test performed? Yes .  Length of time to ambulate 10 feet: 8 sec.   GAIT:  Appearance of gait: Gait stead-fast and with the use of an assistive device. Education: Fall risk prevention has been discussed.  Intervention(s) required? No    Depression Screen PHQ 2/9 Scores 06/17/2018 12/10/2017 06/11/2017 05/21/2017  PHQ - 2 Score 0 0 0 0     Cognitive Function     6CIT Screen 05/21/2017  What Year? 0 points  What month? 0 points  What time? 0 points  Count back from 20 0 points  Months in reverse 0 points  Repeat phrase 2 points  Total Score 2     Immunization History  Administered Date(s) Administered  . Hepatitis B 05/09/2015, 06/11/2015, 08/29/2015  . Influenza, High Dose Seasonal PF 05/18/2017, 05/05/2018  . Influenza-Unspecified 05/08/2015, 05/21/2016, 05/18/2017  . Pneumococcal Conjugate-13 04/13/2014  . Pneumococcal Polysaccharide-23 02/25/2004, 01/28/2007  . Td 02/04/2005  . Tdap 03/25/2011  . Zoster 01/26/2006    Qualifies for Shingles Vaccine? Yes  Zostavax completed 01/26/2006. Due for Shingrix. Education has been provided regarding the importance of this vaccine. Pt has been advised to call insurance company to determine out of pocket expense. Advised may also receive vaccine at local pharmacy or Health Dept. Verbalized acceptance and understanding.  Tdap: Up to date  Flu Vaccine: Up to date  Pneumococcal Vaccine: Up to date  Screening Tests Health Maintenance  Topic Date Due  . TETANUS/TDAP  03/24/2021  . INFLUENZA VACCINE  Completed  . DEXA SCAN  Completed  . PNA vac Low Risk Adult  Completed    Cancer Screenings:  Colorectal Screening: No longer required.   Mammogram: Completed 07/20/17.   Bone Density: Completed 07/17/16.   Lung Cancer Screening: (Low Dose CT Chest recommended if Age 48-80 years, 30 pack-year currently smoking OR have quit w/in 15years.) does not qualify.    Additional Screening:  Hepatitis C Screening: does not qualify.  Vision Screening: Recommended annual ophthalmology exams for early detection of glaucoma and other disorders of the eye. Is the patient up to date with their annual eye exam?  Yes  Who is the provider or what is the name of the office in which the pt attends annual eye exams? Dr. Suzie [facility]  Dental Screening: Recommended annual dental exams for proper oral hygiene  Community Resource Referral:  [facility]  required this visit?  No      Plan:  I have personally reviewed and addressed the Medicare Annual Wellness questionnaire and have noted the  following in the patient's chart:  A. Medical and social history B. Use of alcohol, tobacco or illicit drugs  C. Current medications and supplements D. Functional ability and status E.  Nutritional status F.  Physical activity G. Advance directives H. List of other physicians I.  Hospitalizations, surgeries, and ER visits in previous 12 months J.  [place] such as hearing and vision if needed, cognitive and depression L. Referrals and appointments   In addition, I have reviewed and discussed with patient certain preventive protocols, quality metrics, and best practice recommendations. A written personalized care plan for preventive services as well as general preventive health recommendations were provided to patient.   Signed,  Clemetine Marker, LPN Nurse Health Advisor   Nurse Notes: pt seeing Dr. Jeananne Rama today, c/o cough past 2 weeks

## 2018-06-17 NOTE — Assessment & Plan Note (Signed)
No UTI symptoms but positive lab will check culture and sensitivity

## 2018-06-17 NOTE — Addendum Note (Signed)
Addended by: Gerda Diss A on: 06/17/2018 10:21 AM   Modules accepted: Orders

## 2018-06-17 NOTE — Assessment & Plan Note (Signed)
Retained cerumen impaction right ear will refer to ear nose and throat for further removal

## 2018-06-18 ENCOUNTER — Encounter: Payer: Self-pay | Admitting: Family Medicine

## 2018-06-18 LAB — CBC WITH DIFFERENTIAL/PLATELET
BASOS ABS: 0.1 10*3/uL (ref 0.0–0.2)
BASOS: 1 %
EOS (ABSOLUTE): 1.3 10*3/uL — ABNORMAL HIGH (ref 0.0–0.4)
Eos: 17 %
HEMATOCRIT: 41.6 % (ref 34.0–46.6)
HEMOGLOBIN: 13.2 g/dL (ref 11.1–15.9)
IMMATURE GRANS (ABS): 0 10*3/uL (ref 0.0–0.1)
Immature Granulocytes: 0 %
LYMPHS ABS: 1.8 10*3/uL (ref 0.7–3.1)
LYMPHS: 22 %
MCH: 29.9 pg (ref 26.6–33.0)
MCHC: 31.7 g/dL (ref 31.5–35.7)
MCV: 94 fL (ref 79–97)
MONOCYTES: 7 %
Monocytes Absolute: 0.6 10*3/uL (ref 0.1–0.9)
NEUTROS ABS: 4.2 10*3/uL (ref 1.4–7.0)
Neutrophils: 53 %
Platelets: 178 10*3/uL (ref 150–450)
RBC: 4.42 x10E6/uL (ref 3.77–5.28)
RDW: 13.2 % (ref 12.3–15.4)
WBC: 7.9 10*3/uL (ref 3.4–10.8)

## 2018-06-18 LAB — URINALYSIS, ROUTINE W REFLEX MICROSCOPIC
Bilirubin, UA: NEGATIVE
GLUCOSE, UA: NEGATIVE
Ketones, UA: NEGATIVE
NITRITE UA: NEGATIVE
PH UA: 5.5 (ref 5.0–7.5)
Protein, UA: NEGATIVE
Specific Gravity, UA: 1.01 (ref 1.005–1.030)
UUROB: 0.2 mg/dL (ref 0.2–1.0)

## 2018-06-18 LAB — COMPREHENSIVE METABOLIC PANEL
ALK PHOS: 77 IU/L (ref 39–117)
ALT: 9 IU/L (ref 0–32)
AST: 19 IU/L (ref 0–40)
Albumin/Globulin Ratio: 1.7 (ref 1.2–2.2)
Albumin: 4.5 g/dL (ref 3.5–4.8)
BUN/Creatinine Ratio: 15 (ref 12–28)
BUN: 20 mg/dL (ref 8–27)
Bilirubin Total: 0.7 mg/dL (ref 0.0–1.2)
CALCIUM: 10 mg/dL (ref 8.7–10.3)
CO2: 22 mmol/L (ref 20–29)
CREATININE: 1.3 mg/dL — AB (ref 0.57–1.00)
Chloride: 103 mmol/L (ref 96–106)
GFR calc Af Amer: 45 mL/min/{1.73_m2} — ABNORMAL LOW (ref >59)
GFR, EST NON AFRICAN AMERICAN: 39 mL/min/{1.73_m2} — AB (ref >59)
GLOBULIN, TOTAL: 2.6 g/dL (ref 1.5–4.5)
Glucose: 101 mg/dL — ABNORMAL HIGH (ref 65–99)
POTASSIUM: 4.4 mmol/L (ref 3.5–5.2)
SODIUM: 141 mmol/L (ref 134–144)
Total Protein: 7.1 g/dL (ref 6.0–8.5)

## 2018-06-18 LAB — LIPID PANEL
CHOL/HDL RATIO: 3.1 ratio (ref 0.0–4.4)
CHOLESTEROL TOTAL: 154 mg/dL (ref 100–199)
HDL: 50 mg/dL (ref >39)
LDL CALC: 76 mg/dL (ref 0–99)
TRIGLYCERIDES: 139 mg/dL (ref 0–149)
VLDL Cholesterol Cal: 28 mg/dL (ref 5–40)

## 2018-06-18 LAB — TSH: TSH: 3.85 u[IU]/mL (ref 0.450–4.500)

## 2018-06-18 LAB — MICROSCOPIC EXAMINATION

## 2018-06-19 LAB — URINE CULTURE

## 2018-07-02 LAB — BASIC METABOLIC PANEL
BUN: 21 (ref 4–21)
Creatinine: 1.2 — AB (ref 0.5–1.1)
Glucose: 99
Potassium: 3.7 (ref 3.4–5.3)
SODIUM: 137 (ref 137–147)

## 2018-07-02 LAB — CBC AND DIFFERENTIAL
HEMATOCRIT: 40 (ref 36–46)
HEMOGLOBIN: 13.6 (ref 12.0–16.0)
NEUTROS ABS: 3213
PLATELETS: 152 (ref 150–399)
WBC: 8.5

## 2018-09-03 ENCOUNTER — Other Ambulatory Visit: Payer: Self-pay | Admitting: Family Medicine

## 2018-09-03 DIAGNOSIS — Z1231 Encounter for screening mammogram for malignant neoplasm of breast: Secondary | ICD-10-CM

## 2018-09-22 ENCOUNTER — Ambulatory Visit
Admission: RE | Admit: 2018-09-22 | Discharge: 2018-09-22 | Disposition: A | Discharge status: Home / Self Care | Payer: Medicare HMO | Source: Ambulatory Visit | Attending: Family Medicine | Admitting: Family Medicine

## 2018-09-22 DIAGNOSIS — Z1231 Encounter for screening mammogram for malignant neoplasm of breast: Secondary | ICD-10-CM | POA: Insufficient documentation

## 2018-09-24 DIAGNOSIS — H1013 Acute atopic conjunctivitis, bilateral: Secondary | ICD-10-CM | POA: Diagnosis not present

## 2018-09-24 DIAGNOSIS — H401131 Primary open-angle glaucoma, bilateral, mild stage: Secondary | ICD-10-CM | POA: Diagnosis not present

## 2018-09-24 DIAGNOSIS — Z947 Corneal transplant status: Secondary | ICD-10-CM | POA: Diagnosis not present

## 2018-09-27 DIAGNOSIS — D229 Melanocytic nevi, unspecified: Secondary | ICD-10-CM | POA: Diagnosis not present

## 2018-09-27 DIAGNOSIS — Z85828 Personal history of other malignant neoplasm of skin: Secondary | ICD-10-CM | POA: Diagnosis not present

## 2018-09-27 DIAGNOSIS — L578 Other skin changes due to chronic exposure to nonionizing radiation: Secondary | ICD-10-CM | POA: Diagnosis not present

## 2018-09-27 DIAGNOSIS — L570 Actinic keratosis: Secondary | ICD-10-CM | POA: Diagnosis not present

## 2018-09-27 DIAGNOSIS — L814 Other melanin hyperpigmentation: Secondary | ICD-10-CM | POA: Diagnosis not present

## 2018-09-27 DIAGNOSIS — I8393 Asymptomatic varicose veins of bilateral lower extremities: Secondary | ICD-10-CM | POA: Diagnosis not present

## 2018-09-27 DIAGNOSIS — L821 Other seborrheic keratosis: Secondary | ICD-10-CM | POA: Diagnosis not present

## 2018-09-27 DIAGNOSIS — Z1283 Encounter for screening for malignant neoplasm of skin: Secondary | ICD-10-CM | POA: Diagnosis not present

## 2019-01-11 ENCOUNTER — Encounter: Payer: Self-pay | Admitting: Family Medicine

## 2019-01-11 ENCOUNTER — Other Ambulatory Visit: Payer: Self-pay

## 2019-01-11 ENCOUNTER — Ambulatory Visit (INDEPENDENT_AMBULATORY_CARE_PROVIDER_SITE_OTHER): Payer: Medicare HMO | Admitting: Family Medicine

## 2019-01-11 DIAGNOSIS — I4891 Unspecified atrial fibrillation: Secondary | ICD-10-CM | POA: Diagnosis not present

## 2019-01-11 DIAGNOSIS — I10 Essential (primary) hypertension: Secondary | ICD-10-CM

## 2019-01-11 DIAGNOSIS — I132 Hypertensive heart and chronic kidney disease with heart failure and with stage 5 chronic kidney disease, or end stage renal disease: Secondary | ICD-10-CM

## 2019-01-11 DIAGNOSIS — I700 Atherosclerosis of aorta: Secondary | ICD-10-CM | POA: Diagnosis not present

## 2019-01-11 DIAGNOSIS — I5022 Chronic systolic (congestive) heart failure: Secondary | ICD-10-CM | POA: Diagnosis not present

## 2019-01-11 DIAGNOSIS — N19 Unspecified kidney failure: Secondary | ICD-10-CM

## 2019-01-11 DIAGNOSIS — E039 Hypothyroidism, unspecified: Secondary | ICD-10-CM

## 2019-01-11 DIAGNOSIS — R609 Edema, unspecified: Secondary | ICD-10-CM | POA: Diagnosis not present

## 2019-01-11 DIAGNOSIS — I129 Hypertensive chronic kidney disease with stage 1 through stage 4 chronic kidney disease, or unspecified chronic kidney disease: Secondary | ICD-10-CM | POA: Diagnosis not present

## 2019-01-11 DIAGNOSIS — R600 Localized edema: Secondary | ICD-10-CM | POA: Diagnosis not present

## 2019-01-11 DIAGNOSIS — N183 Chronic kidney disease, stage 3 (moderate): Secondary | ICD-10-CM | POA: Diagnosis not present

## 2019-01-11 DIAGNOSIS — N189 Chronic kidney disease, unspecified: Secondary | ICD-10-CM | POA: Diagnosis not present

## 2019-01-11 MED ORDER — OMEPRAZOLE 20 MG PO CPDR: 20.0000 mg | DELAYED_RELEASE_CAPSULE | Freq: Every day | ORAL | 2 refills | Status: DC

## 2019-01-11 NOTE — Assessment & Plan Note (Signed)
The current medical regimen is effective;  continue present plan and medications.  

## 2019-01-11 NOTE — Progress Notes (Signed)
There were no vitals taken for this visit.   Subjective:    Patient ID: Brittney Ramirez, female    DOB: Sep 23, 1940, 78 y.o.   MRN: [id number]  HPI: Brittney Ramirez is a 78 y.o. female  Med check  Telemedicine using audio/video telecommunications for a synchronous communication visit. Today's visit due to COVID-19 isolation precautions I connected with and verified that I am speaking with the correct person using two identifiers.   I discussed the limitations, risks, security and privacy concerns of performing an evaluation and management service by telecommunication and the availability of in person appointments. I also discussed with the patient that there may be a patient responsible charge related to this service. The patient expressed understanding and agreed to proceed. The patient's location is home. I am at home.  Discussed with patient medical problems.  Patient stable with no complaints taking medications without problems has had cardiology follow-ups without problems or issues. Stable atrial fibrillation and medications. Blood pressure seems to be doing stable without problems. Cholesterol thyroid taking without problems or issues. Reflux stable.  Relevant past medical, surgical, family and social history reviewed and updated as indicated. Interim medical history since our last visit reviewed. Allergies and medications reviewed and updated.  Review of Systems  Constitutional: Negative.   Respiratory: Negative.   Cardiovascular: Negative.     Per HPI unless specifically indicated above     Objective:    There were no vitals taken for this visit.  Wt Readings from Last 3 Encounters:  06/17/18 191 lb (86.6 kg)  06/17/18 191 lb 8 oz (86.9 kg)  01/27/18 191 lb 6.4 oz (86.8 kg)    Physical Exam  Results for orders placed or performed in visit on 07/06/18  CBC and differential  Result Value Ref Range   Hemoglobin 13.6 12.0 - 16.0   HCT 40 36 - 46   Neutrophils  Absolute 3,213    Platelets 152 150 - 399   WBC 8.5   Basic metabolic panel  Result Value Ref Range   Glucose 99    BUN 21 4 - 21   Creatinine 1.2 (A) 0.5 - 1.1   Potassium 3.7 3.4 - 5.3   Sodium 137 137 - 147      Assessment & Plan:   Problem List Items Addressed This Visit      Cardiovascular and Mediastinum   Hypertensive heart and renal disease with congestive heart failure and renal failure (HCC)    The current medical regimen is effective;  continue present plan and medications.       Atrial fibrillation ([place])    The current medical regimen is effective;  continue present plan and medications.       Benign essential HTN    The current medical regimen is effective;  continue present plan and medications.       Aortic calcification (HCC)    The current medical regimen is effective;  continue present plan and medications.       Chronic systolic CHF (congestive heart failure), NYHA class 2 (HCC)    The current medical regimen is effective;  continue present plan and medications.         Endocrine   Hypothyroidism    The current medical regimen is effective;  continue present plan and medications.         I discussed the assessment and treatment plan with the patient. The patient was provided an opportunity to ask questions and all were  answered. The patient agreed with the plan and demonstrated an understanding of the instructions.   The patient was advised to call back or seek an in-person evaluation if the symptoms worsen or if the condition fails to improve as anticipated.   I provided 21+ minutes of time during this encounter.  Follow up plan: Return in about 6 months (around 07/14/2019) for Physical Exam.

## 2019-01-14 DIAGNOSIS — I340 Nonrheumatic mitral (valve) insufficiency: Secondary | ICD-10-CM | POA: Diagnosis not present

## 2019-01-14 DIAGNOSIS — I5022 Chronic systolic (congestive) heart failure: Secondary | ICD-10-CM | POA: Diagnosis not present

## 2019-01-14 DIAGNOSIS — G4733 Obstructive sleep apnea (adult) (pediatric): Secondary | ICD-10-CM | POA: Diagnosis not present

## 2019-01-14 DIAGNOSIS — I482 Chronic atrial fibrillation, unspecified: Secondary | ICD-10-CM | POA: Diagnosis not present

## 2019-01-14 DIAGNOSIS — I10 Essential (primary) hypertension: Secondary | ICD-10-CM | POA: Diagnosis not present

## 2019-02-25 ENCOUNTER — Encounter: Payer: Self-pay | Admitting: Family Medicine

## 2019-02-25 DIAGNOSIS — R600 Localized edema: Secondary | ICD-10-CM | POA: Diagnosis not present

## 2019-02-25 DIAGNOSIS — I10 Essential (primary) hypertension: Secondary | ICD-10-CM | POA: Diagnosis not present

## 2019-02-25 DIAGNOSIS — R8290 Unspecified abnormal findings in urine: Secondary | ICD-10-CM | POA: Diagnosis not present

## 2019-02-25 DIAGNOSIS — N183 Chronic kidney disease, stage 3 (moderate): Secondary | ICD-10-CM | POA: Diagnosis not present

## 2019-03-25 DIAGNOSIS — H401131 Primary open-angle glaucoma, bilateral, mild stage: Secondary | ICD-10-CM | POA: Diagnosis not present

## 2019-05-19 DIAGNOSIS — I340 Nonrheumatic mitral (valve) insufficiency: Secondary | ICD-10-CM | POA: Diagnosis not present

## 2019-05-19 DIAGNOSIS — I482 Chronic atrial fibrillation, unspecified: Secondary | ICD-10-CM | POA: Diagnosis not present

## 2019-05-19 DIAGNOSIS — I712 Thoracic aortic aneurysm, without rupture: Secondary | ICD-10-CM | POA: Diagnosis not present

## 2019-05-19 DIAGNOSIS — E782 Mixed hyperlipidemia: Secondary | ICD-10-CM | POA: Diagnosis not present

## 2019-05-19 DIAGNOSIS — I5022 Chronic systolic (congestive) heart failure: Secondary | ICD-10-CM | POA: Diagnosis not present

## 2019-05-19 DIAGNOSIS — G4733 Obstructive sleep apnea (adult) (pediatric): Secondary | ICD-10-CM | POA: Diagnosis not present

## 2019-06-08 DIAGNOSIS — J9809 Other diseases of bronchus, not elsewhere classified: Secondary | ICD-10-CM | POA: Diagnosis not present

## 2019-06-08 DIAGNOSIS — I712 Thoracic aortic aneurysm, without rupture: Secondary | ICD-10-CM | POA: Diagnosis not present

## 2019-06-08 DIAGNOSIS — R918 Other nonspecific abnormal finding of lung field: Secondary | ICD-10-CM | POA: Diagnosis not present

## 2019-06-16 DIAGNOSIS — I10 Essential (primary) hypertension: Secondary | ICD-10-CM | POA: Diagnosis not present

## 2019-06-16 DIAGNOSIS — I482 Chronic atrial fibrillation, unspecified: Secondary | ICD-10-CM | POA: Diagnosis not present

## 2019-06-16 DIAGNOSIS — E782 Mixed hyperlipidemia: Secondary | ICD-10-CM | POA: Diagnosis not present

## 2019-06-16 DIAGNOSIS — I5022 Chronic systolic (congestive) heart failure: Secondary | ICD-10-CM | POA: Diagnosis not present

## 2019-06-16 DIAGNOSIS — I340 Nonrheumatic mitral (valve) insufficiency: Secondary | ICD-10-CM | POA: Diagnosis not present

## 2019-06-16 DIAGNOSIS — G4733 Obstructive sleep apnea (adult) (pediatric): Secondary | ICD-10-CM | POA: Diagnosis not present

## 2019-06-27 ENCOUNTER — Ambulatory Visit (INDEPENDENT_AMBULATORY_CARE_PROVIDER_SITE_OTHER): Payer: Medicare HMO

## 2019-06-27 ENCOUNTER — Other Ambulatory Visit: Payer: Self-pay

## 2019-06-27 VITALS — BP 130/64 | HR 71 | Temp 97.4°F | Resp 16 | Ht 64.0 in | Wt 184.2 lb

## 2019-06-27 DIAGNOSIS — Z0000 Encounter for general adult medical examination without abnormal findings: Secondary | ICD-10-CM | POA: Diagnosis not present

## 2019-06-27 NOTE — Progress Notes (Signed)
Subjective:   Brittney Ramirez is a 78 y.o. female who presents for Medicare Annual (Subsequent) preventive examination.  Review of Systems:   Cardiac Risk Factors include: advanced age (>31men, >49 women);dyslipidemia;hypertension;obesity (BMI >30kg/m2)     Objective:     Vitals: BP 130/64 (BP Location: Left Arm, Patient Position: Sitting, Cuff Size: Normal)   Pulse 71   Temp (!) 97.4 F (36.3 C) (Temporal)   Resp 16   Ht 5\' 4"  (1.626 m)   Wt 184 lb 3.2 oz (83.6 kg)   SpO2 98%   BMI 31.62 kg/m   Body mass index is 31.62 kg/m.  Advanced Directives 06/27/2019 06/17/2018 05/21/2017 12/30/2016  Does Patient Have a Medical Advance Directive? Yes Yes Yes Yes  Type of Advance Directive Living will;Healthcare Power of [place];Living will [place];Living will [place];Living will  Copy of [place] in Chart? Yes - validated most recent copy scanned in chart (See row information) No - copy requested No - copy requested -    Tobacco Social History   Tobacco Use  Smoking Status Former Smoker  . Start date: 02/08/1955  . Quit date: 02/08/1955  . Years since quitting: 64.4  Smokeless Tobacco Never Used     Counseling given: Not Answered   Clinical Intake:  Pre-visit preparation completed: Yes  Pain : No/denies pain     Nutritional Status: BMI > 30  Obese Nutritional Risks: None Diabetes: No  How often do you need to have someone help you when you read instructions, pamphlets, or other written materials from your doctor or pharmacy?: 1 - Never  Interpreter Needed?: No  Information entered by :: Davari Lopes,LPN  Past Medical History:  Diagnosis Date  . Arm fracture   . Atrial fibrillation ([place])   . Chronic kidney disease   . Elevated glucose   . Hyperlipidemia   . Hypertension   . Jaw fracture ([place])   . Miscarriage   . Osteoporosis   . Sleep apnea   . Thyroid cyst    Past  Surgical History:  Procedure Laterality Date  . CATARACT EXTRACTION Right   . CHOLECYSTECTOMY  2014  . JOINT REPLACEMENT Right 2003   Family History  Problem Relation Age of Onset  . Heart disease Mother   . Hypertension Mother   . Heart disease Father   . Hypertension Father   . Arthritis Sister   . Cancer Sister   . Diabetes Sister   . Heart disease Sister   . Breast cancer Sister 64  . Diabetes Brother   . Heart disease Brother   . Hypertension Brother    Social History   Socioeconomic History  . Marital status: Married    Spouse name: Not on file  . Number of children: Not on file  . Years of education: Not on file  . Highest education level: Some college, no degree  Occupational History  . Occupation: retired  Scientific laboratory technician  . Financial resource strain: Not hard at all  . Food insecurity    Worry: Never true    Inability: Never true  . Transportation needs    Medical: No    Non-medical: No  Tobacco Use  . Smoking status: Former Smoker    Start date: 02/08/1955    Quit date: 02/08/1955    Years since quitting: 64.4  . Smokeless tobacco: Never Used  Substance and Sexual Activity  . Alcohol use: No  Alcohol/week: 0.0 standard drinks  . Drug use: No  . Sexual activity: Not on file  Lifestyle  . Physical activity    Days per week: 0 days    Minutes per session: 0 min  . Stress: Patient refused  Relationships  . Social connections    Talks on phone: More than three times a week    Gets together: More than three times a week    Attends religious service: More than 4 times per year    Active member of club or organization: Yes    Attends meetings of clubs or organizations: More than 4 times per year    Relationship status: Married  Other Topics Concern  . Not on file  Social History Narrative  . Not on file    Outpatient Encounter Medications as of 06/27/2019  Medication Sig  . atorvastatin (LIPITOR) 40 MG tablet Take 1 tablet (40 mg total) by mouth  at bedtime.  Marland Kitchen latanoprost (XALATAN) 0.005 % ophthalmic solution   . levothyroxine (SYNTHROID, LEVOTHROID) 88 MCG tablet Take 1 tablet (88 mcg total) by mouth daily before breakfast.  . metoprolol tartrate (LOPRESSOR) 100 MG tablet Take 100 mg by mouth daily.  Marland Kitchen omeprazole (PRILOSEC) 20 MG capsule Take 1 capsule (20 mg total) by mouth daily.  . rivaroxaban (XARELTO) 20 MG TABS tablet TAKE 1 TABLET (20 MG TOTAL) BY MOUTH ONCE DAILY.  . valsartan-hydrochlorothiazide (DIOVAN-HCT) 160-25 MG tablet Take 1 tablet by mouth daily.  . [DISCONTINUED] metoprolol tartrate (LOPRESSOR) 50 MG tablet Take 50 mg by mouth 2 (two) times daily.  . [DISCONTINUED] prednisoLONE acetate (PRED FORTE) 1 % ophthalmic suspension Apply to eye.   No facility-administered encounter medications on file as of 06/27/2019.     Activities of Daily Living In your present state of health, do you have any difficulty performing the following activities: 06/27/2019  Hearing? N  Comment no hearing aids  Vision? N  Comment eyeglasses, goes to [facility]  Difficulty concentrating or making decisions? Y  Comment comes back after a while - mostly names.  Walking or climbing stairs? Y  Comment long distances  Dressing or bathing? N  Doing errands, shopping? N  Preparing Food and eating ? N  Using the Toilet? N  In the past six months, have you accidently leaked urine? N  Do you have problems with loss of bowel control? N  Managing your Medications? N  Managing your Finances? N  Housekeeping or managing your Housekeeping? N  Some recent data might be hidden    Patient Care Team: Guadalupe Maple, MD as PCP - General (Family Medicine) Corey Skains, MD as Consulting Physician (Internal Medicine) Dorna Bloom, MD as Referring Physician (Gastroenterology)    Assessment:   This is a routine wellness examination for [place].  Exercise Activities and Dietary recommendations Current Exercise Habits: The patient  does not participate in regular exercise at present, Exercise limited by: None identified  Goals    . Exercise 3x per week (30 min per time)     Pt plans to go back to [facility] with silver sneakers program     . Increase water intake     Recommend drinking at least 4-5 glasses of water daily       Fall Risk: Fall Risk  06/27/2019 06/17/2018 12/10/2017 06/11/2017 05/21/2017  Falls in the past year? 0 Yes No No Yes  Number falls in past yr: 0 1 - - 1  Injury with Fall? 0 Yes - -  No  Comment - - - - went to ED to be checked for injuries  Risk for fall due to : - History of fall(s);Impaired balance/gait - - -  Follow up - Falls evaluation completed;Falls prevention discussed - - Falls prevention discussed    FALL RISK PREVENTION PERTAINING TO THE HOME:  Any stairs in or around the home? Yes steps coming inside  If so, are there any without handrails? No   Home free of loose throw rugs in walkways, pet beds, electrical cords, etc? Yes  Adequate lighting in your home to reduce risk of falls? Yes   ASSISTIVE DEVICES UTILIZED TO PREVENT FALLS:  Life alert? Yes  Use of a cane, walker or w/c? Yes  cane  Grab bars in the bathroom? Yes  Shower chair or bench in shower? Yes  Elevated toilet seat or a handicapped toilet? Yes   DME ORDERS:  DME order needed?  No   TIMED UP AND GO:  Was the test performed? Yes .  Length of time to ambulate 10 feet: 10 sec.   GAIT:  Appearance of gait: Gait steady and slow with the use of an assistive device.  Education: Fall risk prevention has been discussed.  Intervention(s) required? No   DME/home health order needed?  No    Depression Screen PHQ 2/9 Scores 06/27/2019 06/17/2018 12/10/2017 06/11/2017  PHQ - 2 Score 0 0 0 0     Cognitive Function     6CIT Screen 06/27/2019 05/21/2017  What Year? 0 points 0 points  What month? 0 points 0 points  What time? 0 points 0 points  Count back from 20 0 points 0 points  Months in reverse 0 points 0  points  Repeat phrase 0 points 2 points  Total Score 0 2    Immunization History  Administered Date(s) Administered  . Fluad Quad(high Dose 65+) 05/05/2019  . Hepatitis B 05/09/2015, 06/11/2015, 08/29/2015  . Influenza, High Dose Seasonal PF 05/18/2017, 05/05/2018  . Influenza-Unspecified 05/08/2015, 05/21/2016, 05/18/2017  . Pneumococcal Conjugate-13 04/13/2014  . Pneumococcal Polysaccharide-23 02/25/2004, 01/28/2007  . Td 02/04/2005  . Tdap 03/25/2011  . Zoster 01/26/2006    Qualifies for Shingles Vaccine? Yes  Zostavax completed 2007. Due for Shingrix. Education has been provided regarding the importance of this vaccine. Pt has been advised to call insurance company to determine out of pocket expense. Advised may also receive vaccine at local pharmacy or Health Dept. Verbalized acceptance and understanding.  Tdap: up to date   Flu Vaccine: up to date   Pneumococcal Vaccine: up to date   Screening Tests Health Maintenance  Topic Date Due  . TETANUS/TDAP  03/24/2021  . INFLUENZA VACCINE  Completed  . DEXA SCAN  Completed  . PNA vac Low Risk Adult  Completed    Cancer Screenings:  Colorectal Screening: no longer required   Mammogram: Completed 09/22/2018.  Bone Density: no longer required   Lung Cancer Screening: (Low Dose CT Chest recommended if Age 79-80 years, 30 pack-year currently smoking OR have quit w/in 15years.) does not qualify.   Additional Screening:  Hepatitis C Screening: does not qualify  Vision Screening: Recommended annual ophthalmology exams for early detection of glaucoma and other disorders of the eye. Is the patient up to date with their annual eye exam?  Yes  Who is the provider or what is the name of the office in which the pt attends annual eye exams? [facility]    Dental Screening: Recommended annual dental exams  for proper oral hygiene  Community Resource Referral:  CRR required this visit?  No       Plan:  I have  personally reviewed and addressed the Medicare Annual Wellness questionnaire and have noted the following in the patient's chart:  A. Medical and social history B. Use of alcohol, tobacco or illicit drugs  C. Current medications and supplements D. Functional ability and status E.  Nutritional status F.  Physical activity G. Advance directives H. List of other physicians I.  Hospitalizations, surgeries, and ER visits in previous 12 months J.  [place] such as hearing and vision if needed, cognitive and depression L. Referrals and appointments   In addition, I have reviewed and discussed with patient certain preventive protocols, quality metrics, and best practice recommendations. A written personalized care plan for preventive services as well as general preventive health recommendations were provided to patient.  Signed,    Bevelyn Ngo, LPN  [id number] Nurse Health Advisor  nurse notes: none

## 2019-06-27 NOTE — Patient Instructions (Addendum)
Brittney Ramirez , Thank you for taking time to come for your Medicare Wellness Visit. I appreciate your ongoing commitment to your health goals. Please review the following plan we discussed and let me know if I can assist you in the future.   Screening recommendations/referrals: Colonoscopy: no longer required Mammogram:  Completed 09/2018 Bone Density: no longer required Recommended yearly ophthalmology/optometry visit for glaucoma screening and checkup Recommended yearly dental visit for hygiene and checkup  Vaccinations: Influenza vaccine: up to date  Pneumococcal vaccine: up to date Tdap vaccine: up to date Shingles vaccine: shingrix eligible     Advanced directives: copy on file   Conditions/risks identified: discussed memory supplements such as omega 3 and ginko.   Next appointment: Follow up in on year for your annual wellness visit.    Preventive Care 34 Years and Older, Female Preventive care refers to lifestyle choices and visits with your health care provider that can promote health and wellness. What does preventive care include?  A yearly physical exam. This is also called an annual well check.  Dental exams once or twice a year.  Routine eye exams. Ask your health care provider how often you should have your eyes checked.  Personal lifestyle choices, including:  Daily care of your teeth and gums.  Regular physical activity.  Eating a healthy diet.  Avoiding tobacco and drug use.  Limiting alcohol use.  Practicing safe sex.  Taking low-dose aspirin every day.  Taking vitamin and mineral supplements as recommended by your health care provider. What happens during an annual well check? The services and screenings done by your health care provider during your annual well check will depend on your age, overall health, lifestyle risk factors, and family history of disease. Counseling  Your health care provider may ask you questions about your:  Alcohol use.   Tobacco use.  Drug use.  Emotional well-being.  Home and relationship well-being.  Sexual activity.  Eating habits.  History of falls.  Memory and ability to understand (cognition).  Work and work Statistician.  Reproductive health. Screening  You may have the following tests or measurements:  Height, weight, and BMI.  Blood pressure.  Lipid and cholesterol levels. These may be checked every 5 years, or more frequently if you are over 66 years old.  Skin check.  Lung cancer screening. You may have this screening every year starting at age 6 if you have a 30-pack-year history of smoking and currently smoke or have quit within the past 15 years.  Fecal occult blood test (FOBT) of the stool. You may have this test every year starting at age 30.  Flexible sigmoidoscopy or colonoscopy. You may have a sigmoidoscopy every 5 years or a colonoscopy every 10 years starting at age 73.  Hepatitis C blood test.  Hepatitis B blood test.  Sexually transmitted disease (STD) testing.  Diabetes screening. This is done by checking your blood sugar (glucose) after you have not eaten for a while (fasting). You may have this done every 1-3 years.  Bone density scan. This is done to screen for osteoporosis. You may have this done starting at age 108.  Mammogram. This may be done every 1-2 years. Talk to your health care provider about how often you should have regular mammograms. Talk with your health care provider about your test results, treatment options, and if necessary, the need for more tests. Vaccines  Your health care provider may recommend certain vaccines, such as:  Influenza vaccine. This is recommended  every year.  Tetanus, diphtheria, and acellular pertussis (Tdap, Td) vaccine. You may need a Td booster every 10 years.  Zoster vaccine. You may need this after age 22.  Pneumococcal 13-valent conjugate (PCV13) vaccine. One dose is recommended after age 56.  Pneumococcal  polysaccharide (PPSV23) vaccine. One dose is recommended after age 61. Talk to your health care provider about which screenings and vaccines you need and how often you need them. This information is not intended to replace advice given to you by your health care provider. Make sure you discuss any questions you have with your health care provider. Document Released: 08/31/2015 Document Revised: 04/23/2016 Document Reviewed: 06/05/2015 Elsevier Interactive Patient Education  2017 [place] Prevention in the Home Falls can cause injuries. They can happen to people of all ages. There are many things you can do to make your home safe and to help prevent falls. What can I do on the outside of my home?  Regularly fix the edges of walkways and driveways and fix any cracks.  Remove anything that might make you trip as you walk through a door, such as a raised step or threshold.  Trim any bushes or trees on the path to your home.  Use bright outdoor lighting.  Clear any walking paths of anything that might make someone trip, such as rocks or tools.  Regularly check to see if handrails are loose or broken. Make sure that both sides of any steps have handrails.  Any raised decks and porches should have guardrails on the edges.  Have any leaves, snow, or ice cleared regularly.  Use sand or salt on walking paths during winter.  Clean up any spills in your garage right away. This includes oil or grease spills. What can I do in the bathroom?  Use night lights.  Install grab bars by the toilet and in the tub and shower. Do not use towel bars as grab bars.  Use non-skid mats or decals in the tub or shower.  If you need to sit down in the shower, use a plastic, non-slip stool.  Keep the floor dry. Clean up any water that spills on the floor as soon as it happens.  Remove soap buildup in the tub or shower regularly.  Attach bath mats securely with double-sided non-slip rug tape.   Do not have throw rugs and other things on the floor that can make you trip. What can I do in the bedroom?  Use night lights.  Make sure that you have a light by your bed that is easy to reach.  Do not use any sheets or blankets that are too big for your bed. They should not hang down onto the floor.  Have a firm chair that has side arms. You can use this for support while you get dressed.  Do not have throw rugs and other things on the floor that can make you trip. What can I do in the kitchen?  Clean up any spills right away.  Avoid walking on wet floors.  Keep items that you use a lot in easy-to-reach places.  If you need to reach something above you, use a strong step stool that has a grab bar.  Keep electrical cords out of the way.  Do not use floor polish or wax that makes floors slippery. If you must use wax, use non-skid floor wax.  Do not have throw rugs and other things on the floor that can make you trip. What  can I do with my stairs?  Do not leave any items on the stairs.  Make sure that there are handrails on both sides of the stairs and use them. Fix handrails that are broken or loose. Make sure that handrails are as long as the stairways.  Check any carpeting to make sure that it is firmly attached to the stairs. Fix any carpet that is loose or worn.  Avoid having throw rugs at the top or bottom of the stairs. If you do have throw rugs, attach them to the floor with carpet tape.  Make sure that you have a light switch at the top of the stairs and the bottom of the stairs. If you do not have them, ask someone to add them for you. What else can I do to help prevent falls?  Wear shoes that:  Do not have high heels.  Have rubber bottoms.  Are comfortable and fit you well.  Are closed at the toe. Do not wear sandals.  If you use a stepladder:  Make sure that it is fully opened. Do not climb a closed stepladder.  Make sure that both sides of the  stepladder are locked into place.  Ask someone to hold it for you, if possible.  Clearly mark and make sure that you can see:  Any grab bars or handrails.  First and last steps.  Where the edge of each step is.  Use tools that help you move around (mobility aids) if they are needed. These include:  Canes.  Walkers.  Scooters.  Crutches.  Turn on the lights when you go into a dark area. Replace any light bulbs as soon as they burn out.  Set up your furniture so you have a clear path. Avoid moving your furniture around.  If any of your floors are uneven, fix them.  If there are any pets around you, be aware of where they are.  Review your medicines with your doctor. Some medicines can make you feel dizzy. This can increase your chance of falling. Ask your doctor what other things that you can do to help prevent falls. This information is not intended to replace advice given to you by your health care provider. Make sure you discuss any questions you have with your health care provider. Document Released: 05/31/2009 Document Revised: 01/10/2016 Document Reviewed: 09/08/2014 Elsevier Interactive Patient Education  2017 [employer].

## 2019-07-05 DIAGNOSIS — N183 Chronic kidney disease, stage 3 unspecified: Secondary | ICD-10-CM | POA: Diagnosis not present

## 2019-07-05 DIAGNOSIS — N390 Urinary tract infection, site not specified: Secondary | ICD-10-CM | POA: Diagnosis not present

## 2019-07-05 DIAGNOSIS — I10 Essential (primary) hypertension: Secondary | ICD-10-CM | POA: Diagnosis not present

## 2019-07-05 DIAGNOSIS — R600 Localized edema: Secondary | ICD-10-CM | POA: Diagnosis not present

## 2019-07-11 DIAGNOSIS — N1831 Chronic kidney disease, stage 3a: Secondary | ICD-10-CM | POA: Diagnosis not present

## 2019-07-29 ENCOUNTER — Other Ambulatory Visit: Payer: Self-pay

## 2019-07-29 DIAGNOSIS — I132 Hypertensive heart and chronic kidney disease with heart failure and with stage 5 chronic kidney disease, or end stage renal disease: Secondary | ICD-10-CM

## 2019-07-29 DIAGNOSIS — E782 Mixed hyperlipidemia: Secondary | ICD-10-CM

## 2019-07-29 DIAGNOSIS — N19 Unspecified kidney failure: Secondary | ICD-10-CM

## 2019-07-29 DIAGNOSIS — E039 Hypothyroidism, unspecified: Secondary | ICD-10-CM

## 2019-07-29 MED ORDER — VALSARTAN-HYDROCHLOROTHIAZIDE 160-25 MG PO TABS: 1.0000 | ORAL_TABLET | Freq: Every day | ORAL | 0 refills | Status: AC

## 2019-07-29 MED ORDER — ATORVASTATIN CALCIUM 40 MG PO TABS: 40.0000 mg | ORAL_TABLET | Freq: Every day | ORAL | 0 refills | Status: DC

## 2019-07-29 MED ORDER — LEVOTHYROXINE SODIUM 88 MCG PO TABS: 88.0000 ug | ORAL_TABLET | Freq: Every day | ORAL | 0 refills | Status: AC

## 2019-07-29 NOTE — Telephone Encounter (Signed)
Patient last seen 01/11/19.

## 2019-08-15 ENCOUNTER — Other Ambulatory Visit: Payer: Self-pay | Admitting: Internal Medicine

## 2019-08-15 DIAGNOSIS — Z1231 Encounter for screening mammogram for malignant neoplasm of breast: Secondary | ICD-10-CM

## 2019-08-30 ENCOUNTER — Encounter: Payer: Self-pay | Admitting: Emergency Medicine

## 2019-08-30 ENCOUNTER — Emergency Department
Admission: EM | Admit: 2019-08-30 | Discharge: 2019-08-30 | Disposition: A | Discharge status: Home / Self Care | Payer: Medicare HMO | Attending: Emergency Medicine | Admitting: Emergency Medicine

## 2019-08-30 ENCOUNTER — Other Ambulatory Visit: Payer: Self-pay

## 2019-08-30 DIAGNOSIS — Z79899 Other long term (current) drug therapy: Secondary | ICD-10-CM | POA: Diagnosis not present

## 2019-08-30 DIAGNOSIS — R040 Epistaxis: Secondary | ICD-10-CM | POA: Diagnosis not present

## 2019-08-30 DIAGNOSIS — Z23 Encounter for immunization: Secondary | ICD-10-CM | POA: Diagnosis not present

## 2019-08-30 DIAGNOSIS — N183 Chronic kidney disease, stage 3 unspecified: Secondary | ICD-10-CM | POA: Diagnosis not present

## 2019-08-30 DIAGNOSIS — Z87891 Personal history of nicotine dependence: Secondary | ICD-10-CM | POA: Diagnosis not present

## 2019-08-30 DIAGNOSIS — I130 Hypertensive heart and chronic kidney disease with heart failure and stage 1 through stage 4 chronic kidney disease, or unspecified chronic kidney disease: Secondary | ICD-10-CM | POA: Insufficient documentation

## 2019-08-30 DIAGNOSIS — Z7901 Long term (current) use of anticoagulants: Secondary | ICD-10-CM | POA: Insufficient documentation

## 2019-08-30 DIAGNOSIS — I5022 Chronic systolic (congestive) heart failure: Secondary | ICD-10-CM | POA: Insufficient documentation

## 2019-08-30 LAB — CBC
HCT: 39.8 % (ref 36.0–46.0)
Hemoglobin: 12.9 g/dL (ref 12.0–15.0)
MCH: 31.2 pg (ref 26.0–34.0)
MCHC: 32.4 g/dL (ref 30.0–36.0)
MCV: 96.4 fL (ref 80.0–100.0)
Platelets: 150 K/uL (ref 150–400)
RBC: 4.13 MIL/uL (ref 3.87–5.11)
RDW: 14.6 % (ref 11.5–15.5)
WBC: 7.2 K/uL (ref 4.0–10.5)
nRBC: 0 % (ref 0.0–0.2)

## 2019-08-30 LAB — BASIC METABOLIC PANEL WITH GFR
Anion gap: 13 (ref 5–15)
BUN: 26 mg/dL — ABNORMAL HIGH (ref 8–23)
CO2: 23 mmol/L (ref 22–32)
Calcium: 9.7 mg/dL (ref 8.9–10.3)
Chloride: 105 mmol/L (ref 98–111)
Creatinine, Ser: 1.17 mg/dL — ABNORMAL HIGH (ref 0.44–1.00)
GFR calc Af Amer: 51 mL/min — ABNORMAL LOW (ref >60)
GFR calc non Af Amer: 44 mL/min — ABNORMAL LOW (ref >60)
Glucose, Bld: 114 mg/dL — ABNORMAL HIGH (ref 70–99)
Potassium: 3.7 mmol/L (ref 3.5–5.1)
Sodium: 141 mmol/L (ref 135–145)

## 2019-08-30 MED ORDER — OXYMETAZOLINE HCL 0.05 % NA SOLN
1.0000 | Freq: Once | NASAL | Status: AC
  Administered 2019-08-30: 12:00:00 1 via NASAL
  Filled 2019-08-30: qty 30

## 2019-08-30 MED ORDER — TRANEXAMIC ACID 1000 MG/10ML IV SOLN
500.0000 mg | Freq: Once | INTRAVENOUS | Status: AC
  Administered 2019-08-30: 500 mg via TOPICAL
  Filled 2019-08-30: qty 10

## 2019-08-30 MED ORDER — TRANEXAMIC ACID-NACL 1000-0.7 MG/100ML-% IV SOLN
1000.0000 mg | Freq: Once | INTRAVENOUS | Status: DC
  Filled 2019-08-30: qty 100

## 2019-08-30 MED ORDER — TRANEXAMIC ACID 1000 MG/10ML IV SOLN
500.0000 mg | Freq: Once | INTRAVENOUS | Status: DC
  Filled 2019-08-30: qty 10

## 2019-08-30 NOTE — Discharge Instructions (Addendum)
Keep the nose moisturized using OTC saline mist and gel. Use the Afrin as directed to prevent further nose bleeds. Rest with the head elevated, and avoid manipulating your nose. Do NOT blow your nose for the next 3 days. Consider a room humidifier or boiling water on the stove, to increase the moisture in the home. Follow-up with [facility] or [facility] as needed.

## 2019-08-30 NOTE — ED Triage Notes (Signed)
Pt here with c/o nosebleed since 1130pm. Had the covid vaccine this am around 0830. Is on blood thinners, nasal clamp applied. Pt in NAD.

## 2019-08-30 NOTE — ED Notes (Signed)
Since clamp applied, pt states blood is draining down the back of her throat.

## 2019-08-30 NOTE — ED Notes (Signed)
Called pharmacy about TXA order. Correct order confirmed. Pharmacy stated they would send it to ER.

## 2019-08-30 NOTE — ED Provider Notes (Signed)
[facility] Emergency Department Provider Note ____________________________________________  Time seen: 1542  I have reviewed the triage vital signs and the nursing notes.  HISTORY  Chief Complaint  Epistaxis  HPI Brittney Ramirez is a 79 y.o. female presents to the ED from home, for evaluation of a persistent nosebleed.   She reports nosebleeding since about 11:30 AM.  Patient got her first dose of the Covid vaccines morning at about 830.  She noted that by about 9 AM she began experiencing active spontaneous nosebleed.  She is currently taking Xarelto for A. fib, but denies any persistent abnormal bleeding, bruising, or history of nosebleeds.  Patient presents now with nose packing in place and the nasal clamp applied.  Past Medical History:  Diagnosis Date  . Arm fracture   . Atrial fibrillation ([place])   . Chronic kidney disease   . Elevated glucose   . Hyperlipidemia   . Hypertension   . Jaw fracture ([place])   . Miscarriage   . Osteoporosis   . Sleep apnea   . Thyroid cyst     Patient Active Problem List   Diagnosis Date Noted  . Cerumen debris on tympanic membrane, right 06/17/2018  . UTI (urinary tract infection) 06/17/2018  . Multiple rib fractures 01/27/2018  . Advanced care planning/counseling discussion 06/11/2017  . Thoracic aortic aneurysm without rupture ([place]) 12/09/2016  . Aortic calcification ([place]) 12/09/2016  . SOBOE (shortness of breath on exertion) 11/03/2016  . Gastroesophageal reflux disease without esophagitis 05/14/2016  . CKD (chronic kidney disease) stage 3, GFR 30-59 ml/min 05/14/2016  . Atrial fibrillation ([place]) 12/05/2015  . Hypothyroidism 12/05/2015  . Hyperlipidemia 12/05/2015  . Hypertensive heart and renal disease with congestive heart failure and renal failure ([place]) 06/07/2015  . CHF (congestive heart failure), NYHA class II, chronic, systolic ([place]) [id number]  . Pulmonary nodules 05/07/2015  . Chronic systolic CHF  (congestive heart failure), NYHA class 2 ([place]) 10/11/2014  . Benign essential HTN 04/06/2014  . Chronic a-fib ([place]) 04/06/2014    Past Surgical History:  Procedure Laterality Date  . CATARACT EXTRACTION Right   . CHOLECYSTECTOMY  2014  . JOINT REPLACEMENT Right 2003    Prior to Admission medications   Medication Sig Start Date End Date Taking? Authorizing Provider  atorvastatin (LIPITOR) 40 MG tablet Take 1 tablet (40 mg total) by mouth at bedtime. 07/29/19   Volney American, PA-C  latanoprost (XALATAN) 0.005 % ophthalmic solution  08/20/14   [provider]  levothyroxine (SYNTHROID) 88 MCG tablet Take 1 tablet (88 mcg total) by mouth daily before breakfast. 07/29/19   Volney American, PA-C  metoprolol tartrate (LOPRESSOR) 100 MG tablet Take 100 mg by mouth daily.    [provider]  omeprazole (PRILOSEC) 20 MG capsule Take 1 capsule (20 mg total) by mouth daily. 01/11/19   Guadalupe Maple, MD  rivaroxaban (XARELTO) 20 MG TABS tablet TAKE 1 TABLET (20 MG TOTAL) BY MOUTH ONCE DAILY. 01/05/18   [provider]  valsartan-hydrochlorothiazide (DIOVAN-HCT) 160-25 MG tablet Take 1 tablet by mouth daily. 07/29/19   Volney American, PA-C    Allergies Morphine and related, Alendronate, Buprenorphine hcl, Codeine, and Desipramine  Family History  Problem Relation Age of Onset  . Heart disease Mother   . Hypertension Mother   . Heart disease Father   . Hypertension Father   . Arthritis Sister   . Cancer Sister   . Diabetes Sister   . Heart disease Sister   .  Breast cancer Sister 79  . Diabetes Brother   . Heart disease Brother   . Hypertension Brother     Social History Social History   Tobacco Use  . Smoking status: Former Smoker    Start date: 02/08/1955    Quit date: 02/08/1955    Years since quitting: 64.6  . Smokeless tobacco: Never Used  Substance Use Topics  . Alcohol use: No    Alcohol/week: 0.0 standard drinks  . Drug  use: No    Review of Systems  Constitutional: Negative for fever. Eyes: Negative for visual changes. ENT: Negative for sore throat. Positive for epistaxis Cardiovascular: Negative for chest pain. Respiratory: Negative for shortness of breath. Musculoskeletal: Negative for back pain. Skin: Negative for rash. Neurological: Negative for headaches, focal weakness or numbness. ____________________________________________  PHYSICAL EXAM:  VITAL SIGNS: ED Triage Vitals  Enc Vitals Group     BP 08/30/19 1403 (!) 127/92     Pulse Rate 08/30/19 1403 89     Resp 08/30/19 1403 19     Temp 08/30/19 1542 (!) 97.4 F (36.3 C)     Temp src --      SpO2 08/30/19 1403 97 %     Weight 08/30/19 1201 180 lb (81.6 kg)     Height 08/30/19 1201 5\' 6"  (1.676 m)     Head Circumference --      Peak Flow --      Pain Score 08/30/19 1201 0     Pain Loc --      Pain Edu? --      Excl. in [place]? --     Constitutional: Alert and oriented. Well appearing and in no distress. Head: Normocephalic and atraumatic. Eyes: Conjunctivae are normal. Normal extraocular movements Nose: No congestion/rhinorrhea. Active anterior bleed noted in the right nostril at the septal vestibule. Mouth/Throat: Mucous membranes are moist. Cardiovascular: Normal rate, regular rhythm. Normal distal pulses. Respiratory: Normal respiratory effort. No wheezes/rales/rhonchi. Musculoskeletal: Nontender with normal range of motion in all extremities.  Neurologic:  Normal gait without ataxia. Normal speech and language. No gross focal neurologic deficits are appreciated. Skin:  Skin is warm, dry and intact. No rash noted. ____________________________________________  PROCEDURES  Afrin ii sprays each nostril Nose clamp TXA applied topicallly x 30 minutes Procedures ____________________________________________  INITIAL IMPRESSION / ASSESSMENT AND PLAN / ED COURSE  Geriatric patient with ED evaluation of a persistent right-sided  nosebleed. She presents with a spontaneous nosebleed. She has been treated in the ED with topical oxymetazoline and TXA. We were able to abort the nosebleed with TXA. Patient is discharged with instructions and supplies to abort a recurrent spontaneous nosebleed. She will follow-up with her provider, [facility], or return to the ED as needed.   LARONA SHEFFLER was evaluated in Emergency Department on 08/30/2019 for the symptoms described in the history of present illness. She was evaluated in the context of the global COVID-19 pandemic, which necessitated consideration that the patient might be at risk for infection with the SARS-CoV-2 virus that causes COVID-19. Institutional protocols and algorithms that pertain to the evaluation of patients at risk for COVID-19 are in a state of rapid change based on information released by regulatory bodies including the CDC and federal and state organizations. These policies and algorithms were followed during the patient's care in the ED. ____________________________________________  FINAL CLINICAL IMPRESSION(S) / ED DIAGNOSES  Final diagnoses:  Epistaxis      Carmie End, Dannielle Karvonen, PA-C 08/30/19 1737  Nance Pear, MD 08/30/19 2115

## 2019-10-04 DIAGNOSIS — H401131 Primary open-angle glaucoma, bilateral, mild stage: Secondary | ICD-10-CM | POA: Diagnosis not present

## 2019-10-04 DIAGNOSIS — Z947 Corneal transplant status: Secondary | ICD-10-CM | POA: Diagnosis not present

## 2019-10-10 ENCOUNTER — Ambulatory Visit
Admission: RE | Admit: 2019-10-10 | Discharge: 2019-10-10 | Disposition: A | Discharge status: Home / Self Care | Payer: Medicare HMO | Source: Ambulatory Visit | Attending: Internal Medicine | Admitting: Internal Medicine

## 2019-10-10 DIAGNOSIS — Z1231 Encounter for screening mammogram for malignant neoplasm of breast: Secondary | ICD-10-CM | POA: Diagnosis not present

## 2019-10-19 DIAGNOSIS — K219 Gastro-esophageal reflux disease without esophagitis: Secondary | ICD-10-CM | POA: Diagnosis not present

## 2019-10-19 DIAGNOSIS — Z0000 Encounter for general adult medical examination without abnormal findings: Secondary | ICD-10-CM | POA: Diagnosis not present

## 2019-10-19 DIAGNOSIS — Z1331 Encounter for screening for depression: Secondary | ICD-10-CM | POA: Diagnosis not present

## 2019-10-19 DIAGNOSIS — I482 Chronic atrial fibrillation, unspecified: Secondary | ICD-10-CM | POA: Diagnosis not present

## 2019-10-19 DIAGNOSIS — I700 Atherosclerosis of aorta: Secondary | ICD-10-CM | POA: Diagnosis not present

## 2019-10-19 DIAGNOSIS — I5022 Chronic systolic (congestive) heart failure: Secondary | ICD-10-CM | POA: Diagnosis not present

## 2019-10-19 DIAGNOSIS — N183 Chronic kidney disease, stage 3 unspecified: Secondary | ICD-10-CM | POA: Diagnosis not present

## 2019-10-19 DIAGNOSIS — I10 Essential (primary) hypertension: Secondary | ICD-10-CM | POA: Diagnosis not present

## 2019-10-19 DIAGNOSIS — I2510 Atherosclerotic heart disease of native coronary artery without angina pectoris: Secondary | ICD-10-CM | POA: Diagnosis not present

## 2019-10-21 ENCOUNTER — Other Ambulatory Visit: Payer: Self-pay | Admitting: Family Medicine

## 2019-10-21 DIAGNOSIS — N19 Unspecified kidney failure: Secondary | ICD-10-CM

## 2019-10-21 DIAGNOSIS — E039 Hypothyroidism, unspecified: Secondary | ICD-10-CM

## 2019-10-21 DIAGNOSIS — I132 Hypertensive heart and chronic kidney disease with heart failure and with stage 5 chronic kidney disease, or end stage renal disease: Secondary | ICD-10-CM

## 2019-10-21 DIAGNOSIS — I10 Essential (primary) hypertension: Secondary | ICD-10-CM | POA: Diagnosis not present

## 2019-10-21 DIAGNOSIS — R040 Epistaxis: Secondary | ICD-10-CM | POA: Diagnosis not present

## 2019-10-21 DIAGNOSIS — E782 Mixed hyperlipidemia: Secondary | ICD-10-CM

## 2019-10-21 NOTE — Telephone Encounter (Signed)
Requested medications are due for refill today?  Yes  Requested medications are on active medication list?  Yes  Last Refill:   07/29/2019 - # 90 with 0 refills - for all 3 meds.  Courtesy refills given in December and it was noted at that time patient needs to schedule office visit before receiving more refills.   Either patient is overdue for office visit or overdue for lab work per RX refill protocol.    Future visit scheduled? Yes in 8 months.  Hypertensive med requires 6 month visit per protocol.  Last visit was 9 months ago.    Notes to Clinic:

## 2019-10-24 NOTE — Telephone Encounter (Signed)
LOV 06/27/19

## 2019-11-01 DIAGNOSIS — R040 Epistaxis: Secondary | ICD-10-CM | POA: Diagnosis not present

## 2019-12-22 DIAGNOSIS — I10 Essential (primary) hypertension: Secondary | ICD-10-CM | POA: Diagnosis not present

## 2019-12-22 DIAGNOSIS — I482 Chronic atrial fibrillation, unspecified: Secondary | ICD-10-CM | POA: Diagnosis not present

## 2019-12-22 DIAGNOSIS — I5022 Chronic systolic (congestive) heart failure: Secondary | ICD-10-CM | POA: Diagnosis not present

## 2019-12-22 DIAGNOSIS — E782 Mixed hyperlipidemia: Secondary | ICD-10-CM | POA: Diagnosis not present

## 2019-12-22 DIAGNOSIS — I2510 Atherosclerotic heart disease of native coronary artery without angina pectoris: Secondary | ICD-10-CM | POA: Diagnosis not present

## 2019-12-23 DIAGNOSIS — I482 Chronic atrial fibrillation, unspecified: Secondary | ICD-10-CM | POA: Diagnosis not present

## 2020-01-25 DIAGNOSIS — I5022 Chronic systolic (congestive) heart failure: Secondary | ICD-10-CM | POA: Diagnosis not present

## 2020-01-25 DIAGNOSIS — I2510 Atherosclerotic heart disease of native coronary artery without angina pectoris: Secondary | ICD-10-CM | POA: Diagnosis not present

## 2020-01-25 DIAGNOSIS — I482 Chronic atrial fibrillation, unspecified: Secondary | ICD-10-CM | POA: Diagnosis not present

## 2020-01-30 DIAGNOSIS — I340 Nonrheumatic mitral (valve) insufficiency: Secondary | ICD-10-CM | POA: Diagnosis not present

## 2020-01-30 DIAGNOSIS — I482 Chronic atrial fibrillation, unspecified: Secondary | ICD-10-CM | POA: Diagnosis not present

## 2020-01-30 DIAGNOSIS — I5022 Chronic systolic (congestive) heart failure: Secondary | ICD-10-CM | POA: Diagnosis not present

## 2020-01-30 DIAGNOSIS — I10 Essential (primary) hypertension: Secondary | ICD-10-CM | POA: Diagnosis not present

## 2020-01-30 DIAGNOSIS — E782 Mixed hyperlipidemia: Secondary | ICD-10-CM | POA: Diagnosis not present

## 2020-04-05 DIAGNOSIS — I10 Essential (primary) hypertension: Secondary | ICD-10-CM | POA: Diagnosis not present

## 2020-04-05 DIAGNOSIS — R040 Epistaxis: Secondary | ICD-10-CM | POA: Diagnosis not present

## 2020-04-09 DIAGNOSIS — R040 Epistaxis: Secondary | ICD-10-CM | POA: Diagnosis not present

## 2020-04-17 DIAGNOSIS — I482 Chronic atrial fibrillation, unspecified: Secondary | ICD-10-CM | POA: Diagnosis not present

## 2020-04-17 DIAGNOSIS — N183 Chronic kidney disease, stage 3 unspecified: Secondary | ICD-10-CM | POA: Diagnosis not present

## 2020-04-17 DIAGNOSIS — E782 Mixed hyperlipidemia: Secondary | ICD-10-CM | POA: Diagnosis not present

## 2020-04-17 DIAGNOSIS — I10 Essential (primary) hypertension: Secondary | ICD-10-CM | POA: Diagnosis not present

## 2020-04-24 DIAGNOSIS — I700 Atherosclerosis of aorta: Secondary | ICD-10-CM | POA: Diagnosis not present

## 2020-04-24 DIAGNOSIS — Z23 Encounter for immunization: Secondary | ICD-10-CM | POA: Diagnosis not present

## 2020-04-24 DIAGNOSIS — N1832 Chronic kidney disease, stage 3b: Secondary | ICD-10-CM | POA: Diagnosis not present

## 2020-04-24 DIAGNOSIS — D62 Acute posthemorrhagic anemia: Secondary | ICD-10-CM | POA: Diagnosis not present

## 2020-04-24 DIAGNOSIS — I4891 Unspecified atrial fibrillation: Secondary | ICD-10-CM | POA: Diagnosis not present

## 2020-04-24 DIAGNOSIS — I5022 Chronic systolic (congestive) heart failure: Secondary | ICD-10-CM | POA: Diagnosis not present

## 2020-04-24 DIAGNOSIS — N183 Chronic kidney disease, stage 3 unspecified: Secondary | ICD-10-CM | POA: Diagnosis not present

## 2020-04-24 DIAGNOSIS — I130 Hypertensive heart and chronic kidney disease with heart failure and stage 1 through stage 4 chronic kidney disease, or unspecified chronic kidney disease: Secondary | ICD-10-CM | POA: Diagnosis not present

## 2020-04-24 DIAGNOSIS — K219 Gastro-esophageal reflux disease without esophagitis: Secondary | ICD-10-CM | POA: Diagnosis not present

## 2020-05-01 DIAGNOSIS — R040 Epistaxis: Secondary | ICD-10-CM | POA: Diagnosis not present

## 2020-05-01 DIAGNOSIS — J310 Chronic rhinitis: Secondary | ICD-10-CM | POA: Diagnosis not present

## 2020-05-01 DIAGNOSIS — H6123 Impacted cerumen, bilateral: Secondary | ICD-10-CM | POA: Diagnosis not present

## 2020-05-04 DIAGNOSIS — Z947 Corneal transplant status: Secondary | ICD-10-CM | POA: Diagnosis not present

## 2020-05-04 DIAGNOSIS — H401131 Primary open-angle glaucoma, bilateral, mild stage: Secondary | ICD-10-CM | POA: Diagnosis not present

## 2020-05-09 DIAGNOSIS — N1832 Chronic kidney disease, stage 3b: Secondary | ICD-10-CM | POA: Diagnosis not present

## 2020-05-09 DIAGNOSIS — D62 Acute posthemorrhagic anemia: Secondary | ICD-10-CM | POA: Diagnosis not present

## 2020-06-11 DIAGNOSIS — D649 Anemia, unspecified: Secondary | ICD-10-CM | POA: Diagnosis not present

## 2020-06-28 ENCOUNTER — Ambulatory Visit: Payer: Medicare HMO

## 2020-07-09 DIAGNOSIS — R600 Localized edema: Secondary | ICD-10-CM | POA: Diagnosis not present

## 2020-07-09 DIAGNOSIS — N1832 Chronic kidney disease, stage 3b: Secondary | ICD-10-CM | POA: Diagnosis not present

## 2020-07-09 DIAGNOSIS — I10 Essential (primary) hypertension: Secondary | ICD-10-CM | POA: Diagnosis not present

## 2020-07-31 DIAGNOSIS — E782 Mixed hyperlipidemia: Secondary | ICD-10-CM | POA: Diagnosis not present

## 2020-07-31 DIAGNOSIS — I10 Essential (primary) hypertension: Secondary | ICD-10-CM | POA: Diagnosis not present

## 2020-07-31 DIAGNOSIS — I482 Chronic atrial fibrillation, unspecified: Secondary | ICD-10-CM | POA: Diagnosis not present

## 2020-07-31 DIAGNOSIS — I340 Nonrheumatic mitral (valve) insufficiency: Secondary | ICD-10-CM | POA: Diagnosis not present

## 2020-07-31 DIAGNOSIS — I2510 Atherosclerotic heart disease of native coronary artery without angina pectoris: Secondary | ICD-10-CM | POA: Diagnosis not present

## 2020-07-31 DIAGNOSIS — I5022 Chronic systolic (congestive) heart failure: Secondary | ICD-10-CM | POA: Diagnosis not present

## 2020-07-31 DIAGNOSIS — I700 Atherosclerosis of aorta: Secondary | ICD-10-CM | POA: Diagnosis not present

## 2020-08-08 DIAGNOSIS — I2510 Atherosclerotic heart disease of native coronary artery without angina pectoris: Secondary | ICD-10-CM | POA: Diagnosis not present

## 2020-08-08 DIAGNOSIS — E782 Mixed hyperlipidemia: Secondary | ICD-10-CM | POA: Diagnosis not present

## 2020-08-08 DIAGNOSIS — I10 Essential (primary) hypertension: Secondary | ICD-10-CM | POA: Diagnosis not present

## 2020-08-08 DIAGNOSIS — I482 Chronic atrial fibrillation, unspecified: Secondary | ICD-10-CM | POA: Diagnosis not present

## 2020-08-08 DIAGNOSIS — I5022 Chronic systolic (congestive) heart failure: Secondary | ICD-10-CM | POA: Diagnosis not present

## 2020-08-28 DIAGNOSIS — N1832 Chronic kidney disease, stage 3b: Secondary | ICD-10-CM | POA: Diagnosis not present

## 2020-08-28 DIAGNOSIS — I10 Essential (primary) hypertension: Secondary | ICD-10-CM | POA: Diagnosis not present

## 2020-08-28 DIAGNOSIS — I482 Chronic atrial fibrillation, unspecified: Secondary | ICD-10-CM | POA: Diagnosis not present

## 2020-08-28 DIAGNOSIS — G3184 Mild cognitive impairment, so stated: Secondary | ICD-10-CM | POA: Diagnosis not present

## 2020-08-28 DIAGNOSIS — E519 Thiamine deficiency, unspecified: Secondary | ICD-10-CM | POA: Diagnosis not present

## 2020-08-28 DIAGNOSIS — E538 Deficiency of other specified B group vitamins: Secondary | ICD-10-CM | POA: Diagnosis not present

## 2020-08-29 ENCOUNTER — Other Ambulatory Visit: Payer: Self-pay | Admitting: Neurology

## 2020-08-29 ENCOUNTER — Other Ambulatory Visit (HOSPITAL_COMMUNITY): Payer: Self-pay | Admitting: Neurology

## 2020-08-29 DIAGNOSIS — G3184 Mild cognitive impairment, so stated: Secondary | ICD-10-CM

## 2020-09-12 ENCOUNTER — Other Ambulatory Visit: Payer: Self-pay

## 2020-09-12 ENCOUNTER — Ambulatory Visit (HOSPITAL_COMMUNITY)
Admission: RE | Admit: 2020-09-12 | Discharge: 2020-09-12 | Disposition: A | Discharge status: Home / Self Care | Payer: Medicare HMO | Source: Ambulatory Visit | Attending: Neurology | Admitting: Neurology

## 2020-09-12 DIAGNOSIS — R930 Abnormal findings on diagnostic imaging of skull and head, not elsewhere classified: Secondary | ICD-10-CM | POA: Diagnosis not present

## 2020-09-12 DIAGNOSIS — G3184 Mild cognitive impairment, so stated: Secondary | ICD-10-CM | POA: Insufficient documentation

## 2020-09-12 DIAGNOSIS — I6782 Cerebral ischemia: Secondary | ICD-10-CM | POA: Diagnosis not present

## 2020-09-18 ENCOUNTER — Emergency Department: Payer: Medicare HMO

## 2020-09-18 ENCOUNTER — Inpatient Hospital Stay
Admission: EM | Admit: 2020-09-18 | Discharge: 2020-09-20 | DRG: 556 | Disposition: A | Discharge status: Home Health | Payer: Medicare HMO | Attending: Hospitalist | Admitting: Hospitalist

## 2020-09-18 ENCOUNTER — Other Ambulatory Visit: Payer: Self-pay

## 2020-09-18 DIAGNOSIS — R1031 Right lower quadrant pain: Secondary | ICD-10-CM | POA: Diagnosis not present

## 2020-09-18 DIAGNOSIS — T45515A Adverse effect of anticoagulants, initial encounter: Secondary | ICD-10-CM | POA: Diagnosis present

## 2020-09-18 DIAGNOSIS — I480 Paroxysmal atrial fibrillation: Secondary | ICD-10-CM | POA: Diagnosis not present

## 2020-09-18 DIAGNOSIS — S32512A Fracture of superior rim of left pubis, initial encounter for closed fracture: Secondary | ICD-10-CM | POA: Diagnosis not present

## 2020-09-18 DIAGNOSIS — M7981 Nontraumatic hematoma of soft tissue: Principal | ICD-10-CM | POA: Diagnosis present

## 2020-09-18 DIAGNOSIS — Z8249 Family history of ischemic heart disease and other diseases of the circulatory system: Secondary | ICD-10-CM

## 2020-09-18 DIAGNOSIS — Z87891 Personal history of nicotine dependence: Secondary | ICD-10-CM | POA: Diagnosis not present

## 2020-09-18 DIAGNOSIS — M1611 Unilateral primary osteoarthritis, right hip: Secondary | ICD-10-CM | POA: Diagnosis not present

## 2020-09-18 DIAGNOSIS — S32502A Unspecified fracture of left pubis, initial encounter for closed fracture: Secondary | ICD-10-CM | POA: Diagnosis not present

## 2020-09-18 DIAGNOSIS — E039 Hypothyroidism, unspecified: Secondary | ICD-10-CM | POA: Diagnosis present

## 2020-09-18 DIAGNOSIS — M7061 Trochanteric bursitis, right hip: Secondary | ICD-10-CM

## 2020-09-18 DIAGNOSIS — Z888 Allergy status to other drugs, medicaments and biological substances status: Secondary | ICD-10-CM | POA: Diagnosis not present

## 2020-09-18 DIAGNOSIS — Z9049 Acquired absence of other specified parts of digestive tract: Secondary | ICD-10-CM | POA: Diagnosis not present

## 2020-09-18 DIAGNOSIS — R609 Edema, unspecified: Secondary | ICD-10-CM | POA: Diagnosis not present

## 2020-09-18 DIAGNOSIS — M47816 Spondylosis without myelopathy or radiculopathy, lumbar region: Secondary | ICD-10-CM | POA: Diagnosis not present

## 2020-09-18 DIAGNOSIS — Z885 Allergy status to narcotic agent status: Secondary | ICD-10-CM | POA: Diagnosis not present

## 2020-09-18 DIAGNOSIS — I5022 Chronic systolic (congestive) heart failure: Secondary | ICD-10-CM | POA: Diagnosis not present

## 2020-09-18 DIAGNOSIS — M810 Age-related osteoporosis without current pathological fracture: Secondary | ICD-10-CM | POA: Diagnosis present

## 2020-09-18 DIAGNOSIS — I712 Thoracic aortic aneurysm, without rupture: Secondary | ICD-10-CM | POA: Diagnosis present

## 2020-09-18 DIAGNOSIS — I10 Essential (primary) hypertension: Secondary | ICD-10-CM | POA: Diagnosis present

## 2020-09-18 DIAGNOSIS — K5730 Diverticulosis of large intestine without perforation or abscess without bleeding: Secondary | ICD-10-CM | POA: Diagnosis not present

## 2020-09-18 DIAGNOSIS — R262 Difficulty in walking, not elsewhere classified: Secondary | ICD-10-CM | POA: Diagnosis present

## 2020-09-18 DIAGNOSIS — Z7901 Long term (current) use of anticoagulants: Secondary | ICD-10-CM

## 2020-09-18 DIAGNOSIS — N183 Chronic kidney disease, stage 3 unspecified: Secondary | ICD-10-CM | POA: Diagnosis not present

## 2020-09-18 DIAGNOSIS — S32592A Other specified fracture of left pubis, initial encounter for closed fracture: Secondary | ICD-10-CM | POA: Diagnosis not present

## 2020-09-18 DIAGNOSIS — I4891 Unspecified atrial fibrillation: Secondary | ICD-10-CM | POA: Diagnosis present

## 2020-09-18 DIAGNOSIS — Z79890 Hormone replacement therapy (postmenopausal): Secondary | ICD-10-CM

## 2020-09-18 DIAGNOSIS — R531 Weakness: Secondary | ICD-10-CM | POA: Diagnosis not present

## 2020-09-18 DIAGNOSIS — Z20822 Contact with and (suspected) exposure to covid-19: Secondary | ICD-10-CM | POA: Diagnosis not present

## 2020-09-18 DIAGNOSIS — Z79899 Other long term (current) drug therapy: Secondary | ICD-10-CM

## 2020-09-18 DIAGNOSIS — I130 Hypertensive heart and chronic kidney disease with heart failure and stage 1 through stage 4 chronic kidney disease, or unspecified chronic kidney disease: Secondary | ICD-10-CM | POA: Diagnosis not present

## 2020-09-18 DIAGNOSIS — E785 Hyperlipidemia, unspecified: Secondary | ICD-10-CM | POA: Diagnosis present

## 2020-09-18 LAB — CBC WITH DIFFERENTIAL/PLATELET
Abs Immature Granulocytes: 0.03 10*3/uL (ref 0.00–0.07)
Basophils Absolute: 0.1 10*3/uL (ref 0.0–0.1)
Basophils Relative: 1 %
Eosinophils Absolute: 0.7 10*3/uL — ABNORMAL HIGH (ref 0.0–0.5)
Eosinophils Relative: 8 %
HCT: 35.9 % — ABNORMAL LOW (ref 36.0–46.0)
Hemoglobin: 11.4 g/dL — ABNORMAL LOW (ref 12.0–15.0)
Immature Granulocytes: 0 %
Lymphocytes Relative: 23 %
Lymphs Abs: 2.1 10*3/uL (ref 0.7–4.0)
MCH: 29.4 pg (ref 26.0–34.0)
MCHC: 31.8 g/dL (ref 30.0–36.0)
MCV: 92.5 fL (ref 80.0–100.0)
Monocytes Absolute: 0.8 10*3/uL (ref 0.1–1.0)
Monocytes Relative: 9 %
Neutro Abs: 5.4 10*3/uL (ref 1.7–7.7)
Neutrophils Relative %: 59 %
Platelets: 149 10*3/uL — ABNORMAL LOW (ref 150–400)
RBC: 3.88 MIL/uL (ref 3.87–5.11)
RDW: 17.2 % — ABNORMAL HIGH (ref 11.5–15.5)
WBC: 9.2 10*3/uL (ref 4.0–10.5)
nRBC: 0 % (ref 0.0–0.2)

## 2020-09-18 LAB — COMPREHENSIVE METABOLIC PANEL
ALT: 12 U/L (ref 0–44)
AST: 21 U/L (ref 15–41)
Albumin: 4.2 g/dL (ref 3.5–5.0)
Alkaline Phosphatase: 54 U/L (ref 38–126)
Anion gap: 15 (ref 5–15)
BUN: 20 mg/dL (ref 8–23)
CO2: 22 mmol/L (ref 22–32)
Calcium: 9.7 mg/dL (ref 8.9–10.3)
Chloride: 103 mmol/L (ref 98–111)
Creatinine, Ser: 1.23 mg/dL — ABNORMAL HIGH (ref 0.44–1.00)
GFR, Estimated: 44 mL/min — ABNORMAL LOW (ref >60)
Glucose, Bld: 122 mg/dL — ABNORMAL HIGH (ref 70–99)
Potassium: 4 mmol/L (ref 3.5–5.1)
Sodium: 140 mmol/L (ref 135–145)
Total Bilirubin: 0.5 mg/dL (ref 0.3–1.2)
Total Protein: 7 g/dL (ref 6.5–8.1)

## 2020-09-18 LAB — SARS CORONAVIRUS 2 (TAT 6-24 HRS): SARS Coronavirus 2: NEGATIVE

## 2020-09-18 LAB — LACTIC ACID, PLASMA: Lactic Acid, Venous: 1.7 mmol/L (ref 0.5–1.9)

## 2020-09-18 LAB — CK: Total CK: 59 U/L (ref 38–234)

## 2020-09-18 MED ORDER — METHOCARBAMOL 500 MG PO TABS: 500.0000 mg | ORAL_TABLET | Freq: Four times a day (QID) | ORAL | 0 refills | Status: DC | PRN

## 2020-09-18 MED ORDER — METHOCARBAMOL 500 MG PO TABS
500.0000 mg | ORAL_TABLET | Freq: Once | ORAL | Status: AC
  Administered 2020-09-18: 500 mg via ORAL
  Filled 2020-09-18: qty 1

## 2020-09-18 MED ORDER — ONDANSETRON HCL 4 MG/2ML IJ SOLN
4.0000 mg | Freq: Four times a day (QID) | INTRAMUSCULAR | Status: DC | PRN
Start: 2020-09-18 — End: 2020-09-18

## 2020-09-18 MED ORDER — ONDANSETRON HCL 4 MG PO TABS: 4.0000 mg | ORAL_TABLET | Freq: Four times a day (QID) | ORAL | Status: DC | PRN

## 2020-09-18 MED ORDER — ATORVASTATIN CALCIUM 20 MG PO TABS
40.0000 mg | ORAL_TABLET | Freq: Every day | ORAL | Status: DC
  Administered 2020-09-18 – 2020-09-19 (×2): 40 mg via ORAL
  Filled 2020-09-18 (×2): qty 2

## 2020-09-18 MED ORDER — HYDROCODONE-ACETAMINOPHEN 5-325 MG PO TABS: 1.0000 | ORAL_TABLET | Freq: Four times a day (QID) | ORAL | 0 refills | Status: DC | PRN

## 2020-09-18 MED ORDER — FENTANYL CITRATE (PF) 100 MCG/2ML IJ SOLN
25.0000 ug | Freq: Once | INTRAMUSCULAR | Status: AC
  Administered 2020-09-18: 25 ug via INTRAVENOUS
  Filled 2020-09-18: qty 2

## 2020-09-18 MED ORDER — HYDROCODONE-ACETAMINOPHEN 5-325 MG PO TABS
1.0000 | ORAL_TABLET | Freq: Once | ORAL | Status: AC
  Administered 2020-09-18: 1 via ORAL
  Filled 2020-09-18: qty 1

## 2020-09-18 MED ORDER — METOPROLOL TARTRATE 5 MG/5ML IV SOLN
5.0000 mg | INTRAVENOUS | Status: DC | PRN
Start: 2020-09-18 — End: 2020-09-20

## 2020-09-18 MED ORDER — ONDANSETRON 4 MG PO TBDP: 4.0000 mg | ORAL_TABLET | Freq: Three times a day (TID) | ORAL | 0 refills | Status: DC | PRN

## 2020-09-18 MED ORDER — ACETAMINOPHEN 325 MG PO TABS
325.0000 mg | ORAL_TABLET | Freq: Four times a day (QID) | ORAL | Status: DC | PRN
  Administered 2020-09-19: 325 mg via ORAL
  Filled 2020-09-18: qty 1

## 2020-09-18 MED ORDER — ONDANSETRON 4 MG PO TBDP
4.0000 mg | ORAL_TABLET | Freq: Once | ORAL | Status: AC
  Administered 2020-09-18: 4 mg via ORAL
  Filled 2020-09-18: qty 1

## 2020-09-18 MED ORDER — RIVAROXABAN 15 MG PO TABS
15.0000 mg | ORAL_TABLET | Freq: Every day | ORAL | Status: DC
Start: 2020-09-18 — End: 2020-09-20
  Administered 2020-09-18 – 2020-09-19 (×2): 15 mg via ORAL
  Filled 2020-09-18 (×3): qty 1

## 2020-09-18 MED ORDER — METOPROLOL TARTRATE 50 MG PO TABS
100.0000 mg | ORAL_TABLET | Freq: Every day | ORAL | Status: DC
  Administered 2020-09-18 – 2020-09-20 (×3): 100 mg via ORAL
  Filled 2020-09-18 (×3): qty 2

## 2020-09-18 MED ORDER — LEVOTHYROXINE SODIUM 88 MCG PO TABS
88.0000 ug | ORAL_TABLET | Freq: Every day | ORAL | Status: DC
  Administered 2020-09-20: 88 ug via ORAL
  Filled 2020-09-18 (×2): qty 1

## 2020-09-18 MED ORDER — ONDANSETRON HCL 4 MG/2ML IJ SOLN
4.0000 mg | Freq: Once | INTRAMUSCULAR | Status: AC
  Administered 2020-09-18: 4 mg via INTRAVENOUS
  Filled 2020-09-18: qty 2

## 2020-09-18 MED ORDER — ONDANSETRON HCL 4 MG/2ML IJ SOLN: 4.0000 mg | Freq: Four times a day (QID) | INTRAMUSCULAR | Status: DC | PRN

## 2020-09-18 MED ORDER — ACETAMINOPHEN 650 MG RE SUPP: 325.0000 mg | Freq: Four times a day (QID) | RECTAL | Status: DC | PRN

## 2020-09-18 MED ORDER — FENTANYL CITRATE (PF) 100 MCG/2ML IJ SOLN: 12.5000 ug | INTRAMUSCULAR | Status: DC | PRN

## 2020-09-18 NOTE — ED Notes (Signed)
Pt tried to walk with out any assistance, however she was unable to put any weight on her right leg. She said her thigh area hurts very badly. She even tried applying more weight using a walker and was still unsuccessful. Dr. Wynona Neat saw pt in hallway trying to attempt this with the help of tech and told pt she should be admitted to the hospital at this point.  Pt was then wheeled to the bathroom where she stood with the help of the tech and wall bars.  Pt asked for something to eat and said she hasnt had anything to eat or drink since she arrived. Tech provided pt with lunch tray and drink and also got pt husband a snack as well. Pt is still in wheelchair still while she eats her lunch so that she could be in a better seated position.

## 2020-09-18 NOTE — ED Provider Notes (Signed)
Despite p.o. and IV pain medications the patient is unable to ambulate even using a walker, have discussed with Dr. Tobie Poet of the hospital service for admission   Lavonia Drafts, MD 09/18/20 1237

## 2020-09-18 NOTE — ED Triage Notes (Signed)
Pt brought in from home by [facility] for right groin pain, it is tender to touch. States started last night and now unable to sit up or stand due to pain. Denies any hx of the same,

## 2020-09-18 NOTE — ED Notes (Signed)
Pt given meal tray at this time 

## 2020-09-18 NOTE — ED Notes (Signed)
Pt assisted to bed with gait belt and 2nd RN.

## 2020-09-18 NOTE — H&P (Signed)
History and Physical   Brittney Ramirez BHA:[id number] DOB: 20-Sep-1940 DOA: 09/18/2020  PCP: Adin Hector, MD  Patient coming from: home  I have personally briefly reviewed patient's old medical records in [place].  Chief Concern: abdominal pain  HPI: Brittney Ramirez is a 80 y.o. female with medical history significant for atrial fibrillation on Xarelto, hyperlipidemia, hypertension, hypothyroid, presented to the emergency department for right hip pain.  She denies falling or trauma.  She states the pain started on 09/17/2020.  She states that she was cleaning her house and going in and out of cabinets when she noticed the pain.  She denies shortness of breath, chest pain, dysuria, hematuria.  In the emergency department, CT of the abdomen and pelvis without contrast was completed and showed partially visualized 7 cm heterogeneous lobulated soft tissue lesion along the right greater trochanter.  Social history: She lives with her spouse of 36 years.  She denies tobacco, EtOH, recreational drug use.  ROS: Constitutional: no weight change, no fever ENT/Mouth: no sore throat, no rhinorrhea Eyes: no eye pain, no vision changes Cardiovascular: no chest pain, no dyspnea,  no edema, no palpitations Respiratory: no cough, no sputum, no wheezing Gastrointestinal: no nausea, no vomiting, no diarrhea, no constipation Genitourinary: no urinary incontinence, no dysuria, no hematuria Musculoskeletal: no arthralgias, + myalgias of the right hip and leg Skin: no skin lesions, no pruritus, Neuro: + weakness, no loss of consciousness, no syncope Psych: no anxiety, no depression, + decrease appetite Heme/Lymph: no bruising, no bleeding  ED Course: Discussed with ED provider, patient requiring hospitalization due to pain in her leg and inability to ambulate.  Assessment/Plan  Principal Problem:   Weakness Active Problems:   CHF (congestive heart failure), NYHA class II, chronic, systolic  (HCC)   Atrial fibrillation (H[facility])   Hypothyroidism   Hyperlipidemia   Benign essential HTN   Generalized weakness-no focal neuro deficit Right hip pain -PT, OT, TOC -Admit to MedSurg with observation -Fentanyl 12.5 mcg IV every 3 hours as needed for moderate and severe pain, 4 doses ordered  7 cm lobulated soft tissue lesion-suspect hematoma in setting of Xarelto use -Monitor  Atrial fibrillation-resume Xarelto and metoprolol  Hypertension-resumed metoprolol tartrate 100 mg daily  Hyperlipidemia-resumed atorvastatin 40 mg nightly  Chart reviewed.   DVT prophylaxis: Xarelto resumed Code Status: Full code Diet: Heart healthy Family Communication: Updated spouse at bedside Disposition Plan: Pending clinical course and PT evaluation Consults called: PT, OT, TOC Admission status: Observation to MedSurg without telemetry  Past Medical History:  Diagnosis Date  . Arm fracture   . Atrial fibrillation ([place])   . Chronic kidney disease   . Elevated glucose   . Hyperlipidemia   . Hypertension   . Jaw fracture ([place])   . Miscarriage   . Osteoporosis   . Sleep apnea   . Thyroid cyst    Past Surgical History:  Procedure Laterality Date  . CATARACT EXTRACTION Right   . CHOLECYSTECTOMY  2014  . JOINT REPLACEMENT Right 2003   Social History:  reports that she quit smoking about 65 years ago. She started smoking about 65 years ago. She has never used smokeless tobacco. She reports that she does not drink alcohol and does not use drugs.  Allergies  Allergen Reactions  . Morphine And Related Nausea And Vomiting    Diarrhea  . Alendronate Rash  . Buprenorphine Hcl Nausea And Vomiting    Diarrhea  . Codeine Nausea Only  Other reaction(s): Other (See Comments) GI upset GI upset   . Desipramine Nausea Only   Family History  Problem Relation Age of Onset  . Heart disease Mother   . Hypertension Mother   . Heart disease Father   . Hypertension Father   . Arthritis  Sister   . Cancer Sister   . Diabetes Sister   . Heart disease Sister   . Breast cancer Sister 77  . Diabetes Brother   . Heart disease Brother   . Hypertension Brother    Family history: Family history reviewed and not pertinent  Prior to Admission medications   Medication Sig Start Date End Date Taking? Authorizing Provider  HYDROcodone-acetaminophen (NORCO) 5-325 MG tablet Take 1 tablet by mouth every 6 (six) hours as needed for moderate pain. 09/18/20  Yes Paulette Blanch, MD  methocarbamol (ROBAXIN) 500 MG tablet Take 1 tablet (500 mg total) by mouth every 6 (six) hours as needed for muscle spasms. 09/18/20  Yes Paulette Blanch, MD  ondansetron (ZOFRAN ODT) 4 MG disintegrating tablet Take 1 tablet (4 mg total) by mouth every 8 (eight) hours as needed for nausea or vomiting. 09/18/20  Yes Paulette Blanch, MD  atorvastatin (LIPITOR) 40 MG tablet Take 1 tablet (40 mg total) by mouth at bedtime. 07/29/19   Volney American, PA-C  latanoprost (XALATAN) 0.005 % ophthalmic solution  08/20/14   [provider]  levothyroxine (SYNTHROID) 88 MCG tablet Take 1 tablet (88 mcg total) by mouth daily before breakfast. 07/29/19   Volney American, PA-C  metoprolol tartrate (LOPRESSOR) 100 MG tablet Take 100 mg by mouth daily.    [provider]  omeprazole (PRILOSEC) 20 MG capsule Take 1 capsule (20 mg total) by mouth daily. 01/11/19   Guadalupe Maple, MD  rivaroxaban (XARELTO) 20 MG TABS tablet TAKE 1 TABLET (20 MG TOTAL) BY MOUTH ONCE DAILY. 01/05/18   [provider]  valsartan-hydrochlorothiazide (DIOVAN-HCT) 160-25 MG tablet Take 1 tablet by mouth daily. 07/29/19   Volney American, PA-C   Physical Exam: Vitals:   09/18/20 1630 09/18/20 1811 09/18/20 2004 09/19/20 0049  BP: 131/66 (!) 147/95 (!) 142/103 114/84  Pulse: 98 90 89 (!) 102  Resp: 18 18 18 20   Temp:  97.9 F (36.6 C) 98.4 F (36.9 C) 98.9 F (37.2 C)  TempSrc:  Oral Oral Oral  SpO2: 100% 100% 96% 99%   Weight:      Height:       Constitutional: appears age appropriate, NAD, calm, comfortable Eyes: PERRL, lids and conjunctivae normal ENMT: Mucous membranes are moist. Posterior pharynx clear of any exudate or lesions. Age-appropriate dentition. Hearing appropriate Neck: normal, supple, no masses, no thyromegaly Respiratory: clear to auscultation bilaterally, no wheezing, no crackles. Normal respiratory effort. No accessory muscle use.  Cardiovascular: Regular rate and rhythm, no murmurs / rubs / gallops. No extremity edema. 2+ pedal pulses. No carotid bruits.  Abdomen: no tenderness, no masses palpated, no hepatosplenomegaly. Bowel sounds positive.  Musculoskeletal: no clubbing / cyanosis. No joint deformity upper and lower extremities. Good ROM, no contractures, no atrophy. Normal muscle tone.  Skin: no rashes, lesions, ulcers. No induration Neurologic: Sensation intact. Strength 5/5 in all 4.  Psychiatric: Normal judgment and insight. Alert and oriented x 3. Normal mood.   EKG: not indicated  x-ray on Admission: I personally reviewed and I agree with radiologist reading as below.  CT Abdomen Pelvis Wo Contrast  Result Date: 09/18/2020 CLINICAL DATA:  Abdominal  pain nonlocalized. EXAM: CT ABDOMEN AND PELVIS WITHOUT CONTRAST TECHNIQUE: Multidetector CT imaging of the abdomen and pelvis was performed following the standard protocol without IV contrast. COMPARISON:  CT chest 11/28/2016, CT abdomen pelvis 04/02/2015. FINDINGS: Lower chest: Bibasilar subsegmental atelectasis. Right lower lobe linear scarring versus atelectasis.Aortic root calcifications. Coronary artery calcifications. Hepatobiliary: No focal liver abnormality. Status post cholecystectomy. No biliary dilatation. Pancreas: No focal lesion. Normal pancreatic contour. No surrounding inflammatory changes. No main pancreatic ductal dilatation. Spleen: Normal in size without focal abnormality. Adrenals/Urinary Tract: No adrenal nodule  bilaterally. Similar-appearing 1.6 cm coarsely calcified exophytic lesion arising from the right kidney versus associated adjacent fat necrosis. Subcentimeter hypodensities are too small to characterize. No nephrolithiasis, no hydronephrosis, and no contour-deforming renal mass. No ureterolithiasis or hydroureter. The urinary bladder is unremarkable. Stomach/Bowel: Stomach is within normal limits. No evidence of bowel wall thickening or dilatation. Diffuse sigmoid diverticulosis. Scattered colonic diverticulosis of the remaining large bowel. Fatty infiltration of the ascending colon wall suggestive of chronic inflammatory changes. The appendix not definitely identified. Vascular/Lymphatic: No abdominal aorta or iliac aneurysm. Moderate atherosclerotic plaque of the aorta and its branches. No abdominal, pelvic, or inguinal lymphadenopathy. Reproductive: Uterus and bilateral adnexa are unremarkable. Other: No intraperitoneal free fluid. No intraperitoneal free gas. No organized fluid collection. Musculoskeletal: Partially visualized heterogeneous lobulated soft tissue lesion along the right greater trochanter (2:83). This lesion measures at least up to 7 cm. No suspicious lytic or blastic osseous lesions. No acute displaced fracture. Old healed left superior inferior pubic rami fractures. Multilevel severe degenerative changes of the spine with intervertebral disc space vacuum phenomenon and endplate sclerosis. IMPRESSION: 1. Interval development of a partially visualized at least 7cm heterogeneous lobulated soft tissue lesion along the right greater trochanter. Finding could represent bursitis versus hematoma versus mass lesion formation. 2. Colonic diverticulosis with no acute diverticulitis. Electronically Signed   By: Iven Finn M.D.   On: 09/18/2020 04:17   DG Hip Unilat With Pelvis 2-3 Views Right  Result Date: 09/18/2020 CLINICAL DATA:  Right groin pain EXAM: DG HIP (WITH OR WITHOUT PELVIS) 3V RIGHT  COMPARISON:  02/08/2015 FINDINGS: Pelvic ring again demonstrates old healed left pubic rami fracture stable from the prior exam. No acute fracture is seen. Degenerative changes of the hip joints and lumbar spine are seen. No acute fracture or dislocation is noted. No soft tissue abnormality is seen. IMPRESSION: Chronic changes without acute abnormality. Electronically Signed   By: Inez Catalina M.D.   On: 09/18/2020 01:51   Labs on Admission: I have personally reviewed following labs  CBC: Recent Labs  Lab 09/18/20 0113  WBC 9.2  NEUTROABS 5.4  HGB 11.4*  HCT 35.9*  MCV 92.5  PLT [id number]*   Basic Metabolic Panel: Recent Labs  Lab 09/18/20 0113  NA 140  K 4.0  CL 103  CO2 22  GLUCOSE 122*  BUN 20  CREATININE 1.23*  CALCIUM 9.7   GFR: Estimated Creatinine Clearance: 38.6 mL/min (A) (by C-G formula based on SCr of 1.23 mg/dL (H)).  Liver Function Tests: Recent Labs  Lab 09/18/20 0113  AST 21  ALT 12  ALKPHOS 54  BILITOT 0.5  PROT 7.0  ALBUMIN 4.2   Cardiac Enzymes: Recent Labs  Lab 09/18/20 0113  CKTOTAL 59   Urine analysis:    Component Value Date/Time   APPEARANCEUR Cloudy (A) 06/17/2018 1017   GLUCOSEU Negative 06/17/2018 1017   BILIRUBINUR Negative 06/17/2018 1017   PROTEINUR Negative 06/17/2018 1017   NITRITE Negative  06/17/2018 1017   LEUKOCYTESUR 1+ (A) 06/17/2018 1017   Pavneet Markwood N Shakedra Beam D.O. [facility]  If 7PM-7AM, please contact overnight-coverage provider If 7AM-7PM, please contact day coverage provider [email or web address]  09/19/2020, 12:54 AM

## 2020-09-18 NOTE — ED Notes (Signed)
PT ambulated with walker. Pt is unsteady and needs more than one assistance with walker. Will notify MD

## 2020-09-18 NOTE — Discharge Instructions (Addendum)
You may take medicines as needed for pain & muscle spasms, as well as medicine as needed for nausea from pain medicine (Norco/Zofran/Robaxin). You may apply ice or heat to affected area, whichever make it feel better. I recommend you hold Xarelto x 3 days, as the cause of your pain may be a hematoma (bad bruise, collection of blood). You may speak with your cardiologist if you are uncomfortable holding Xarelto. Use your walker to help you balance as you walk. Return to the ER for worsening symptoms, persistent vomiting, difficulty breathing or other concerns.

## 2020-09-18 NOTE — ED Triage Notes (Signed)
EMS brings pt in from home for "knot" to rt upper leg x 6hrs

## 2020-09-18 NOTE — Evaluation (Signed)
Physical Therapy Evaluation Patient Details Name: Brittney Ramirez MRN: [id number] DOB: 1940/12/21 Today's Date: 09/18/2020   History of Present Illness  Brittney Ramirez , a 80 y.o. female  was evaluated in triage.  Pt complains of nontraumatic right groin pain. Cleaning and organizing her house yesterday. Pain began last night. Feels like cramps when drawing her leg up. Exacerbated by movement. Relieved by rest. History of same with unremarkable work-up.  Clinical Impression  Patient received in wheelchair in hallway. She is agreeable to PT/OT assessment.  Patient requires mod assist +2 for sit to stand and to move R LE off wheelchair foot rest. She is able to ambulate a few feet with RW and min A +2. Cues for sequencing with RW. Patient will continue to benefit from skilled PT while here to improve functional mobility and independence. At this point there would be now way for her to get up steps to get into her home or basic mobility, therefore recommending SNF at this time.         Follow Up Recommendations SNF    Equipment Recommendations  Rolling walker with 5" wheels    Recommendations for Other Services       Precautions / Restrictions Precautions Precautions: None Restrictions Weight Bearing Restrictions: No      Mobility  Bed Mobility               General bed mobility comments: not assessed, patient received in wheelchair in hallway    Transfers Overall transfer level: Needs assistance Equipment used: Rolling walker (2 wheeled) Transfers: Sit to/from Stand Sit to Stand: Mod assist;+2 physical assistance         General transfer comment: cues for hand placement with transferring. Mod assist to get balance and fully upright.  Ambulation/Gait Ambulation/Gait assistance: Min assist;+2 physical assistance Gait Distance (Feet): 4 Feet Assistive device: Rolling walker (2 wheeled) Gait Pattern/deviations: Step-to pattern;Decreased step length - right;Decreased weight  shift to right Gait velocity: decr   General Gait Details: patient requires cues for sequencing, safety. Ambulation distance limited by pain in R LE.  Stairs            Wheelchair Mobility    Modified Rankin (Stroke Patients Only)       Balance Overall balance assessment: Modified Independent                                           Pertinent Vitals/Pain Pain Assessment: Faces Faces Pain Scale: Hurts little more Pain Location: R hip/groin Pain Descriptors / Indicators: Aching;Guarding;Grimacing;Sore;Shooting Pain Intervention(s): Limited activity within patient's tolerance;Monitored during session;Repositioned;Premedicated before session    [place] expects to be discharged to:: Private residence Living Arrangements: Spouse/significant other Available Help at Discharge: Available 24 hours/day;Family Type of Home: House Home Access: Stairs to enter Entrance Stairs-Rails: Right;Left;Can reach both Technical brewer of Steps: 3-4 Home Layout: One level Home Equipment: [place] - single point      Prior Function Level of Independence: Independent         Comments: uses cane when outside of the home     Hand Dominance        Extremity/Trunk Assessment   Upper Extremity Assessment Upper Extremity Assessment: Defer to OT evaluation    Lower Extremity Assessment Lower Extremity Assessment: RLE deficits/detail RLE Deficits / Details: pain in right hip. Difficulty lifting R LE, WBing on R  LE RLE: Unable to fully assess due to pain RLE Sensation: WNL RLE Coordination: decreased gross motor    Cervical / Trunk Assessment Cervical / Trunk Assessment: Normal  Communication   Communication: No difficulties  Cognition Arousal/Alertness: Awake/alert Behavior During Therapy: WFL for tasks assessed/performed Overall Cognitive Status: Within Functional Limits for tasks assessed                                         General Comments      Exercises     Assessment/Plan    PT Assessment Patient needs continued PT services  PT Problem List Decreased strength;Decreased mobility;Decreased activity tolerance;Decreased balance;Pain;Decreased knowledge of use of DME;Decreased range of motion       PT Treatment Interventions Therapeutic activities;Therapeutic exercise;Gait training;Stair training;Functional mobility training;Patient/family education;DME instruction    PT Goals (Current goals can be found in the Care Plan section)  Acute Rehab PT Goals Patient Stated Goal: to feel better, have less pain PT Goal Formulation: With patient/family Time For Goal Achievement: 10/02/20 Potential to Achieve Goals: Good    Frequency Min 2X/week   Barriers to discharge Inaccessible home environment      Co-evaluation PT/OT/SLP Co-Evaluation/Treatment: Yes Reason for Co-Treatment: For patient/therapist safety;To address functional/ADL transfers PT goals addressed during session: Mobility/safety with mobility;Balance;Proper use of DME         AM-PAC PT "6 Clicks" Mobility  Outcome Measure Help needed turning from your back to your side while in a flat bed without using bedrails?: A Lot Help needed moving from lying on your back to sitting on the side of a flat bed without using bedrails?: A Lot Help needed moving to and from a bed to a chair (including a wheelchair)?: A Lot Help needed standing up from a chair using your arms (e.g., wheelchair or bedside chair)?: A Lot Help needed to walk in hospital room?: A Lot Help needed climbing 3-5 steps with a railing? : Total 6 Click Score: 11    End of Session Equipment Utilized During Treatment: Gait belt Activity Tolerance: Patient limited by pain Patient left: in chair;with family/visitor present Nurse Communication: Mobility status PT Visit Diagnosis: Other abnormalities of gait and mobility (R26.89);Pain;Muscle weakness (generalized)  (M62.81);Difficulty in walking, not elsewhere classified (R26.2) Pain - Right/Left: Right Pain - part of body: Leg    Time: 6759-1638 PT Time Calculation (min) (ACUTE ONLY): 17 min   Charges:   PT Evaluation $PT Eval Moderate Complexity: 1 Mod          Glover Capano, PT, GCS 09/18/20,2:31 PM

## 2020-09-18 NOTE — ED Notes (Signed)
Pharmacy to send up medications 

## 2020-09-18 NOTE — ED Triage Notes (Signed)
Emergency Medicine Provider Triage Evaluation Note  Brittney Ramirez , a 80 y.o. female  was evaluated in triage.  Pt complains of nontraumatic right groin pain. Cleaning and organizing her house yesterday. Pain began last night. Feels like cramps when drawing her leg up. Exacerbated by movement. Relieved by rest. History of same with unremarkable work-up.  Review of Systems  Positive: Right groin/hip pain Negative: Fever, vomiting, dysuria  Physical Exam  BP (!) 121/42 (BP Location: Left Arm)   Pulse 97   Temp 98.2 F (36.8 C) (Oral)   Resp 18   Ht 5\' 6"  (1.676 m)   Wt 81.6 kg   SpO2 98%   BMI 29.05 kg/m  Gen:   Awake, no distress   HEENT:  Atraumatic  Resp:  Normal effort  Cardiac:  Normal rate  Abd:   Nondistended, nontender  MSK:   No pain at rest; right hip and groin tender to palpation. No palpable masses. Painful to range.  Neuro:  Speech clear   Medical Decision Making  Medically screening exam initiated at 1:11 AM.  Appropriate orders placed.  Brittney Ramirez was informed that the remainder of the evaluation will be completed by another provider, this initial triage assessment does not replace that evaluation, and the importance of remaining in the ED until their evaluation is complete.  Clinical Impression  80 year old female presenting with nontraumatic right groin/hip pain. Ddx includes renal colic, hernia, groin strain, abscess, msk, etc.  Will obtain basic labwork, start with xray imaging.   Paulette Blanch, MD 09/18/20 [phone]

## 2020-09-18 NOTE — ED Provider Notes (Addendum)
[facility] Emergency Department Provider Note   ____________________________________________   Event Date/Time   First MD Initiated Contact with Patient 09/18/20 [phone]     (approximate)  I have reviewed the triage vital signs and the nursing notes.   HISTORY  Chief Complaint Groin Pain    HPI Brittney Ramirez is a 80 y.o. female brought to the ED from home with a chief complaint of nontraumatic right groin/hip pain. Patient has a history of atrial fibrillation on Xarelto, CKD, hypertension, hyperlipidemia who reports cleaning and organizing her house all day yesterday. Pain began last night to her right hip/groin area. Denies pain at rest but reports pain with movement and palpation. Feels like cramps when she draws her leg up. Mentions she has had a history of similar with negative work-up. Denies fever, chills, cough, chest pain, shortness of breath, abdominal pain, nausea, vomiting or dysuria.     Past Medical History:  Diagnosis Date  . Arm fracture   . Atrial fibrillation ([place])   . Chronic kidney disease   . Elevated glucose   . Hyperlipidemia   . Hypertension   . Jaw fracture ([place])   . Miscarriage   . Osteoporosis   . Sleep apnea   . Thyroid cyst     Patient Active Problem List   Diagnosis Date Noted  . Cerumen debris on tympanic membrane, right 06/17/2018  . UTI (urinary tract infection) 06/17/2018  . Multiple rib fractures 01/27/2018  . Advanced care planning/counseling discussion 06/11/2017  . Thoracic aortic aneurysm without rupture ([place]) 12/09/2016  . Aortic calcification ([place]) 12/09/2016  . SOBOE (shortness of breath on exertion) 11/03/2016  . Gastroesophageal reflux disease without esophagitis 05/14/2016  . CKD (chronic kidney disease) stage 3, GFR 30-59 ml/min (HCC) 05/14/2016  . Atrial fibrillation ([place]) 12/05/2015  . Hypothyroidism 12/05/2015  . Hyperlipidemia 12/05/2015  . Hypertensive heart and renal disease with congestive  heart failure and renal failure ([place]) 06/07/2015  . CHF (congestive heart failure), NYHA class II, chronic, systolic ([place]) [id number]  . Pulmonary nodules 05/07/2015  . Chronic systolic CHF (congestive heart failure), NYHA class 2 ([place]) 10/11/2014  . Benign essential HTN 04/06/2014  . Chronic a-fib ([place]) 04/06/2014    Past Surgical History:  Procedure Laterality Date  . CATARACT EXTRACTION Right   . CHOLECYSTECTOMY  2014  . JOINT REPLACEMENT Right 2003    Prior to Admission medications   Medication Sig Start Date End Date Taking? Authorizing Provider  HYDROcodone-acetaminophen (NORCO) 5-325 MG tablet Take 1 tablet by mouth every 6 (six) hours as needed for moderate pain. 09/18/20  Yes Paulette Blanch, MD  methocarbamol (ROBAXIN) 500 MG tablet Take 1 tablet (500 mg total) by mouth every 6 (six) hours as needed for muscle spasms. 09/18/20  Yes Paulette Blanch, MD  ondansetron (ZOFRAN ODT) 4 MG disintegrating tablet Take 1 tablet (4 mg total) by mouth every 8 (eight) hours as needed for nausea or vomiting. 09/18/20  Yes Paulette Blanch, MD  atorvastatin (LIPITOR) 40 MG tablet Take 1 tablet (40 mg total) by mouth at bedtime. 07/29/19   Volney American, PA-C  latanoprost (XALATAN) 0.005 % ophthalmic solution  08/20/14   [provider]  levothyroxine (SYNTHROID) 88 MCG tablet Take 1 tablet (88 mcg total) by mouth daily before breakfast. 07/29/19   Volney American, PA-C  metoprolol tartrate (LOPRESSOR) 100 MG tablet Take 100 mg by mouth daily.    [provider]  omeprazole (PRILOSEC) 20 MG capsule  Take 1 capsule (20 mg total) by mouth daily. 01/11/19   Guadalupe Maple, MD  rivaroxaban (XARELTO) 20 MG TABS tablet TAKE 1 TABLET (20 MG TOTAL) BY MOUTH ONCE DAILY. 01/05/18   [provider]  valsartan-hydrochlorothiazide (DIOVAN-HCT) 160-25 MG tablet Take 1 tablet by mouth daily. 07/29/19   Volney American, PA-C    Allergies Morphine and related, Alendronate,  Buprenorphine hcl, Codeine, and Desipramine  Family History  Problem Relation Age of Onset  . Heart disease Mother   . Hypertension Mother   . Heart disease Father   . Hypertension Father   . Arthritis Sister   . Cancer Sister   . Diabetes Sister   . Heart disease Sister   . Breast cancer Sister 76  . Diabetes Brother   . Heart disease Brother   . Hypertension Brother     Social History Social History   Tobacco Use  . Smoking status: Former Smoker    Start date: 02/08/1955    Quit date: 02/08/1955    Years since quitting: 65.6  . Smokeless tobacco: Never Used  Vaping Use  . Vaping Use: Never used  Substance Use Topics  . Alcohol use: No    Alcohol/week: 0.0 standard drinks  . Drug use: No    Review of Systems  Constitutional: No fever/chills Eyes: No visual changes. ENT: No sore throat. Cardiovascular: Denies chest pain. Respiratory: Denies shortness of breath. Gastrointestinal: No abdominal pain.  No nausea, no vomiting.  No diarrhea.  No constipation. Genitourinary: Negative for dysuria. Musculoskeletal: Positive for right hip/groin pain. Negative for back pain. Skin: Negative for rash. Neurological: Negative for headaches, focal weakness or numbness.   ____________________________________________   PHYSICAL EXAM:  VITAL SIGNS: ED Triage Vitals  Enc Vitals Group     BP 09/18/20 0057 (!) 121/42     Pulse Rate 09/18/20 0057 97     Resp 09/18/20 0057 18     Temp 09/18/20 0057 98.2 F (36.8 C)     Temp Source 09/18/20 0057 Oral     SpO2 09/18/20 0057 98 %     Weight 09/18/20 0059 180 lb (81.6 kg)     Height 09/18/20 0059 5\' 6"  (1.676 m)     Head Circumference --      Peak Flow --      Pain Score 09/18/20 0058 10     Pain Loc --      Pain Edu? --      Excl. in [place]? --     Constitutional: Alert and oriented. Elderly appearing and in no acute distress. Eyes: Conjunctivae are normal. PERRL. EOMI. Head: Atraumatic. Nose: No  congestion/rhinnorhea. Mouth/Throat: Mucous membranes are moist.   Neck: No stridor.   Cardiovascular: Normal rate, regular rhythm. Grossly normal heart sounds.  Good peripheral circulation. Respiratory: Normal respiratory effort.  No retractions. Lungs CTAB. Gastrointestinal: Soft and nontender to light or deep palpation. No distention. No abdominal bruits. No CVA tenderness. Musculoskeletal: Right groin and lateral hip tender to palpation. Full range of motion with pain. No palpable masses. 2+ femoral and distal pulses. No pedal edema.  No joint effusions. Neurologic:  Normal speech and language. No gross focal neurologic deficits are appreciated.  Skin:  Skin is warm, dry and intact. No rash noted. Psychiatric: Mood and affect are normal. Speech and behavior are normal.  ____________________________________________   LABS (all labs ordered are listed, but only abnormal results are displayed)  Labs Reviewed  CBC WITH DIFFERENTIAL/PLATELET - Abnormal; Notable for  the following components:      Result Value   Hemoglobin 11.4 (*)    HCT 35.9 (*)    RDW 17.2 (*)    Platelets 149 (*)    Eosinophils Absolute 0.7 (*)    All other components within normal limits  COMPREHENSIVE METABOLIC PANEL - Abnormal; Notable for the following components:   Glucose, Bld 122 (*)    Creatinine, Ser 1.23 (*)    GFR, Estimated 44 (*)    All other components within normal limits  LACTIC ACID, PLASMA  CK  URINALYSIS, COMPLETE (UACMP) WITH MICROSCOPIC   ____________________________________________  EKG  None ____________________________________________  RADIOLOGY I, Jessyka Austria J, personally viewed and evaluated these images (plain radiographs) as part of my medical decision making, as well as reviewing the written report by the radiologist.  ED MD interpretation:  Unremarkable hip xray; soft tissue lesion seen on CT scan which may represent hematoma, bursitis, soft tissue lesion  Official radiology  report(s): CT Abdomen Pelvis Wo Contrast  Result Date: 09/18/2020 CLINICAL DATA:  Abdominal pain nonlocalized. EXAM: CT ABDOMEN AND PELVIS WITHOUT CONTRAST TECHNIQUE: Multidetector CT imaging of the abdomen and pelvis was performed following the standard protocol without IV contrast. COMPARISON:  CT chest 11/28/2016, CT abdomen pelvis 04/02/2015. FINDINGS: Lower chest: Bibasilar subsegmental atelectasis. Right lower lobe linear scarring versus atelectasis.Aortic root calcifications. Coronary artery calcifications. Hepatobiliary: No focal liver abnormality. Status post cholecystectomy. No biliary dilatation. Pancreas: No focal lesion. Normal pancreatic contour. No surrounding inflammatory changes. No main pancreatic ductal dilatation. Spleen: Normal in size without focal abnormality. Adrenals/Urinary Tract: No adrenal nodule bilaterally. Similar-appearing 1.6 cm coarsely calcified exophytic lesion arising from the right kidney versus associated adjacent fat necrosis. Subcentimeter hypodensities are too small to characterize. No nephrolithiasis, no hydronephrosis, and no contour-deforming renal mass. No ureterolithiasis or hydroureter. The urinary bladder is unremarkable. Stomach/Bowel: Stomach is within normal limits. No evidence of bowel wall thickening or dilatation. Diffuse sigmoid diverticulosis. Scattered colonic diverticulosis of the remaining large bowel. Fatty infiltration of the ascending colon wall suggestive of chronic inflammatory changes. The appendix not definitely identified. Vascular/Lymphatic: No abdominal aorta or iliac aneurysm. Moderate atherosclerotic plaque of the aorta and its branches. No abdominal, pelvic, or inguinal lymphadenopathy. Reproductive: Uterus and bilateral adnexa are unremarkable. Other: No intraperitoneal free fluid. No intraperitoneal free gas. No organized fluid collection. Musculoskeletal: Partially visualized heterogeneous lobulated soft tissue lesion along the right  greater trochanter (2:83). This lesion measures at least up to 7 cm. No suspicious lytic or blastic osseous lesions. No acute displaced fracture. Old healed left superior inferior pubic rami fractures. Multilevel severe degenerative changes of the spine with intervertebral disc space vacuum phenomenon and endplate sclerosis. IMPRESSION: 1. Interval development of a partially visualized at least 7cm heterogeneous lobulated soft tissue lesion along the right greater trochanter. Finding could represent bursitis versus hematoma versus mass lesion formation. 2. Colonic diverticulosis with no acute diverticulitis. Electronically Signed   By: Iven Finn M.D.   On: 09/18/2020 04:17   DG Hip Unilat With Pelvis 2-3 Views Right  Result Date: 09/18/2020 CLINICAL DATA:  Right groin pain EXAM: DG HIP (WITH OR WITHOUT PELVIS) 3V RIGHT COMPARISON:  02/08/2015 FINDINGS: Pelvic ring again demonstrates old healed left pubic rami fracture stable from the prior exam. No acute fracture is seen. Degenerative changes of the hip joints and lumbar spine are seen. No acute fracture or dislocation is noted. No soft tissue abnormality is seen. IMPRESSION: Chronic changes without acute abnormality. Electronically Signed   By: Elta Guadeloupe  Lukens M.D.   On: 09/18/2020 01:51    ____________________________________________   PROCEDURES  Procedure(s) performed (including Critical Care):  Procedures   ____________________________________________   INITIAL IMPRESSION / ASSESSMENT AND PLAN / ED COURSE  As part of my medical decision making, I reviewed the following data within the [place] notes reviewed and incorporated, Labs reviewed, Old chart reviewed, Radiograph reviewed, Notes from prior ED visits and [place] Controlled Substance Database     80 year old female presenting with right groin/hip pain, nontraumatic. Differential diagnosis includes but is not limited to pathological fracture, strain, hernia,  msk, etc.  Patient had labwork and imaging studies which demonstrate likely right bursitis vs hematoma vs mass lesion formation. She has walker easily accessible at home. Will medicate with analgesic and muscle relaxer then perform ambulation trial. Encouraged patient to hold Xarelto x 3 days but she prefers to check with her cardiologist prior to holding it. Will reassess.  Clinical Course as of 09/18/20 8469  Tue Sep 18, 2020  0653 Care transferred to Dr. Corky Downs at change of shift. Patient awaiting ambulation trial. If successful, anticipate discharge home with PRN Norco and Robaxin. If unsuccessful, would consider IV analgesia and admission for pain control.  [JS]    Clinical Course User Index [JS] Paulette Blanch, MD     ____________________________________________   FINAL CLINICAL IMPRESSION(S) / ED DIAGNOSES  Final diagnoses:  Trochanteric bursitis, right hip     ED Discharge Orders         Ordered    HYDROcodone-acetaminophen (NORCO) 5-325 MG tablet  Every 6 hours PRN        09/18/20 0627    ondansetron (ZOFRAN ODT) 4 MG disintegrating tablet  Every 8 hours PRN        09/18/20 0627    methocarbamol (ROBAXIN) 500 MG tablet  Every 6 hours PRN        09/18/20 6295          *Please note:  Brittney Ramirez was evaluated in Emergency Department on 09/18/2020 for the symptoms described in the history of present illness. She was evaluated in the context of the global COVID-19 pandemic, which necessitated consideration that the patient might be at risk for infection with the SARS-CoV-2 virus that causes COVID-19. Institutional protocols and algorithms that pertain to the evaluation of patients at risk for COVID-19 are in a state of rapid change based on information released by regulatory bodies including the CDC and federal and state organizations. These policies and algorithms were followed during the patient's care in the ED.  Some ED evaluations and interventions may be delayed as a  result of limited staffing during and the pandemic.*   Note:  This document was prepared using Dragon voice recognition software and may include unintentional dictation errors.   Paulette Blanch, MD 09/18/20 2841    Paulette Blanch, MD 09/18/20 [phone]

## 2020-09-18 NOTE — Evaluation (Signed)
Occupational Therapy Evaluation Patient Details Name: Brittney Ramirez MRN: [id number] DOB: [birth year] Today's Date: 09/18/2020    History of Present [name] , a 80 y.o. female  was evaluated in triage.  Pt complains of nontraumatic right groin pain. Cleaning and organizing her house yesterday. Pain began last night. Feels like cramps when drawing her leg up. Exacerbated by movement. Relieved by rest. History of same with unremarkable work-up.   Clinical Impression   Patient presenting with decreased I in self care, balance, functional mobility/transfer, endurance, and safety awareness. Pt with significant pain in R hip/groin area with mobility described as "sharp shooting pain". Patient reports living at home with husband and being independent with self care tasks PTA. Pt does use SPC for community mobility but does not own any additional equipment.  Patient currently functioning at +2 assistance to stand and take several steps with significant pain noted. Pain is barrier at this time. Pt has several steps to enter home and would need significant assistance with LB self care and toileting at this time. Therefore, OT recommends short term rehab stay before returning home to address functional deficits.  Patient will benefit from acute OT to increase overall independence in the areas of ADLs, functional mobility, and safety awareness in order to safely discharge to next venue of care.    Follow Up Recommendations  SNF    Equipment Recommendations  Other (comment) (defer to next venue of care)       Precautions / Restrictions Precautions Precautions: None Restrictions Weight Bearing Restrictions: No      Mobility Bed Mobility          General bed mobility comments: not assessed, patient received in wheelchair in hallway    Transfers Overall transfer level: Needs assistance Equipment used: Rolling walker (2 wheeled) Transfers: Sit to/from Stand Sit to Stand: Mod assist;+2  physical assistance         General transfer comment: cues for hand placement with transferring. Mod assist to get balance and fully upright.    Balance Overall balance assessment: Needs assistance Sitting-balance support: Feet supported Sitting balance-Leahy Scale: Good     Standing balance support: During functional activity;Bilateral upper extremity supported Standing balance-Leahy Scale: Poor                             ADL either performed or assessed with clinical judgement   ADL Overall ADL's : Needs assistance/impaired             Lower Body Bathing: Maximal assistance Lower Body Bathing Details (indicate cue type and reason): anticipate secondary to pain         Toilet Transfer: Moderate assistance;+2 for safety/equipment;RW Toilet Transfer Details (indicate cue type and reason): simulated                 Vision Baseline Vision/History: Wears glasses Wears Glasses: At all times Patient Visual Report: No change from baseline              Pertinent Vitals/Pain Pain Assessment: Faces Faces Pain Scale: Hurts little more Pain Location: R hip/groin Pain Descriptors / Indicators: Aching;Guarding;Grimacing;Sore;Shooting Pain Intervention(s): Limited activity within patient's tolerance;Monitored during session;Premedicated before session     Hand Dominance Right   Extremity/Trunk Assessment Upper Extremity Assessment Upper Extremity Assessment: Generalized weakness   Lower Extremity Assessment Lower Extremity Assessment: Defer to PT evaluation RLE Deficits / Details: pain in right hip. Difficulty lifting R LE, WBing  on R LE RLE: Unable to fully assess due to pain RLE Sensation: WNL RLE Coordination: decreased gross motor   Cervical / Trunk Assessment Cervical / Trunk Assessment: Normal   Communication Communication Communication: No difficulties   Cognition Arousal/Alertness: Awake/alert Behavior During Therapy: WFL for tasks  assessed/performed Overall Cognitive Status: Within Functional Limits for tasks assessed                                                Home Living Family/patient expects to be discharged to:: Private residence Living Arrangements: Spouse/significant other Available Help at Discharge: Available 24 hours/day;Family Type of Home: House Home Access: Stairs to enter [employer] of Steps: 3-4 Entrance Stairs-Rails: Right;Left;Can reach both Home Layout: One level     Bathroom Shower/Tub: Tub/shower unit         Home Equipment: Cane - single point;Walker - 2 wheels          Prior Functioning/Environment Level of Independence: Independent        Comments: uses cane for community mobility        OT Problem List: Decreased strength;Decreased activity tolerance;Impaired balance (sitting and/or standing);Decreased safety awareness;Pain;Decreased range of motion      OT Treatment/Interventions: Self-care/ADL training;Manual therapy;Therapeutic exercise;Energy conservation;DME and/or AE instruction;Therapeutic activities;Balance training;Patient/family education    OT Goals(Current goals can be found in the care plan section) Acute Rehab OT Goals Patient Stated Goal: to feel better, have less pain OT Goal Formulation: With patient/family Time For Goal Achievement: 10/02/20 Potential to Achieve Goals: Good ADL Goals Pt Will Perform Grooming: with min assist;standing Pt Will Perform Lower Body Dressing: with min assist;sit to/from stand Pt Will Transfer to Toilet: with min assist;ambulating Pt Will Perform Toileting - Clothing Manipulation and hygiene: with min assist;sit to/from stand  OT Frequency: Min 2X/week           Co-evaluation PT/OT/SLP Co-Evaluation/Treatment: Yes Reason for Co-Treatment: To address functional/ADL transfers;For patient/therapist safety PT goals addressed during session: Mobility/safety with mobility;Balance;Proper  use of DME OT goals addressed during session: ADL's and self-care;Proper use of Adaptive equipment and DME      AM-PAC OT "6 Clicks" Daily Activity     Outcome Measure Help from another person eating meals?: None Help from another person taking care of personal grooming?: A Little Help from another person toileting, which includes using toliet, bedpan, or urinal?: Total Help from another person bathing (including washing, rinsing, drying)?: A Lot Help from another person to put on and taking off regular upper body clothing?: A Little Help from another person to put on and taking off regular lower body clothing?: Total 6 Click Score: 14   End of Session Equipment Utilized During Treatment: Rolling walker  Activity Tolerance: Patient limited by pain Patient left: in bed;with bed alarm set  OT Visit Diagnosis: Unsteadiness on feet (R26.81);Muscle weakness (generalized) (M62.81);Pain Pain - Right/Left: Right Pain - part of body: Hip (groin)                Time: 1478-2956 OT Time Calculation (min): 26 min Charges:  OT General Charges $OT Visit: 1 Visit OT Evaluation $OT Eval Moderate Complexity: [address], [place], OTR/L , CBIS ascom [phone]  09/18/20, 3:42 PM  09/18/2020, 3:41 PM

## 2020-09-18 NOTE — ED Notes (Signed)
Pt being transported to floor at this time. Pt going to 141 by EDt MEl

## 2020-09-19 DIAGNOSIS — R531 Weakness: Secondary | ICD-10-CM | POA: Diagnosis present

## 2020-09-19 DIAGNOSIS — Z79899 Other long term (current) drug therapy: Secondary | ICD-10-CM | POA: Diagnosis not present

## 2020-09-19 DIAGNOSIS — Z888 Allergy status to other drugs, medicaments and biological substances status: Secondary | ICD-10-CM | POA: Diagnosis not present

## 2020-09-19 DIAGNOSIS — Z20822 Contact with and (suspected) exposure to covid-19: Secondary | ICD-10-CM | POA: Diagnosis present

## 2020-09-19 DIAGNOSIS — Z9049 Acquired absence of other specified parts of digestive tract: Secondary | ICD-10-CM | POA: Diagnosis not present

## 2020-09-19 DIAGNOSIS — I712 Thoracic aortic aneurysm, without rupture: Secondary | ICD-10-CM | POA: Diagnosis present

## 2020-09-19 DIAGNOSIS — N183 Chronic kidney disease, stage 3 unspecified: Secondary | ICD-10-CM | POA: Diagnosis present

## 2020-09-19 DIAGNOSIS — Z79890 Hormone replacement therapy (postmenopausal): Secondary | ICD-10-CM | POA: Diagnosis not present

## 2020-09-19 DIAGNOSIS — T45515A Adverse effect of anticoagulants, initial encounter: Secondary | ICD-10-CM | POA: Diagnosis present

## 2020-09-19 DIAGNOSIS — I5022 Chronic systolic (congestive) heart failure: Secondary | ICD-10-CM | POA: Diagnosis present

## 2020-09-19 DIAGNOSIS — E785 Hyperlipidemia, unspecified: Secondary | ICD-10-CM | POA: Diagnosis present

## 2020-09-19 DIAGNOSIS — I480 Paroxysmal atrial fibrillation: Secondary | ICD-10-CM | POA: Diagnosis present

## 2020-09-19 DIAGNOSIS — Z885 Allergy status to narcotic agent status: Secondary | ICD-10-CM | POA: Diagnosis not present

## 2020-09-19 DIAGNOSIS — R262 Difficulty in walking, not elsewhere classified: Secondary | ICD-10-CM | POA: Diagnosis present

## 2020-09-19 DIAGNOSIS — Z7901 Long term (current) use of anticoagulants: Secondary | ICD-10-CM | POA: Diagnosis not present

## 2020-09-19 DIAGNOSIS — I130 Hypertensive heart and chronic kidney disease with heart failure and stage 1 through stage 4 chronic kidney disease, or unspecified chronic kidney disease: Secondary | ICD-10-CM | POA: Diagnosis present

## 2020-09-19 DIAGNOSIS — E039 Hypothyroidism, unspecified: Secondary | ICD-10-CM | POA: Diagnosis present

## 2020-09-19 DIAGNOSIS — Z87891 Personal history of nicotine dependence: Secondary | ICD-10-CM | POA: Diagnosis not present

## 2020-09-19 DIAGNOSIS — M810 Age-related osteoporosis without current pathological fracture: Secondary | ICD-10-CM | POA: Diagnosis present

## 2020-09-19 DIAGNOSIS — Z8249 Family history of ischemic heart disease and other diseases of the circulatory system: Secondary | ICD-10-CM | POA: Diagnosis not present

## 2020-09-19 DIAGNOSIS — M7981 Nontraumatic hematoma of soft tissue: Secondary | ICD-10-CM | POA: Diagnosis present

## 2020-09-19 LAB — URINALYSIS, COMPLETE (UACMP) WITH MICROSCOPIC
Bacteria, UA: NONE SEEN
Bilirubin Urine: NEGATIVE
Glucose, UA: NEGATIVE mg/dL
Hgb urine dipstick: NEGATIVE
Ketones, ur: NEGATIVE mg/dL
Leukocytes,Ua: NEGATIVE
Nitrite: NEGATIVE
Protein, ur: NEGATIVE mg/dL
Specific Gravity, Urine: 1.01 (ref 1.005–1.030)
pH: 6 (ref 5.0–8.0)

## 2020-09-19 LAB — BASIC METABOLIC PANEL
Anion gap: 11 (ref 5–15)
BUN: 15 mg/dL (ref 8–23)
CO2: 26 mmol/L (ref 22–32)
Calcium: 9.4 mg/dL (ref 8.9–10.3)
Chloride: 103 mmol/L (ref 98–111)
Creatinine, Ser: 1.05 mg/dL — ABNORMAL HIGH (ref 0.44–1.00)
GFR, Estimated: 53 mL/min — ABNORMAL LOW (ref >60)
Glucose, Bld: 107 mg/dL — ABNORMAL HIGH (ref 70–99)
Potassium: 3.9 mmol/L (ref 3.5–5.1)
Sodium: 140 mmol/L (ref 135–145)

## 2020-09-19 LAB — CBC
HCT: 34.9 % — ABNORMAL LOW (ref 36.0–46.0)
Hemoglobin: 11.1 g/dL — ABNORMAL LOW (ref 12.0–15.0)
MCH: 29.5 pg (ref 26.0–34.0)
MCHC: 31.8 g/dL (ref 30.0–36.0)
MCV: 92.8 fL (ref 80.0–100.0)
Platelets: 154 10*3/uL (ref 150–400)
RBC: 3.76 MIL/uL — ABNORMAL LOW (ref 3.87–5.11)
RDW: 17.3 % — ABNORMAL HIGH (ref 11.5–15.5)
WBC: 7.2 10*3/uL (ref 4.0–10.5)
nRBC: 0 % (ref 0.0–0.2)

## 2020-09-19 MED ORDER — DONEPEZIL HCL 5 MG PO TABS
5.0000 mg | ORAL_TABLET | Freq: Every day | ORAL | Status: DC
  Administered 2020-09-19: 5 mg via ORAL
  Filled 2020-09-19: qty 1

## 2020-09-19 NOTE — NC FL2 (Signed)
[place] LEVEL OF CARE SCREENING TOOL     IDENTIFICATION  Patient Name: Brittney Ramirez Birthdate: Aug 30, 1940 Sex: female Admission Date (Current Location): 09/18/2020  [place] and [place] Number:  Engineering geologist and Address:  [facility], [address] [place], [place] [address]      Provider Number: [id number]  Attending Physician Name and Address:  Enzo Bi, MD  Relative Name and Phone Number:  Alissia Lory [phone]    Current Level of Care: Hospital Recommended Level of Care: [place] Prior Approval Number:    Date Approved/Denied:   PASRR Number: [id number]  Discharge Plan: SNF    Current Diagnoses: Patient Active Problem List   Diagnosis Date Noted  . Weakness 09/18/2020  . Cerumen debris on tympanic membrane, right 06/17/2018  . UTI (urinary tract infection) 06/17/2018  . Multiple rib fractures 01/27/2018  . Advanced care planning/counseling discussion 06/11/2017  . Thoracic aortic aneurysm without rupture ([place]) 12/09/2016  . Aortic calcification ([place]) 12/09/2016  . SOBOE (shortness of breath on exertion) 11/03/2016  . Gastroesophageal reflux disease without esophagitis 05/14/2016  . CKD (chronic kidney disease) stage 3, GFR 30-59 ml/min (HCC) 05/14/2016  . Atrial fibrillation ([place]) 12/05/2015  . Hypothyroidism 12/05/2015  . Hyperlipidemia 12/05/2015  . Hypertensive heart and renal disease with congestive heart failure and renal failure ([place]) 06/07/2015  . CHF (congestive heart failure), NYHA class II, chronic, systolic ([place]) 47/42/5956  . Pulmonary nodules 05/07/2015  . Chronic systolic CHF (congestive heart failure), NYHA class 2 ([place]) 10/11/2014  . Benign essential HTN 04/06/2014  . Chronic a-fib ([place]) 04/06/2014    Orientation RESPIRATION BLADDER Height & Weight     Self,Time,Situation,Place  Normal External catheter Weight: 81.6 kg Height:  5\' 6"  (167.6 cm)  BEHAVIORAL  SYMPTOMS/MOOD NEUROLOGICAL BOWEL NUTRITION STATUS      Incontinent (incontinence at times) Diet (Heart Healthy)  AMBULATORY STATUS COMMUNICATION OF NEEDS Skin   Extensive Assist Verbally Normal                       Personal Care Assistance Level of Assistance  Bathing,Feeding,Dressing Bathing Assistance: Limited assistance Feeding assistance: Independent Dressing Assistance: Limited assistance     Functional Limitations Info             SPECIAL CARE FACTORS FREQUENCY  PT (By licensed PT),OT (By licensed OT)                    Contractures Contractures Info: Not present    Additional Factors Info  Code Status,Allergies Code Status Info: Full Allergies Info: Morphine, Alendronate, Buprenorphine, Codeine, Desipramine           Current Medications (09/19/2020):  This is the current hospital active medication list Current Facility-Administered Medications  Medication Dose Route Frequency Provider Last Rate Last Admin  . acetaminophen (TYLENOL) tablet 325 mg  325 mg Oral Q6H PRN Cox, Amy N, DO       Or  . acetaminophen (TYLENOL) suppository 325 mg  325 mg Rectal Q6H PRN Cox, Amy N, DO      . atorvastatin (LIPITOR) tablet 40 mg  40 mg Oral QHS Cox, Amy N, DO   40 mg at 09/18/20 2143  . levothyroxine (SYNTHROID) tablet 88 mcg  88 mcg Oral QAC breakfast Cox, Amy N, DO      . metoprolol tartrate (LOPRESSOR) injection 5 mg  5 mg Intravenous Q4H PRN Cox, Amy N, DO      .  metoprolol tartrate (LOPRESSOR) tablet 100 mg  100 mg Oral Daily Cox, Amy N, DO   100 mg at 09/19/20 0925  . ondansetron (ZOFRAN) tablet 4 mg  4 mg Oral Q6H PRN Cox, Amy N, DO       Or  . ondansetron (ZOFRAN) injection 4 mg  4 mg Intravenous Q6H PRN Cox, Amy N, DO      . Rivaroxaban (XARELTO) tablet 15 mg  15 mg Oral Q supper Cox, Amy N, DO   15 mg at 09/18/20 1632     Discharge Medications: Please see discharge summary for a list of discharge medications.  Relevant Imaging Results:  Relevant  Lab Results:   Additional Information SS# [id number]  Shelbie Ammons, RN

## 2020-09-19 NOTE — Progress Notes (Signed)
PROGRESS NOTE    Brittney Ramirez  LKG:[id number] DOB: Feb 19, 1941 DOA: 09/18/2020 PCP: Adin Hector, MD    Assessment & Plan:   Principal Problem:   Weakness Active Problems:   CHF (congestive heart failure), NYHA class II, chronic, systolic (HCC)   Atrial fibrillation (HCC)   Hypothyroidism   Hyperlipidemia   Benign essential HTN   Inability to ambulate due to right hip   Brittney Ramirez is a 80 y.o. female with medical history significant for atrial fibrillation on Xarelto, hyperlipidemia, hypertension, hypothyroid, presented to the emergency department for right hip pain.  She denies falling or trauma.  She states the pain started on 09/17/2020.  She states that she was cleaning her house and going in and out of cabinets when she noticed the pain.   Right hip pain due to hematoma --CT of the abdomen and pelvis without contrast was completed and showed partially visualized 7 cm heterogeneous lobulated soft tissue lesion along the right greater trochanter. --Discussed with radiologist oncall today, who believed the mass most likely a hematoma in someone who takes Xarelto. Plan: --re-imaging in 6 weeks to check for resolution of the mass  Inability to ambulate due to right hip pain Generalized weakness -no focal neuro deficit --PT/OT rec SNF, however, pt and husband decided to go home with [facility].  Paroxysmal Atrial fibrillation -cont home metop and Xarelto  Hypertension --cont home metop  Hyperlipidemia -cont home statin   DVT prophylaxis: [id number]  Code Status: Full code  Family Communication: husband updated at the bedside today Status is: inpatient Dispo:   The patient is from: home Anticipated d/c is to: home with [facility] Anticipated d/c date is: tomorrow Patient currently is medically ready to d/c.   Subjective and Interval History:  Pt reported her right hip pain improved today.  A nodule under her right groin went away.  No abdominal pain.  No  N/V/D.   Objective: Vitals:   09/19/20 0900 09/19/20 0925 09/19/20 1101 09/19/20 1618  BP: 118/73 118/73 129/78 136/87  Pulse:  (!) 101 89 100  Resp:   18 16  Temp:   98.5 F (36.9 C) (!) 97.3 F (36.3 C)  TempSrc:   Oral Oral  SpO2:   97%   Weight:      Height:        Intake/Output Summary (Last 24 hours) at 09/19/2020 1644 Last data filed at 09/19/2020 1347 Gross per 24 hour  Intake 600 ml  Output 0 ml  Net 600 ml   Filed Weights   09/18/20 0059  Weight: 81.6 kg    Examination:   Constitutional: NAD, AAOx3 HEENT: conjunctivae and lids normal, EOMI CV: No cyanosis.   RESP: normal respiratory effort, on RA Extremities: No effusions, edema in BLE.  A mass was felt under anterior right hip. SKIN: warm, dry Neuro: II - XII grossly intact.   Psych: Normal mood and affect.  Appropriate judgement and reason   Data Reviewed: I have personally reviewed following labs and imaging studies  CBC: Recent Labs  Lab 09/18/20 0113 09/19/20 0508  WBC 9.2 7.2  NEUTROABS 5.4  --   HGB 11.4* 11.1*  HCT 35.9* 34.9*  MCV 92.5 92.8  PLT 149* 259   Basic Metabolic Panel: Recent Labs  Lab 09/18/20 0113 09/19/20 0508  NA 140 140  K 4.0 3.9  CL 103 103  CO2 22 26  GLUCOSE 122* 107*  BUN 20 15  CREATININE 1.23* 1.05*  CALCIUM 9.7  9.4   GFR: Estimated Creatinine Clearance: 45.2 mL/min (A) (by C-G formula based on SCr of 1.05 mg/dL (H)). Liver Function Tests: Recent Labs  Lab 09/18/20 0113  AST 21  ALT 12  ALKPHOS 54  BILITOT 0.5  PROT 7.0  ALBUMIN 4.2   No results for input(s): LIPASE, AMYLASE in the last 168 hours. No results for input(s): AMMONIA in the last 168 hours. Coagulation Profile: No results for input(s): INR, PROTIME in the last 168 hours. Cardiac Enzymes: Recent Labs  Lab 09/18/20 0113  CKTOTAL 59   BNP (last 3 results) No results for input(s): PROBNP in the last 8760 hours. HbA1C: No results for input(s): HGBA1C in the last 72  hours. CBG: No results for input(s): GLUCAP in the last 168 hours. Lipid Profile: No results for input(s): CHOL, HDL, LDLCALC, TRIG, CHOLHDL, LDLDIRECT in the last 72 hours. Thyroid Function Tests: No results for input(s): TSH, T4TOTAL, FREET4, T3FREE, THYROIDAB in the last 72 hours. Anemia Panel: No results for input(s): VITAMINB12, FOLATE, FERRITIN, TIBC, IRON, RETICCTPCT in the last 72 hours. Sepsis Labs: Recent Labs  Lab 09/18/20 0113  LATICACIDVEN 1.7    Recent Results (from the past 240 hour(s))  SARS CORONAVIRUS 2 (TAT 6-24 HRS) Nasopharyngeal Nasopharyngeal Swab     Status: None   Collection Time: 09/18/20 12:12 PM   Specimen: Nasopharyngeal Swab  Result Value Ref Range Status   SARS Coronavirus 2 NEGATIVE NEGATIVE Final    Comment: (NOTE) SARS-CoV-2 target nucleic acids are NOT DETECTED.  The SARS-CoV-2 RNA is generally detectable in upper and lower respiratory specimens during the acute phase of infection. Negative results do not preclude SARS-CoV-2 infection, do not rule out co-infections with other pathogens, and should not be used as the sole basis for treatment or other patient management decisions. Negative results must be combined with clinical observations, patient history, and epidemiological information. The expected result is Negative.  Fact Sheet for Patients: SugarRoll.be  Fact Sheet for Healthcare Providers: https://www.woods-mathews.com/  This test is not yet approved or cleared by the [place] FDA and  has been authorized for detection and/or diagnosis of SARS-CoV-2 by FDA under an Emergency Use Authorization (EUA). This EUA will remain  in effect (meaning this test can be used) for the duration of the COVID-19 declaration under Se ction 564(b)(1) of the Act, 21 U.S.C. section 360bbb-3(b)(1), unless the authorization is terminated or revoked sooner.  Performed at [facility], [place] [address], [place], [place] [address]       Radiology Studies: CT Abdomen Pelvis Wo Contrast  Result Date: 09/18/2020 CLINICAL DATA:  Abdominal pain nonlocalized. EXAM: CT ABDOMEN AND PELVIS WITHOUT CONTRAST TECHNIQUE: Multidetector CT imaging of the abdomen and pelvis was performed following the standard protocol without IV contrast. COMPARISON:  CT chest 11/28/2016, CT abdomen pelvis 04/02/2015. FINDINGS: Lower chest: Bibasilar subsegmental atelectasis. Right lower lobe linear scarring versus atelectasis.Aortic root calcifications. Coronary artery calcifications. Hepatobiliary: No focal liver abnormality. Status post cholecystectomy. No biliary dilatation. Pancreas: No focal lesion. Normal pancreatic contour. No surrounding inflammatory changes. No main pancreatic ductal dilatation. Spleen: Normal in size without focal abnormality. Adrenals/Urinary Tract: No adrenal nodule bilaterally. Similar-appearing 1.6 cm coarsely calcified exophytic lesion arising from the right kidney versus associated adjacent fat necrosis. Subcentimeter hypodensities are too small to characterize. No nephrolithiasis, no hydronephrosis, and no contour-deforming renal mass. No ureterolithiasis or hydroureter. The urinary bladder is unremarkable. Stomach/Bowel: Stomach is within normal limits. No evidence of bowel wall thickening or dilatation. Diffuse sigmoid  diverticulosis. Scattered colonic diverticulosis of the remaining large bowel. Fatty infiltration of the ascending colon wall suggestive of chronic inflammatory changes. The appendix not definitely identified. Vascular/Lymphatic: No abdominal aorta or iliac aneurysm. Moderate atherosclerotic plaque of the aorta and its branches. No abdominal, pelvic, or inguinal lymphadenopathy. Reproductive: Uterus and bilateral adnexa are unremarkable. Other: No intraperitoneal free fluid. No intraperitoneal free gas. No organized fluid collection. Musculoskeletal: Partially visualized heterogeneous  lobulated soft tissue lesion along the right greater trochanter (2:83). This lesion measures at least up to 7 cm. No suspicious lytic or blastic osseous lesions. No acute displaced fracture. Old healed left superior inferior pubic rami fractures. Multilevel severe degenerative changes of the spine with intervertebral disc space vacuum phenomenon and endplate sclerosis. IMPRESSION: 1. Interval development of a partially visualized at least 7cm heterogeneous lobulated soft tissue lesion along the right greater trochanter. Finding could represent bursitis versus hematoma versus mass lesion formation. 2. Colonic diverticulosis with no acute diverticulitis. Electronically Signed   By: Iven Finn M.D.   On: 09/18/2020 04:17   DG Hip Unilat With Pelvis 2-3 Views Right  Result Date: 09/18/2020 CLINICAL DATA:  Right groin pain EXAM: DG HIP (WITH OR WITHOUT PELVIS) 3V RIGHT COMPARISON:  02/08/2015 FINDINGS: Pelvic ring again demonstrates old healed left pubic rami fracture stable from the prior exam. No acute fracture is seen. Degenerative changes of the hip joints and lumbar spine are seen. No acute fracture or dislocation is noted. No soft tissue abnormality is seen. IMPRESSION: Chronic changes without acute abnormality. Electronically Signed   By: Inez Catalina M.D.   On: 09/18/2020 01:51     Scheduled Meds: . atorvastatin  40 mg Oral QHS  . levothyroxine  88 mcg Oral QAC breakfast  . metoprolol tartrate  100 mg Oral Daily  . rivaroxaban  15 mg Oral Q supper   Continuous Infusions:   LOS: 0 days     Enzo Bi, MD [facility] If 7PM-7AM, please contact night-coverage 09/19/2020, 4:44 PM

## 2020-09-19 NOTE — Progress Notes (Signed)
Physical Therapy Treatment Patient Details Name: Brittney Ramirez MRN: [id number] DOB: 09/17/1940 Today's Date: 09/19/2020    History of Present Illness Brittney Ramirez , a 80 y.o. female  was evaluated in triage.  Pt complains of nontraumatic right groin pain. Cleaning and organizing her house yesterday. Pain began last night. Feels like cramps when drawing her leg up. Exacerbated by movement. Relieved by rest. History of same with unremarkable work-up.    PT Comments    Patient alert, agreeable, family at bedside. Pt reported 0/10 pain at rest, that increased to 7/10 with mobility, pt declined pain medication and RN notified of patient status. Overall the patient demonstrated great progress towards goals. MinA for RLE assist to mobilize to EOB for pain management. Progressed from [place] for sit to stands with RW. She was able to ambulate ~76ft with RW and CGA. Initially step to gait pattern, but progressed to step through with education and time. Pt also performed stair navigation with CGA and bilateral rails. 1 small LOB noted but able to correct with rails and minA. Pt returned to room, up in chair all needs in reach. Due to pt progress and improvement in functional mobility, discharge recommendations updated to HHPT with supervision/assistance 24/7 including for mobility/OOB. Family agreeable, CM updated.     Follow Up Recommendations  Home health PT;Supervision/Assistance - 24 hour;Supervision for mobility/OOB     Equipment Recommendations  Rolling walker with 5" wheels    Recommendations for Other Services       Precautions / Restrictions Precautions Precautions: None Restrictions Weight Bearing Restrictions: No    Mobility  Bed Mobility Overal bed mobility: Needs Assistance Bed Mobility: Supine to Sit     Supine to sit: Min assist     General bed mobility comments: light minA for RLE for pain management  Transfers Overall transfer level: Needs assistance Equipment  used: Rolling walker (2 wheeled) Transfers: Sit to/from Stand Sit to Stand: Mod assist;Min assist;Min guard         General transfer comment: cues for hand placement, good carry over by third attempt. Progressed to CGA from recliner  Ambulation/Gait Ambulation/Gait assistance: Min guard Gait Distance (Feet): 60 Feet (seated rest break for stair training after 61ft) Assistive device: Rolling walker (2 wheeled) Gait Pattern/deviations: Step-to pattern;Decreased step length - right;Decreased weight shift to right Gait velocity: decr   General Gait Details: cued for step to gait pattern, was able to progress to near step through pattern by end of ambulation   Stairs Stairs: Yes Stairs assistance: Min guard Stair Management: Two rails;Step to pattern Number of Stairs: 4 General stair comments: 1 small LOB able to correct with rails, remainder of training safe, steady   Wheelchair Mobility    Modified Rankin (Stroke Patients Only)       Balance Overall balance assessment: Needs assistance Sitting-balance support: Feet supported Sitting balance-Leahy Scale: Good     Standing balance support: During functional activity;Bilateral upper extremity supported Standing balance-Leahy Scale: Fair                              Cognition Arousal/Alertness: Awake/alert Behavior During Therapy: WFL for tasks assessed/performed Overall Cognitive Status: Within Functional Limits for tasks assessed  Exercises      General Comments        Pertinent Vitals/Pain Pain Assessment: 0-10 Pain Score: 7  Pain Location: R hip/groin Pain Descriptors / Indicators: Guarding;Aching;Sore Pain Intervention(s): Limited activity within patient's tolerance;Monitored during session;Premedicated before session    Home Living                      Prior Function            PT Goals (current goals can now be found in  the care plan section) Progress towards PT goals: Progressing toward goals    Frequency    Min 2X/week      PT Plan Discharge plan needs to be updated    Co-evaluation              AM-PAC PT "6 Clicks" Mobility   Outcome Measure  Help needed turning from your back to your side while in a flat bed without using bedrails?: None Help needed moving from lying on your back to sitting on the side of a flat bed without using bedrails?: A Little Help needed moving to and from a bed to a chair (including a wheelchair)?: A Little Help needed standing up from a chair using your arms (e.g., wheelchair or bedside chair)?: A Little Help needed to walk in hospital room?: A Little Help needed climbing 3-5 steps with a railing? : A Little 6 Click Score: 19    End of Session Equipment Utilized During Treatment: Gait belt Activity Tolerance: Patient limited by pain Patient left: in chair;with chair alarm set;with call bell/phone within reach;with family/visitor present Nurse Communication: Mobility status PT Visit Diagnosis: Other abnormalities of gait and mobility (R26.89);Pain;Muscle weakness (generalized) (M62.81);Difficulty in walking, not elsewhere classified (R26.2) Pain - Right/Left: Right Pain - part of body: Leg     Time: 1353-1435 PT Time Calculation (min) (ACUTE ONLY): 42 min  Charges:  $Gait Training: 23-37 mins $Therapeutic Exercise: 8-22 mins                     Lieutenant Diego PT, DPT 3:10 PM,09/19/20

## 2020-09-20 LAB — CBC
HCT: 34.8 % — ABNORMAL LOW (ref 36.0–46.0)
Hemoglobin: 11.2 g/dL — ABNORMAL LOW (ref 12.0–15.0)
MCH: 29.3 pg (ref 26.0–34.0)
MCHC: 32.2 g/dL (ref 30.0–36.0)
MCV: 91.1 fL (ref 80.0–100.0)
Platelets: 144 10*3/uL — ABNORMAL LOW (ref 150–400)
RBC: 3.82 MIL/uL — ABNORMAL LOW (ref 3.87–5.11)
RDW: 17.2 % — ABNORMAL HIGH (ref 11.5–15.5)
WBC: 8.8 10*3/uL (ref 4.0–10.5)
nRBC: 0 % (ref 0.0–0.2)

## 2020-09-20 LAB — BASIC METABOLIC PANEL
Anion gap: 10 (ref 5–15)
BUN: 18 mg/dL (ref 8–23)
CO2: 26 mmol/L (ref 22–32)
Calcium: 9.3 mg/dL (ref 8.9–10.3)
Chloride: 102 mmol/L (ref 98–111)
Creatinine, Ser: 1.03 mg/dL — ABNORMAL HIGH (ref 0.44–1.00)
GFR, Estimated: 55 mL/min — ABNORMAL LOW (ref >60)
Glucose, Bld: 111 mg/dL — ABNORMAL HIGH (ref 70–99)
Potassium: 3.9 mmol/L (ref 3.5–5.1)
Sodium: 138 mmol/L (ref 135–145)

## 2020-09-20 LAB — MAGNESIUM: Magnesium: 1.5 mg/dL — ABNORMAL LOW (ref 1.7–2.4)

## 2020-09-20 MED ORDER — MAGNESIUM SULFATE 2 GM/50ML IV SOLN
2.0000 g | Freq: Once | INTRAVENOUS | Status: AC
  Administered 2020-09-20: 2 g via INTRAVENOUS
  Filled 2020-09-20: qty 50

## 2020-09-20 NOTE — Progress Notes (Signed)
Physical Therapy Treatment Patient Details Name: CAYLEY PESTER MRN: [id number] DOB: 08/22/1940 Today's Date: 09/20/2020    History of Present Illness Daine Floras , a 80 y.o. female  was evaluated in triage.  Pt complains of nontraumatic right groin pain. Cleaning and organizing her house yesterday. Pain began last night. Feels like cramps when drawing her leg up. Exacerbated by movement. Relieved by rest. History of same with unremarkable work-up.    PT Comments    Patient alert, in bed, agreeable to PT. Pt demonstrated improvement in mobility as well as ambulation distances this AM. She performed supine to sit with supervision. Good sitting balance noted. Sit <> stand ultimately better than last attempt, but minA for light assist into standing with RW. She ambulated ~164ft with RW and CGA. Progressed from step to gait pattern to near step through with decreased weight in UE with cueing. Pt returned to room, in chair with all needs in reach. The patient would benefit from further skilled PT intervention to continue to progress towards goals. Recommendation remains appropriate.       Follow Up Recommendations  Home health PT;Supervision/Assistance - 24 hour;Supervision for mobility/OOB     Equipment Recommendations  Rolling walker with 5" wheels    Recommendations for Other Services       Precautions / Restrictions Precautions Precautions: None Restrictions Weight Bearing Restrictions: No    Mobility  Bed Mobility Overal bed mobility: Needs Assistance Bed Mobility: Supine to Sit     Supine to sit: Supervision;HOB elevated        Transfers Overall transfer level: Needs assistance Equipment used: Rolling walker (2 wheeled) Transfers: Sit to/from Stand Sit to Stand: Min assist         General transfer comment: minA for very light assist to stand this session. cued for hand placement on RW  Ambulation/Gait Ambulation/Gait assistance: Min guard Gait Distance (Feet):  120 Feet Assistive device: Rolling walker (2 wheeled) Gait Pattern/deviations: Step-to pattern;Decreased step length - right;Decreased weight shift to right Gait velocity: decr   General Gait Details: cued for step to gait pattern, was able to progress to near step through pattern by end of ambulation. cued for decreased UE weight bearing as able   Stairs             Wheelchair Mobility    Modified Rankin (Stroke Patients Only)       Balance Overall balance assessment: Needs assistance Sitting-balance support: Feet supported Sitting balance-Leahy Scale: Good     Standing balance support: During functional activity;Bilateral upper extremity supported Standing balance-Leahy Scale: Fair                              Cognition Arousal/Alertness: Awake/alert Behavior During Therapy: WFL for tasks assessed/performed Overall Cognitive Status: Within Functional Limits for tasks assessed                                        Exercises      General Comments        Pertinent Vitals/Pain Pain Assessment: 0-10 Pain Score: 8  Pain Location: R hip/groin Pain Descriptors / Indicators: Guarding;Aching;Sore Pain Intervention(s): Limited activity within patient's tolerance;Monitored during session;Premedicated before session    Home Living  Prior Function            PT Goals (current goals can now be found in the care plan section) Progress towards PT goals: Progressing toward goals    Frequency    Min 2X/week      PT Plan Current plan remains appropriate    Co-evaluation              AM-PAC PT "6 Clicks" Mobility   Outcome Measure  Help needed turning from your back to your side while in a flat bed without using bedrails?: None Help needed moving from lying on your back to sitting on the side of a flat bed without using bedrails?: A Little Help needed moving to and from a bed to a chair  (including a wheelchair)?: A Little Help needed standing up from a chair using your arms (e.g., wheelchair or bedside chair)?: A Little Help needed to walk in hospital room?: A Little Help needed climbing 3-5 steps with a railing? : A Little 6 Click Score: 19    End of Session Equipment Utilized During Treatment: Gait belt Activity Tolerance: Patient tolerated treatment well Patient left: in chair;with chair alarm set;with call bell/phone within reach;with family/visitor present Nurse Communication: Mobility status PT Visit Diagnosis: Other abnormalities of gait and mobility (R26.89);Pain;Muscle weakness (generalized) (M62.81);Difficulty in walking, not elsewhere classified (R26.2) Pain - Right/Left: Right Pain - part of body: Leg     Time: 3570-1779 PT Time Calculation (min) (ACUTE ONLY): 23 min  Charges:  $Therapeutic Exercise: 23-37 mins                     Lieutenant Diego PT, DPT 10:59 AM,09/20/20

## 2020-09-20 NOTE — Discharge Summary (Signed)
Physician Discharge Summary   Brittney Ramirez  female DOB: 15-May-1941  [id number]  PCP: Adin Hector, MD  Admit date: 09/18/2020 Discharge date: 09/20/2020  Admitted From: home Disposition:  home Home Health: Yes CODE STATUS: Full code  Discharge Instructions    Discharge instructions   Complete by: As directed    Because you are on blood thinner, you may have bumped your right hip and caused some bleeding resulted in a hematoma (blood collection).  Please follow up with your primary care doctor and re-scan your right hip after 6 weeks to make sure the hematoma goes away.   Dr. Enzo Bi [facility] Course:  For full details, please see H&P, progress notes, consult notes and ancillary notes.  Briefly,  Brittney Ramirez a 80 y.o.femalewith medical history significant foratrial fibrillation on Xarelto, hyperlipidemia, hypertension, hypothyroid, presented to the emergency department for right hip pain.  She denies falling or trauma. She states the pain started on 09/17/2020. She states that she was cleaning her house and going in and out of cabinets when she noticed the pain.   Right hip pain due to hematoma --CT of the abdomen and pelvis without contrast was completed and showed partially visualized 7 cm heterogeneous lobulated soft tissue lesion along the right greater trochanter. --Discussed with radiologist oncall, who believed the mass most likely a hematoma in someone who takes Xarelto.  Right hip pain improving and hematoma appeared to be improving during hospitalization while still on Xalreto.  Pt is advised to follow up with PCP for re-imaging in 6 weeks to check for resolution of the mass  Inability to ambulate due to right hip pain Generalized weakness -no focal neuro deficit --PT/OT rec SNF initially, however, pt's mobility improved substantially after pain improved.  Pt was able to be discharged home with [facility].  Paroxysmal Atrial  fibrillation -cont home metop and Xarelto  Hypertension --cont home metop  Hyperlipidemia -cont home statin   Discharge Diagnoses:  Principal Problem:   Weakness Active Problems:   CHF (congestive heart failure), NYHA class II, chronic, systolic (HCC)   Atrial fibrillation (HCC)   Hypothyroidism   Hyperlipidemia   Benign essential HTN   Inability to ambulate due to right hip    Discharge Instructions:  Allergies as of 09/20/2020      Reactions   Morphine And Related Nausea And Vomiting   Diarrhea   Alendronate Rash   Buprenorphine Hcl Nausea And Vomiting   Diarrhea   Codeine Nausea Only   Other reaction(s): Other (See Comments) GI upset GI upset   Desipramine Nausea Only      Medication List    TAKE these medications   atorvastatin 40 MG tablet Commonly known as: LIPITOR Take 1 tablet (40 mg total) by mouth at bedtime.   donepezil 5 MG tablet Commonly known as: ARICEPT Take 5 mg by mouth at bedtime.   ferrous sulfate 325 (65 FE) MG EC tablet Take 325 mg by mouth 2 (two) times a week.   HYDROcodone-acetaminophen 5-325 MG tablet Commonly known as: Norco Take 1 tablet by mouth every 6 (six) hours as needed for moderate pain.   latanoprost 0.005 % ophthalmic solution Commonly known as: XALATAN Place 1 drop into both eyes at bedtime.   levothyroxine 88 MCG tablet Commonly known as: SYNTHROID Take 1 tablet (88 mcg total) by mouth daily before breakfast.   methocarbamol 500 MG tablet Commonly known as: ROBAXIN Take 1  tablet (500 mg total) by mouth every 6 (six) hours as needed for muscle spasms.   metoprolol succinate 100 MG 24 hr tablet Commonly known as: TOPROL-XL Take 100 mg by mouth daily.   ondansetron 4 MG disintegrating tablet Commonly known as: Zofran ODT Take 1 tablet (4 mg total) by mouth every 8 (eight) hours as needed for nausea or vomiting.   valsartan-hydrochlorothiazide 160-25 MG tablet Commonly known as: DIOVAN-HCT Take 1 tablet  by mouth daily.   Xarelto 15 MG Tabs tablet Generic drug: Rivaroxaban Take 15 mg by mouth daily.        Follow-up Information    Leim Fabry, MD. Schedule an appointment as soon as possible for a visit in 3 days.   Specialty: Orthopedic Surgery Contact information: [place] [address] [phone]        Tama High III, MD. Schedule an appointment as soon as possible for a visit in 1 week(s).   Specialty: Internal Medicine Contact information: [address] [facility] [place] [address] [phone]               Allergies  Allergen Reactions  . Morphine And Related Nausea And Vomiting    Diarrhea  . Alendronate Rash  . Buprenorphine Hcl Nausea And Vomiting    Diarrhea  . Codeine Nausea Only    Other reaction(s): Other (See Comments) GI upset GI upset   . Desipramine Nausea Only     The results of significant diagnostics from this hospitalization (including imaging, microbiology, ancillary and laboratory) are listed below for reference.   Consultations:   Procedures/Studies: CT Abdomen Pelvis Wo Contrast  Result Date: 09/18/2020 CLINICAL DATA:  Abdominal pain nonlocalized. EXAM: CT ABDOMEN AND PELVIS WITHOUT CONTRAST TECHNIQUE: Multidetector CT imaging of the abdomen and pelvis was performed following the standard protocol without IV contrast. COMPARISON:  CT chest 11/28/2016, CT abdomen pelvis 04/02/2015. FINDINGS: Lower chest: Bibasilar subsegmental atelectasis. Right lower lobe linear scarring versus atelectasis.Aortic root calcifications. Coronary artery calcifications. Hepatobiliary: No focal liver abnormality. Status post cholecystectomy. No biliary dilatation. Pancreas: No focal lesion. Normal pancreatic contour. No surrounding inflammatory changes. No main pancreatic ductal dilatation. Spleen: Normal in size without focal abnormality. Adrenals/Urinary Tract: No adrenal nodule bilaterally.  Similar-appearing 1.6 cm coarsely calcified exophytic lesion arising from the right kidney versus associated adjacent fat necrosis. Subcentimeter hypodensities are too small to characterize. No nephrolithiasis, no hydronephrosis, and no contour-deforming renal mass. No ureterolithiasis or hydroureter. The urinary bladder is unremarkable. Stomach/Bowel: Stomach is within normal limits. No evidence of bowel wall thickening or dilatation. Diffuse sigmoid diverticulosis. Scattered colonic diverticulosis of the remaining large bowel. Fatty infiltration of the ascending colon wall suggestive of chronic inflammatory changes. The appendix not definitely identified. Vascular/Lymphatic: No abdominal aorta or iliac aneurysm. Moderate atherosclerotic plaque of the aorta and its branches. No abdominal, pelvic, or inguinal lymphadenopathy. Reproductive: Uterus and bilateral adnexa are unremarkable. Other: No intraperitoneal free fluid. No intraperitoneal free gas. No organized fluid collection. Musculoskeletal: Partially visualized heterogeneous lobulated soft tissue lesion along the right greater trochanter (2:83). This lesion measures at least up to 7 cm. No suspicious lytic or blastic osseous lesions. No acute displaced fracture. Old healed left superior inferior pubic rami fractures. Multilevel severe degenerative changes of the spine with intervertebral disc space vacuum phenomenon and endplate sclerosis. IMPRESSION: 1. Interval development of a partially visualized at least 7cm heterogeneous lobulated soft tissue lesion along the right greater trochanter. Finding could represent bursitis versus hematoma versus  mass lesion formation. 2. Colonic diverticulosis with no acute diverticulitis. Electronically Signed   By: Iven Finn M.D.   On: 09/18/2020 04:17   MR BRAIN WO CONTRAST  Result Date: 09/12/2020 CLINICAL DATA:  Mild cognitive impairment. EXAM: MRI HEAD WITHOUT CONTRAST TECHNIQUE: Multiplanar, multiecho pulse  sequences of the brain and surrounding structures were obtained without intravenous contrast. Additionally, using NeuroQuant software a 3D volumetric analysis of the brain was performed and is compared to a normative database adjusted for age, gender and intracranial volume. COMPARISON:  Head CT 12/22/2017 FINDINGS: Brain: There is no evidence of an acute infarct, intracranial hemorrhage, mass, or midline shift. Small T2 hyperintensities in the cerebral white matter bilaterally are nonspecific but compatible with mild chronic small vessel ischemic disease. A small amount of asymmetric extra-axial fluid in the posterior fossa lateral to the left cerebellar hemisphere is unchanged and without significant associated mass effect. There is cerebral atrophy with ex vacuo dilatation of the ventricles. Vascular: Major intracranial vascular flow voids are preserved. Skull and upper cervical spine: Unremarkable bone marrow signal. Sinuses/Orbits: Bilateral cataract extraction. Paranasal sinuses and mastoid air cells are clear. Other: None. NeuroQuant Findings: Volumetric analysis of the brain was performed, with a fully detailed report in [facility]. Briefly, the comparison with age and gender matched reference reveals whole brain volume to be greatly decreased (less than the first percentile). IMPRESSION: 1. No acute intracranial abnormality. 2. Mild chronic small vessel ischemic disease. 3. NeuroQuant volumetric analysis of the brain, see details on [employer]. Electronically Signed   By: Logan Bores M.D.   On: 09/12/2020 18:28   DG Hip Unilat With Pelvis 2-3 Views Right  Result Date: 09/18/2020 CLINICAL DATA:  Right groin pain EXAM: DG HIP (WITH OR WITHOUT PELVIS) 3V RIGHT COMPARISON:  02/08/2015 FINDINGS: Pelvic ring again demonstrates old healed left pubic rami fracture stable from the prior exam. No acute fracture is seen. Degenerative changes of the hip joints and lumbar spine are seen. No acute fracture or  dislocation is noted. No soft tissue abnormality is seen. IMPRESSION: Chronic changes without acute abnormality. Electronically Signed   By: Inez Catalina M.D.   On: 09/18/2020 01:51      Labs: BNP (last 3 results) No results for input(s): BNP in the last 8760 hours. Basic Metabolic Panel: Recent Labs  Lab 09/18/20 0113 09/19/20 0508 09/20/20 0402  NA 140 140 138  K 4.0 3.9 3.9  CL 103 103 102  CO2 22 26 26   GLUCOSE 122* 107* 111*  BUN 20 15 18   CREATININE 1.23* 1.05* 1.03*  CALCIUM 9.7 9.4 9.3  MG  --   --  1.5*   Liver Function Tests: Recent Labs  Lab 09/18/20 0113  AST 21  ALT 12  ALKPHOS 54  BILITOT 0.5  PROT 7.0  ALBUMIN 4.2   No results for input(s): LIPASE, AMYLASE in the last 168 hours. No results for input(s): AMMONIA in the last 168 hours. CBC: Recent Labs  Lab 09/18/20 0113 09/19/20 0508 09/20/20 0402  WBC 9.2 7.2 8.8  NEUTROABS 5.4  --   --   HGB 11.4* 11.1* 11.2*  HCT 35.9* 34.9* 34.8*  MCV 92.5 92.8 91.1  PLT 149* 154 144*   Cardiac Enzymes: Recent Labs  Lab 09/18/20 0113  CKTOTAL 59   BNP: Invalid input(s): POCBNP CBG: No results for input(s): GLUCAP in the last 168 hours. D-Dimer No results for input(s): DDIMER in the last 72 hours. Hgb A1c No results for input(s): HGBA1C  in the last 72 hours. Lipid Profile No results for input(s): CHOL, HDL, LDLCALC, TRIG, CHOLHDL, LDLDIRECT in the last 72 hours. Thyroid function studies No results for input(s): TSH, T4TOTAL, T3FREE, THYROIDAB in the last 72 hours.  Invalid input(s): FREET3 Anemia work up No results for input(s): VITAMINB12, FOLATE, FERRITIN, TIBC, IRON, RETICCTPCT in the last 72 hours. Urinalysis    Component Value Date/Time   COLORURINE YELLOW (A) 09/19/2020 0450   APPEARANCEUR CLEAR (A) 09/19/2020 0450   APPEARANCEUR Cloudy (A) 06/17/2018 1017   LABSPEC 1.010 09/19/2020 0450   PHURINE 6.0 09/19/2020 0450   GLUCOSEU NEGATIVE 09/19/2020 0450   HGBUR NEGATIVE 09/19/2020  0450   BILIRUBINUR NEGATIVE 09/19/2020 0450   BILIRUBINUR Negative 06/17/2018 1017   KETONESUR NEGATIVE 09/19/2020 0450   PROTEINUR NEGATIVE 09/19/2020 0450   NITRITE NEGATIVE 09/19/2020 0450   LEUKOCYTESUR NEGATIVE 09/19/2020 0450   Sepsis Labs Invalid input(s): PROCALCITONIN,  WBC,  LACTICIDVEN Microbiology Recent Results (from the past 240 hour(s))  SARS CORONAVIRUS 2 (TAT 6-24 HRS) Nasopharyngeal Nasopharyngeal Swab     Status: None   Collection Time: 09/18/20 12:12 PM   Specimen: Nasopharyngeal Swab  Result Value Ref Range Status   SARS Coronavirus 2 NEGATIVE NEGATIVE Final    Comment: (NOTE) SARS-CoV-2 target nucleic acids are NOT DETECTED.  The SARS-CoV-2 RNA is generally detectable in upper and lower respiratory specimens during the acute phase of infection. Negative results do not preclude SARS-CoV-2 infection, do not rule out co-infections with other pathogens, and should not be used as the sole basis for treatment or other patient management decisions. Negative results must be combined with clinical observations, patient history, and epidemiological information. The expected result is Negative.  Fact Sheet for Patients: SugarRoll.be  Fact Sheet for Healthcare Providers: https://www.woods-mathews.com/  This test is not yet approved or cleared by the [place] FDA and  has been authorized for detection and/or diagnosis of SARS-CoV-2 by FDA under an Emergency Use Authorization (EUA). This EUA will remain  in effect (meaning this test can be used) for the duration of the COVID-19 declaration under Se ction 564(b)(1) of the Act, 21 U.S.C. section 360bbb-3(b)(1), unless the authorization is terminated or revoked sooner.  Performed at [facility], [place] [address], [place], [place] [address]      Total time spend on discharging this patient, including the last patient exam, discussing the hospital stay, instructions  for ongoing care as it relates to all pertinent caregivers, as well as preparing the medical discharge records, prescriptions, and/or referrals as applicable, is 40 minutes.    Enzo Bi, MD  [facility] 09/20/2020, 9:46 AM

## 2020-09-20 NOTE — Progress Notes (Signed)
Pt discharged home. Husband here to pick her up. Offers no complains of pain or any discomfort.

## 2020-09-25 DIAGNOSIS — M25551 Pain in right hip: Secondary | ICD-10-CM | POA: Diagnosis not present

## 2020-09-25 DIAGNOSIS — M7061 Trochanteric bursitis, right hip: Secondary | ICD-10-CM | POA: Diagnosis not present

## 2020-09-25 DIAGNOSIS — M1611 Unilateral primary osteoarthritis, right hip: Secondary | ICD-10-CM | POA: Diagnosis not present

## 2020-09-27 DIAGNOSIS — I5022 Chronic systolic (congestive) heart failure: Secondary | ICD-10-CM | POA: Diagnosis not present

## 2020-09-27 DIAGNOSIS — I110 Hypertensive heart disease with heart failure: Secondary | ICD-10-CM | POA: Diagnosis not present

## 2020-09-27 DIAGNOSIS — E785 Hyperlipidemia, unspecified: Secondary | ICD-10-CM | POA: Diagnosis not present

## 2020-09-27 DIAGNOSIS — I482 Chronic atrial fibrillation, unspecified: Secondary | ICD-10-CM | POA: Diagnosis not present

## 2020-09-27 DIAGNOSIS — I2510 Atherosclerotic heart disease of native coronary artery without angina pectoris: Secondary | ICD-10-CM | POA: Diagnosis not present

## 2020-09-27 DIAGNOSIS — I700 Atherosclerosis of aorta: Secondary | ICD-10-CM | POA: Diagnosis not present

## 2020-09-27 DIAGNOSIS — K219 Gastro-esophageal reflux disease without esophagitis: Secondary | ICD-10-CM | POA: Diagnosis not present

## 2020-10-15 DIAGNOSIS — E782 Mixed hyperlipidemia: Secondary | ICD-10-CM | POA: Diagnosis not present

## 2020-10-15 DIAGNOSIS — D62 Acute posthemorrhagic anemia: Secondary | ICD-10-CM | POA: Diagnosis not present

## 2020-10-15 DIAGNOSIS — N1832 Chronic kidney disease, stage 3b: Secondary | ICD-10-CM | POA: Diagnosis not present

## 2020-10-22 DIAGNOSIS — K219 Gastro-esophageal reflux disease without esophagitis: Secondary | ICD-10-CM | POA: Diagnosis not present

## 2020-10-22 DIAGNOSIS — I482 Chronic atrial fibrillation, unspecified: Secondary | ICD-10-CM | POA: Diagnosis not present

## 2020-10-22 DIAGNOSIS — I700 Atherosclerosis of aorta: Secondary | ICD-10-CM | POA: Diagnosis not present

## 2020-10-22 DIAGNOSIS — I5022 Chronic systolic (congestive) heart failure: Secondary | ICD-10-CM | POA: Diagnosis not present

## 2020-10-22 DIAGNOSIS — I110 Hypertensive heart disease with heart failure: Secondary | ICD-10-CM | POA: Diagnosis not present

## 2020-10-22 DIAGNOSIS — I2510 Atherosclerotic heart disease of native coronary artery without angina pectoris: Secondary | ICD-10-CM | POA: Diagnosis not present

## 2020-10-22 DIAGNOSIS — E782 Mixed hyperlipidemia: Secondary | ICD-10-CM | POA: Diagnosis not present

## 2020-10-22 DIAGNOSIS — Z0000 Encounter for general adult medical examination without abnormal findings: Secondary | ICD-10-CM | POA: Diagnosis not present

## 2020-10-23 DIAGNOSIS — M25551 Pain in right hip: Secondary | ICD-10-CM | POA: Diagnosis not present

## 2020-10-23 DIAGNOSIS — M7061 Trochanteric bursitis, right hip: Secondary | ICD-10-CM | POA: Diagnosis not present

## 2020-10-23 DIAGNOSIS — M1611 Unilateral primary osteoarthritis, right hip: Secondary | ICD-10-CM | POA: Diagnosis not present

## 2020-11-05 DIAGNOSIS — H16002 Unspecified corneal ulcer, left eye: Secondary | ICD-10-CM | POA: Diagnosis not present

## 2020-11-05 DIAGNOSIS — H401131 Primary open-angle glaucoma, bilateral, mild stage: Secondary | ICD-10-CM | POA: Diagnosis not present

## 2020-11-16 DIAGNOSIS — J301 Allergic rhinitis due to pollen: Secondary | ICD-10-CM | POA: Diagnosis not present

## 2020-11-16 DIAGNOSIS — R040 Epistaxis: Secondary | ICD-10-CM | POA: Diagnosis not present

## 2020-11-19 DIAGNOSIS — H16002 Unspecified corneal ulcer, left eye: Secondary | ICD-10-CM | POA: Diagnosis not present

## 2020-11-22 ENCOUNTER — Other Ambulatory Visit: Payer: Self-pay | Admitting: Internal Medicine

## 2020-11-22 DIAGNOSIS — Z1231 Encounter for screening mammogram for malignant neoplasm of breast: Secondary | ICD-10-CM

## 2020-12-04 DIAGNOSIS — H401131 Primary open-angle glaucoma, bilateral, mild stage: Secondary | ICD-10-CM | POA: Diagnosis not present

## 2020-12-04 DIAGNOSIS — H16002 Unspecified corneal ulcer, left eye: Secondary | ICD-10-CM | POA: Diagnosis not present

## 2020-12-06 ENCOUNTER — Ambulatory Visit
Admission: RE | Admit: 2020-12-06 | Discharge: 2020-12-06 | Disposition: A | Discharge status: Home / Self Care | Payer: Medicare HMO | Source: Ambulatory Visit | Attending: Internal Medicine | Admitting: Internal Medicine

## 2020-12-06 ENCOUNTER — Other Ambulatory Visit: Payer: Self-pay

## 2020-12-06 DIAGNOSIS — Z1231 Encounter for screening mammogram for malignant neoplasm of breast: Secondary | ICD-10-CM | POA: Diagnosis not present

## 2020-12-11 ENCOUNTER — Ambulatory Visit: Payer: Medicare HMO | Admitting: Dermatology

## 2020-12-13 ENCOUNTER — Ambulatory Visit: Payer: Medicare HMO | Admitting: Dermatology

## 2020-12-13 ENCOUNTER — Other Ambulatory Visit: Payer: Self-pay

## 2020-12-13 DIAGNOSIS — L578 Other skin changes due to chronic exposure to nonionizing radiation: Secondary | ICD-10-CM

## 2020-12-13 DIAGNOSIS — L820 Inflamed seborrheic keratosis: Secondary | ICD-10-CM | POA: Diagnosis not present

## 2020-12-13 DIAGNOSIS — L570 Actinic keratosis: Secondary | ICD-10-CM

## 2020-12-13 NOTE — Patient Instructions (Addendum)

## 2020-12-13 NOTE — Progress Notes (Signed)
   New Patient Visit  Subjective  Brittney Ramirez is a 80 y.o. female who presents for the following: Skin Problem (Pt c/o growths on her face, neck and ears burning and hurting ).  The following portions of the chart were reviewed this encounter and updated as appropriate:   Tobacco  Allergies  Meds  Problems  Med Hx  Surg Hx  Fam Hx     Review of Systems:  No other skin or systemic complaints except as noted in HPI or Assessment and Plan.  Objective  Well appearing patient in no apparent distress; mood and affect are within normal limits.  A focused examination was performed including face, ears, neck . Relevant physical exam findings are noted in the Assessment and Plan.  Objective  face (9): Erythematous thin papules/macules with gritty scale.   Objective  right lateral neck x 2, right ear helix x 1 (3): Erythematous keratotic or waxy stuck-on papule or plaque.    Assessment & Plan  AK (actinic keratosis) (9) face  Actinic keratoses are precancerous spots that appear secondary to cumulative UV radiation exposure/sun exposure over time. They are chronic with expected duration over 1 year. A portion of actinic keratoses will progress to squamous cell carcinoma of the skin. It is not possible to reliably predict which spots will progress to skin cancer and so treatment is recommended to prevent development of skin cancer.  Recommend daily broad spectrum sunscreen SPF 30+ to sun-exposed areas, reapply every 2 hours as needed.  Recommend staying in the shade or wearing long sleeves, sun glasses (UVA+UVB protection) and wide brim hats (4-inch brim around the entire circumference of the hat). Call for new or changing lesions.   Destruction of lesion - face Complexity: simple   Destruction method: cryotherapy   Informed consent: discussed and consent obtained   Timeout:  patient name, date of birth, surgical site, and procedure verified Lesion destroyed using liquid nitrogen:  Yes   Region frozen until ice ball extended beyond lesion: Yes   Outcome: patient tolerated procedure well with no complications   Post-procedure details: wound care instructions given    Inflamed seborrheic keratosis (3) right lateral neck x 2, right ear helix x 1  Destruction of lesion - right lateral neck x 2, right ear helix x 1 Complexity: simple   Destruction method: cryotherapy   Informed consent: discussed and consent obtained   Timeout:  patient name, date of birth, surgical site, and procedure verified Lesion destroyed using liquid nitrogen: Yes   Region frozen until ice ball extended beyond lesion: Yes   Outcome: patient tolerated procedure well with no complications   Post-procedure details: wound care instructions given    Actinic Damage - chronic, secondary to cumulative UV radiation exposure/sun exposure over time - diffuse scaly erythematous macules with underlying dyspigmentation - Recommend daily broad spectrum sunscreen SPF 30+ to sun-exposed areas, reapply every 2 hours as needed.  - Recommend staying in the shade or wearing long sleeves, sun glasses (UVA+UVB protection) and wide brim hats (4-inch brim around the entire circumference of the hat). - Call for new or changing lesions.  Return if symptoms worsen or fail to improve.  IMarye Round, CMA, am acting as scribe for Sarina Ser, MD .  Documentation: I have reviewed the above documentation for accuracy and completeness, and I agree with the above.  Sarina Ser, MD

## 2020-12-16 ENCOUNTER — Encounter: Payer: Self-pay | Admitting: Dermatology

## 2020-12-27 DIAGNOSIS — E538 Deficiency of other specified B group vitamins: Secondary | ICD-10-CM | POA: Diagnosis not present

## 2020-12-27 DIAGNOSIS — F0280 Dementia in other diseases classified elsewhere without behavioral disturbance: Secondary | ICD-10-CM | POA: Diagnosis not present

## 2020-12-27 DIAGNOSIS — F0150 Vascular dementia without behavioral disturbance: Secondary | ICD-10-CM | POA: Diagnosis not present

## 2020-12-27 DIAGNOSIS — E559 Vitamin D deficiency, unspecified: Secondary | ICD-10-CM | POA: Diagnosis not present

## 2020-12-27 DIAGNOSIS — G309 Alzheimer's disease, unspecified: Secondary | ICD-10-CM | POA: Diagnosis not present

## 2020-12-27 DIAGNOSIS — Z79899 Other long term (current) drug therapy: Secondary | ICD-10-CM | POA: Diagnosis not present

## 2020-12-31 DIAGNOSIS — I10 Essential (primary) hypertension: Secondary | ICD-10-CM | POA: Diagnosis not present

## 2020-12-31 DIAGNOSIS — E559 Vitamin D deficiency, unspecified: Secondary | ICD-10-CM | POA: Diagnosis not present

## 2020-12-31 DIAGNOSIS — R8290 Unspecified abnormal findings in urine: Secondary | ICD-10-CM | POA: Diagnosis not present

## 2020-12-31 DIAGNOSIS — E538 Deficiency of other specified B group vitamins: Secondary | ICD-10-CM | POA: Diagnosis not present

## 2020-12-31 DIAGNOSIS — E611 Iron deficiency: Secondary | ICD-10-CM | POA: Diagnosis not present

## 2020-12-31 DIAGNOSIS — R600 Localized edema: Secondary | ICD-10-CM | POA: Diagnosis not present

## 2020-12-31 DIAGNOSIS — N1832 Chronic kidney disease, stage 3b: Secondary | ICD-10-CM | POA: Diagnosis not present

## 2020-12-31 DIAGNOSIS — Z9229 Personal history of other drug therapy: Secondary | ICD-10-CM | POA: Diagnosis not present

## 2021-01-07 DIAGNOSIS — I10 Essential (primary) hypertension: Secondary | ICD-10-CM | POA: Diagnosis not present

## 2021-01-07 DIAGNOSIS — N1831 Chronic kidney disease, stage 3a: Secondary | ICD-10-CM | POA: Diagnosis not present

## 2021-01-07 DIAGNOSIS — R600 Localized edema: Secondary | ICD-10-CM | POA: Diagnosis not present

## 2021-03-12 DIAGNOSIS — H18892 Other specified disorders of cornea, left eye: Secondary | ICD-10-CM | POA: Diagnosis not present

## 2021-03-12 DIAGNOSIS — H401131 Primary open-angle glaucoma, bilateral, mild stage: Secondary | ICD-10-CM | POA: Diagnosis not present

## 2021-04-09 DIAGNOSIS — I10 Essential (primary) hypertension: Secondary | ICD-10-CM | POA: Diagnosis not present

## 2021-04-09 DIAGNOSIS — I340 Nonrheumatic mitral (valve) insufficiency: Secondary | ICD-10-CM | POA: Diagnosis not present

## 2021-04-09 DIAGNOSIS — I482 Chronic atrial fibrillation, unspecified: Secondary | ICD-10-CM | POA: Diagnosis not present

## 2021-04-09 DIAGNOSIS — N1832 Chronic kidney disease, stage 3b: Secondary | ICD-10-CM | POA: Diagnosis not present

## 2021-04-09 DIAGNOSIS — I700 Atherosclerosis of aorta: Secondary | ICD-10-CM | POA: Diagnosis not present

## 2021-04-09 DIAGNOSIS — F0390 Unspecified dementia without behavioral disturbance: Secondary | ICD-10-CM | POA: Diagnosis not present

## 2021-04-09 DIAGNOSIS — I2510 Atherosclerotic heart disease of native coronary artery without angina pectoris: Secondary | ICD-10-CM | POA: Diagnosis not present

## 2021-04-09 DIAGNOSIS — E782 Mixed hyperlipidemia: Secondary | ICD-10-CM | POA: Diagnosis not present

## 2021-04-09 DIAGNOSIS — I5022 Chronic systolic (congestive) heart failure: Secondary | ICD-10-CM | POA: Diagnosis not present

## 2021-04-17 DIAGNOSIS — N1832 Chronic kidney disease, stage 3b: Secondary | ICD-10-CM | POA: Diagnosis not present

## 2021-04-17 DIAGNOSIS — E782 Mixed hyperlipidemia: Secondary | ICD-10-CM | POA: Diagnosis not present

## 2021-04-23 ENCOUNTER — Other Ambulatory Visit: Payer: Self-pay

## 2021-04-23 ENCOUNTER — Encounter: Payer: Self-pay | Admitting: Emergency Medicine

## 2021-04-23 ENCOUNTER — Emergency Department: Payer: Medicare HMO

## 2021-04-23 ENCOUNTER — Emergency Department
Admission: EM | Admit: 2021-04-23 | Discharge: 2021-04-23 | Disposition: A | Discharge status: Home / Self Care | Payer: Medicare HMO | Attending: Emergency Medicine | Admitting: Emergency Medicine

## 2021-04-23 DIAGNOSIS — R252 Cramp and spasm: Secondary | ICD-10-CM | POA: Insufficient documentation

## 2021-04-23 DIAGNOSIS — M79604 Pain in right leg: Secondary | ICD-10-CM | POA: Insufficient documentation

## 2021-04-23 DIAGNOSIS — I130 Hypertensive heart and chronic kidney disease with heart failure and stage 1 through stage 4 chronic kidney disease, or unspecified chronic kidney disease: Secondary | ICD-10-CM | POA: Diagnosis not present

## 2021-04-23 DIAGNOSIS — M7989 Other specified soft tissue disorders: Secondary | ICD-10-CM | POA: Diagnosis not present

## 2021-04-23 DIAGNOSIS — Z9660 Presence of unspecified orthopedic joint implant: Secondary | ICD-10-CM | POA: Insufficient documentation

## 2021-04-23 DIAGNOSIS — M79661 Pain in right lower leg: Secondary | ICD-10-CM | POA: Diagnosis not present

## 2021-04-23 DIAGNOSIS — Z79899 Other long term (current) drug therapy: Secondary | ICD-10-CM | POA: Insufficient documentation

## 2021-04-23 DIAGNOSIS — Z7901 Long term (current) use of anticoagulants: Secondary | ICD-10-CM | POA: Insufficient documentation

## 2021-04-23 DIAGNOSIS — I5022 Chronic systolic (congestive) heart failure: Secondary | ICD-10-CM | POA: Insufficient documentation

## 2021-04-23 DIAGNOSIS — Z87891 Personal history of nicotine dependence: Secondary | ICD-10-CM | POA: Insufficient documentation

## 2021-04-23 DIAGNOSIS — E039 Hypothyroidism, unspecified: Secondary | ICD-10-CM | POA: Diagnosis not present

## 2021-04-23 DIAGNOSIS — N183 Chronic kidney disease, stage 3 unspecified: Secondary | ICD-10-CM | POA: Diagnosis not present

## 2021-04-23 LAB — COMPREHENSIVE METABOLIC PANEL
ALT: 14 U/L (ref 0–44)
AST: 26 U/L (ref 15–41)
Albumin: 4.3 g/dL (ref 3.5–5.0)
Alkaline Phosphatase: 49 U/L (ref 38–126)
Anion gap: 8 (ref 5–15)
BUN: 15 mg/dL (ref 8–23)
CO2: 25 mmol/L (ref 22–32)
Calcium: 9.8 mg/dL (ref 8.9–10.3)
Chloride: 105 mmol/L (ref 98–111)
Creatinine, Ser: 0.97 mg/dL (ref 0.44–1.00)
GFR, Estimated: 58 mL/min — ABNORMAL LOW (ref >60)
Glucose, Bld: 101 mg/dL — ABNORMAL HIGH (ref 70–99)
Potassium: 4 mmol/L (ref 3.5–5.1)
Sodium: 138 mmol/L (ref 135–145)
Total Bilirubin: 1 mg/dL (ref 0.3–1.2)
Total Protein: 7.2 g/dL (ref 6.5–8.1)

## 2021-04-23 LAB — CBC WITH DIFFERENTIAL/PLATELET
Abs Immature Granulocytes: 0.02 10*3/uL (ref 0.00–0.07)
Basophils Absolute: 0.1 10*3/uL (ref 0.0–0.1)
Basophils Relative: 1 %
Eosinophils Absolute: 1.3 10*3/uL — ABNORMAL HIGH (ref 0.0–0.5)
Eosinophils Relative: 16 %
HCT: 42.1 % (ref 36.0–46.0)
Hemoglobin: 13.7 g/dL (ref 12.0–15.0)
Immature Granulocytes: 0 %
Lymphocytes Relative: 34 %
Lymphs Abs: 2.7 10*3/uL (ref 0.7–4.0)
MCH: 31.3 pg (ref 26.0–34.0)
MCHC: 32.5 g/dL (ref 30.0–36.0)
MCV: 96.1 fL (ref 80.0–100.0)
Monocytes Absolute: 0.5 10*3/uL (ref 0.1–1.0)
Monocytes Relative: 7 %
Neutro Abs: 3.4 10*3/uL (ref 1.7–7.7)
Neutrophils Relative %: 42 %
Platelets: 161 10*3/uL (ref 150–400)
RBC: 4.38 MIL/uL (ref 3.87–5.11)
RDW: 14.8 % (ref 11.5–15.5)
WBC: 7.9 10*3/uL (ref 4.0–10.5)
nRBC: 0 % (ref 0.0–0.2)

## 2021-04-23 MED ORDER — ACETAMINOPHEN 500 MG PO TABS
1000.0000 mg | ORAL_TABLET | Freq: Once | ORAL | Status: AC
  Administered 2021-04-23: 1000 mg via ORAL
  Filled 2021-04-23: qty 2

## 2021-04-23 NOTE — ED Provider Notes (Signed)
[facility]  ____________________________________________   Event Date/Time   First MD Initiated Contact with Patient 04/23/21 1645     (approximate)  I have reviewed the triage vital signs and the nursing notes.   HISTORY  Chief Complaint Leg Pain    HPI Brittney Ramirez is a 80 y.o. female with past medical history of atrial fibrillation on Xarelto, CKD who presents with right leg pain.  Symptoms started about 3 days ago.  She is notes intermittent swelling and feeling like of the right calf is.  Pain is intermittent.  She denies associated injury however she was on her feet lifting things over her head and thinks she may have been using the calf muscles during that time more than usual.  She denies associated shortness of breath or chest pain.  She is compliant with the Xarelto         Past Medical History:  Diagnosis Date   Arm fracture    Atrial fibrillation ([place])    Chronic kidney disease    Elevated glucose    Hyperlipidemia    Hypertension    Jaw fracture (HCC)    Miscarriage    Osteoporosis    Sleep apnea    Thyroid cyst     Patient Active Problem List   Diagnosis Date Noted   Inability to ambulate due to right hip 09/19/2020   Weakness 09/18/2020   Cerumen debris on tympanic membrane, right 06/17/2018   UTI (urinary tract infection) 06/17/2018   Multiple rib fractures 01/27/2018   Advanced care planning/counseling discussion 06/11/2017   Thoracic aortic aneurysm without rupture ([place]) 12/09/2016   Aortic calcification (HCC) 12/09/2016   SOBOE (shortness of breath on exertion) 11/03/2016   Gastroesophageal reflux disease without esophagitis 05/14/2016   CKD (chronic kidney disease) stage 3, GFR 30-59 ml/min (HCC) 05/14/2016   Atrial fibrillation ([place]) 12/05/2015   Hypothyroidism 12/05/2015   Hyperlipidemia 12/05/2015   Hypertensive heart and renal disease with congestive heart failure and renal failure ([place]) 06/07/2015   CHF  (congestive heart failure), NYHA class II, chronic, systolic ([place]) XX123456   Pulmonary nodules [id number]   Chronic systolic CHF (congestive heart failure), NYHA class 2 ([place]) 10/11/2014   Benign essential HTN 04/06/2014   Chronic a-fib ([place]) 04/06/2014    Past Surgical History:  Procedure Laterality Date   CATARACT EXTRACTION Right    CHOLECYSTECTOMY  2014   JOINT REPLACEMENT Right 2003    Prior to Admission medications   Medication Sig Start Date End Date Taking? Authorizing Provider  atorvastatin (LIPITOR) 40 MG tablet Take 1 tablet (40 mg total) by mouth at bedtime. 07/29/19   Volney American, PA-C  donepezil (ARICEPT) 5 MG tablet Take 5 mg by mouth at bedtime. 08/28/20   [provider]  ferrous sulfate 325 (65 FE) MG EC tablet Take 325 mg by mouth 2 (two) times a week.    [provider]  HYDROcodone-acetaminophen (NORCO) 5-325 MG tablet Take 1 tablet by mouth every 6 (six) hours as needed for moderate pain. 09/18/20   Paulette Blanch, MD  latanoprost (XALATAN) 0.005 % ophthalmic solution Place 1 drop into both eyes at bedtime. 08/20/14   [provider]  levothyroxine (SYNTHROID) 88 MCG tablet Take 1 tablet (88 mcg total) by mouth daily before breakfast. 07/29/19   Volney American, PA-C  methocarbamol (ROBAXIN) 500 MG tablet Take 1 tablet (500 mg total) by mouth every 6 (six) hours as needed for muscle spasms. 09/18/20  Paulette Blanch, MD  metoprolol succinate (TOPROL-XL) 100 MG 24 hr tablet Take 100 mg by mouth daily. 08/27/20   [provider]  ondansetron (ZOFRAN ODT) 4 MG disintegrating tablet Take 1 tablet (4 mg total) by mouth every 8 (eight) hours as needed for nausea or vomiting. 09/18/20   Paulette Blanch, MD  valsartan-hydrochlorothiazide (DIOVAN-HCT) 160-25 MG tablet Take 1 tablet by mouth daily. 07/29/19   Volney American, PA-C  XARELTO 15 MG TABS tablet Take 15 mg by mouth daily. 07/08/20   [provider]     Allergies Morphine and related, Alendronate, Buprenorphine hcl, Codeine, and Desipramine  Family History  Problem Relation Age of Onset   Heart disease Mother    Hypertension Mother    Heart disease Father    Hypertension Father    Arthritis Sister    Cancer Sister    Diabetes Sister    Heart disease Sister    Breast cancer Sister 64   Diabetes Brother    Heart disease Brother    Hypertension Brother     Social History Social History   Tobacco Use   Smoking status: Former    Types: Cigarettes    Start date: 02/07/1955    Quit date: 02/08/1955    Years since quitting: 66.2   Smokeless tobacco: Never  Vaping Use   Vaping Use: Never used  Substance Use Topics   Alcohol use: No    Alcohol/week: 0.0 standard drinks   Drug use: No    Review of Systems   Review of Systems  Constitutional:  Negative for chills and fever.  Musculoskeletal:  Positive for arthralgias and myalgias.  Skin:  Negative for color change and rash.  Neurological:  Negative for weakness and numbness.  All other systems reviewed and are negative.  Physical Exam Updated Vital Signs BP 124/79 (BP Location: Right Arm)   Pulse 91   Temp 98.9 F (37.2 C) (Oral)   Resp 20   Ht '5\' 6"'$  (1.676 m)   Wt 78.9 kg   SpO2 100%   BMI 28.08 kg/m   Physical Exam Vitals and nursing note reviewed.  Constitutional:      General: She is not in acute distress.    Appearance: Normal appearance.  HENT:     Head: Normocephalic and atraumatic.  Eyes:     General: No scleral icterus.    Conjunctiva/sclera: Conjunctivae normal.  Pulmonary:     Effort: Pulmonary effort is normal. No respiratory distress.     Breath sounds: Normal breath sounds. No wheezing.  Musculoskeletal:        General: No swelling, tenderness, deformity or signs of injury.     Cervical back: Normal range of motion.     Comments: Right lower extremity is without swelling, deformity or tenderness 2+ dp pulse   Skin:    Coloration:  Skin is not jaundiced or pale.  Neurological:     General: No focal deficit present.     Mental Status: She is alert and oriented to person, place, and time. Mental status is at baseline.  Psychiatric:        Mood and Affect: Mood normal.        Behavior: Behavior normal.     LABS (all labs ordered are listed, but only abnormal results are displayed)  Labs Reviewed  CBC WITH DIFFERENTIAL/PLATELET  COMPREHENSIVE METABOLIC PANEL   ____________________________________________  EKG  N/a ____________________________________________  RADIOLOGY Almeta Monas, personally viewed and evaluated these  images (plain radiographs) as part of my medical decision making, as well as reviewing the written report by the radiologist.  ED MD interpretation: I reviewed the DVT of the right lower extremity which was read by radiology as negative for acute DVT    ____________________________________________   PROCEDURES  Procedure(s) performed (including Critical Care):  Procedures   ____________________________________________   INITIAL IMPRESSION / ASSESSMENT AND PLAN / ED COURSE     80 year old female who presents with cramping and intermittent pain in the right calf.  No trauma but was on her feet more than usual prior to the onset.  Her exam is rather benign without swelling tenderness and she is neurovascular intact.  DVT ultrasound was obtained from triage which is negative as expected.  No signs of infection.  Suspect muscle cramping.  Will be discharged with primary follow-up.      ____________________________________________   FINAL CLINICAL IMPRESSION(S) / ED DIAGNOSES  Final diagnoses:  Leg cramping     ED Discharge Orders     None        Note:  This document was prepared using Dragon voice recognition software and may include unintentional dictation errors.    Rada Hay, MD 04/23/21 [phone]

## 2021-04-23 NOTE — ED Triage Notes (Signed)
Pt reports that her right leg is hurting her, starting on Saturday. She states that it stings and drawls up in a ball when she walks on it. It is hurting in her right calf are. Slight swelling in right calf. No known injury to her leg. She states that this happened a about a year ago and they found blood "pooled up on her bone but not in her blood vessels, she states that it does not hurt as bad as it did last time.

## 2021-04-23 NOTE — Discharge Instructions (Addendum)
Your ultrasound did not show any blood clot today.  Please continue to take your Xarelto.  If you continue to have pain you can take Tylenol and elevate the leg.

## 2021-04-23 NOTE — ED Notes (Addendum)
Pt presents to the ED for RLE swelling for the past 3 days, pt states that she not SOB more than normal. Pt states that it is mildy painful but rates pain 8/10. Denies hx of blood clots but pt is on Xarelto. Pt is A&Ox4 and NAD. Ambulatory with walker.

## 2021-04-24 DIAGNOSIS — I130 Hypertensive heart and chronic kidney disease with heart failure and stage 1 through stage 4 chronic kidney disease, or unspecified chronic kidney disease: Secondary | ICD-10-CM | POA: Diagnosis not present

## 2021-04-24 DIAGNOSIS — E782 Mixed hyperlipidemia: Secondary | ICD-10-CM | POA: Diagnosis not present

## 2021-04-24 DIAGNOSIS — I5022 Chronic systolic (congestive) heart failure: Secondary | ICD-10-CM | POA: Diagnosis not present

## 2021-04-24 DIAGNOSIS — I700 Atherosclerosis of aorta: Secondary | ICD-10-CM | POA: Diagnosis not present

## 2021-04-24 DIAGNOSIS — I2510 Atherosclerotic heart disease of native coronary artery without angina pectoris: Secondary | ICD-10-CM | POA: Diagnosis not present

## 2021-04-24 DIAGNOSIS — N1832 Chronic kidney disease, stage 3b: Secondary | ICD-10-CM | POA: Diagnosis not present

## 2021-04-24 DIAGNOSIS — I482 Chronic atrial fibrillation, unspecified: Secondary | ICD-10-CM | POA: Diagnosis not present

## 2021-05-01 DIAGNOSIS — H5210 Myopia, unspecified eye: Secondary | ICD-10-CM | POA: Diagnosis not present

## 2021-05-01 DIAGNOSIS — H40009 Preglaucoma, unspecified, unspecified eye: Secondary | ICD-10-CM | POA: Diagnosis not present

## 2021-05-01 DIAGNOSIS — I10 Essential (primary) hypertension: Secondary | ICD-10-CM | POA: Diagnosis not present

## 2021-05-01 DIAGNOSIS — E7800 Pure hypercholesterolemia, unspecified: Secondary | ICD-10-CM | POA: Diagnosis not present

## 2021-05-01 DIAGNOSIS — Z0100 Encounter for examination of eyes and vision without abnormal findings: Secondary | ICD-10-CM | POA: Diagnosis not present

## 2021-05-17 DIAGNOSIS — R040 Epistaxis: Secondary | ICD-10-CM | POA: Diagnosis not present

## 2021-05-17 DIAGNOSIS — H6123 Impacted cerumen, bilateral: Secondary | ICD-10-CM | POA: Diagnosis not present

## 2021-05-17 DIAGNOSIS — M5416 Radiculopathy, lumbar region: Secondary | ICD-10-CM | POA: Diagnosis not present

## 2021-05-17 DIAGNOSIS — M5136 Other intervertebral disc degeneration, lumbar region: Secondary | ICD-10-CM | POA: Diagnosis not present

## 2021-05-17 DIAGNOSIS — J301 Allergic rhinitis due to pollen: Secondary | ICD-10-CM | POA: Diagnosis not present

## 2021-05-17 DIAGNOSIS — M47816 Spondylosis without myelopathy or radiculopathy, lumbar region: Secondary | ICD-10-CM | POA: Diagnosis not present

## 2021-05-23 ENCOUNTER — Other Ambulatory Visit (HOSPITAL_BASED_OUTPATIENT_CLINIC_OR_DEPARTMENT_OTHER): Payer: Self-pay | Admitting: Orthopedic Surgery

## 2021-05-23 ENCOUNTER — Other Ambulatory Visit: Payer: Self-pay | Admitting: Orthopedic Surgery

## 2021-05-23 DIAGNOSIS — M545 Low back pain, unspecified: Secondary | ICD-10-CM

## 2021-05-26 ENCOUNTER — Other Ambulatory Visit: Payer: Self-pay

## 2021-05-26 ENCOUNTER — Ambulatory Visit
Admission: RE | Admit: 2021-05-26 | Discharge: 2021-05-26 | Disposition: A | Discharge status: Home / Self Care | Payer: Medicare HMO | Source: Ambulatory Visit | Attending: Orthopedic Surgery | Admitting: Orthopedic Surgery

## 2021-05-26 DIAGNOSIS — M545 Low back pain, unspecified: Secondary | ICD-10-CM | POA: Diagnosis not present

## 2021-06-26 DIAGNOSIS — M5416 Radiculopathy, lumbar region: Secondary | ICD-10-CM | POA: Diagnosis not present

## 2021-06-26 DIAGNOSIS — M48062 Spinal stenosis, lumbar region with neurogenic claudication: Secondary | ICD-10-CM | POA: Diagnosis not present

## 2021-07-15 DIAGNOSIS — N1831 Chronic kidney disease, stage 3a: Secondary | ICD-10-CM | POA: Diagnosis not present

## 2021-07-15 DIAGNOSIS — I10 Essential (primary) hypertension: Secondary | ICD-10-CM | POA: Diagnosis not present

## 2021-07-16 DIAGNOSIS — H401131 Primary open-angle glaucoma, bilateral, mild stage: Secondary | ICD-10-CM | POA: Diagnosis not present

## 2021-07-16 DIAGNOSIS — H18892 Other specified disorders of cornea, left eye: Secondary | ICD-10-CM | POA: Diagnosis not present

## 2021-07-17 DIAGNOSIS — M545 Low back pain, unspecified: Secondary | ICD-10-CM | POA: Diagnosis not present

## 2021-07-17 DIAGNOSIS — M5136 Other intervertebral disc degeneration, lumbar region: Secondary | ICD-10-CM | POA: Diagnosis not present

## 2021-07-17 DIAGNOSIS — M47816 Spondylosis without myelopathy or radiculopathy, lumbar region: Secondary | ICD-10-CM | POA: Diagnosis not present

## 2021-07-17 DIAGNOSIS — M5416 Radiculopathy, lumbar region: Secondary | ICD-10-CM | POA: Diagnosis not present

## 2021-07-23 DIAGNOSIS — M5416 Radiculopathy, lumbar region: Secondary | ICD-10-CM | POA: Diagnosis not present

## 2021-07-23 DIAGNOSIS — M48062 Spinal stenosis, lumbar region with neurogenic claudication: Secondary | ICD-10-CM | POA: Diagnosis not present

## 2021-08-20 DIAGNOSIS — I700 Atherosclerosis of aorta: Secondary | ICD-10-CM | POA: Diagnosis not present

## 2021-08-20 DIAGNOSIS — M47816 Spondylosis without myelopathy or radiculopathy, lumbar region: Secondary | ICD-10-CM | POA: Diagnosis not present

## 2021-08-20 DIAGNOSIS — M1611 Unilateral primary osteoarthritis, right hip: Secondary | ICD-10-CM | POA: Diagnosis not present

## 2021-08-20 DIAGNOSIS — M5416 Radiculopathy, lumbar region: Secondary | ICD-10-CM | POA: Diagnosis not present

## 2021-08-20 DIAGNOSIS — M5136 Other intervertebral disc degeneration, lumbar region: Secondary | ICD-10-CM | POA: Diagnosis not present

## 2021-08-26 DIAGNOSIS — M1611 Unilateral primary osteoarthritis, right hip: Secondary | ICD-10-CM | POA: Diagnosis not present

## 2021-09-18 DIAGNOSIS — I2510 Atherosclerotic heart disease of native coronary artery without angina pectoris: Secondary | ICD-10-CM | POA: Diagnosis not present

## 2021-09-18 DIAGNOSIS — E782 Mixed hyperlipidemia: Secondary | ICD-10-CM | POA: Diagnosis not present

## 2021-09-18 DIAGNOSIS — I482 Chronic atrial fibrillation, unspecified: Secondary | ICD-10-CM | POA: Diagnosis not present

## 2021-09-18 DIAGNOSIS — R0602 Shortness of breath: Secondary | ICD-10-CM | POA: Diagnosis not present

## 2021-09-18 DIAGNOSIS — I5022 Chronic systolic (congestive) heart failure: Secondary | ICD-10-CM | POA: Diagnosis not present

## 2021-09-18 DIAGNOSIS — I700 Atherosclerosis of aorta: Secondary | ICD-10-CM | POA: Diagnosis not present

## 2021-09-18 DIAGNOSIS — N1832 Chronic kidney disease, stage 3b: Secondary | ICD-10-CM | POA: Diagnosis not present

## 2021-09-18 DIAGNOSIS — I10 Essential (primary) hypertension: Secondary | ICD-10-CM | POA: Diagnosis not present

## 2021-09-25 ENCOUNTER — Other Ambulatory Visit: Payer: Self-pay | Admitting: Orthopedic Surgery

## 2021-10-03 ENCOUNTER — Encounter
Admission: RE | Admit: 2021-10-03 | Discharge: 2021-10-03 | Disposition: A | Discharge status: Home / Self Care | Payer: Medicare HMO | Source: Ambulatory Visit | Attending: Orthopedic Surgery | Admitting: Orthopedic Surgery

## 2021-10-03 ENCOUNTER — Other Ambulatory Visit: Payer: Self-pay

## 2021-10-03 VITALS — BP 129/69 | HR 92 | Resp 16 | Ht 66.0 in

## 2021-10-03 DIAGNOSIS — D696 Thrombocytopenia, unspecified: Secondary | ICD-10-CM | POA: Insufficient documentation

## 2021-10-03 DIAGNOSIS — Z01812 Encounter for preprocedural laboratory examination: Secondary | ICD-10-CM | POA: Insufficient documentation

## 2021-10-03 HISTORY — DX: Malignant (primary) neoplasm, unspecified: C80.1

## 2021-10-03 HISTORY — DX: Cardiac arrhythmia, unspecified: I49.9

## 2021-10-03 HISTORY — DX: Hypothyroidism, unspecified: E03.9

## 2021-10-03 HISTORY — DX: Unspecified osteoarthritis, unspecified site: M19.90

## 2021-10-03 HISTORY — DX: Dyspnea, unspecified: R06.00

## 2021-10-03 HISTORY — DX: Heart failure, unspecified: I50.9

## 2021-10-03 LAB — CBC WITH DIFFERENTIAL/PLATELET
Abs Immature Granulocytes: 0.02 10*3/uL (ref 0.00–0.07)
Basophils Absolute: 0.1 10*3/uL (ref 0.0–0.1)
Basophils Relative: 1 %
Eosinophils Absolute: 2 10*3/uL — ABNORMAL HIGH (ref 0.0–0.5)
Eosinophils Relative: 23 %
HCT: 43.3 % (ref 36.0–46.0)
Hemoglobin: 13.7 g/dL (ref 12.0–15.0)
Immature Granulocytes: 0 %
Lymphocytes Relative: 25 %
Lymphs Abs: 2.1 10*3/uL (ref 0.7–4.0)
MCH: 31 pg (ref 26.0–34.0)
MCHC: 31.6 g/dL (ref 30.0–36.0)
MCV: 98 fL (ref 80.0–100.0)
Monocytes Absolute: 0.5 10*3/uL (ref 0.1–1.0)
Monocytes Relative: 6 %
Neutro Abs: 3.9 10*3/uL (ref 1.7–7.7)
Neutrophils Relative %: 45 %
Platelets: 170 10*3/uL (ref 150–400)
RBC: 4.42 MIL/uL (ref 3.87–5.11)
RDW: 13.8 % (ref 11.5–15.5)
WBC: 8.5 10*3/uL (ref 4.0–10.5)
nRBC: 0 % (ref 0.0–0.2)

## 2021-10-03 LAB — COMPREHENSIVE METABOLIC PANEL
ALT: 12 U/L (ref 0–44)
AST: 23 U/L (ref 15–41)
Albumin: 4.3 g/dL (ref 3.5–5.0)
Alkaline Phosphatase: 55 U/L (ref 38–126)
Anion gap: 8 (ref 5–15)
BUN: 21 mg/dL (ref 8–23)
CO2: 30 mmol/L (ref 22–32)
Calcium: 10 mg/dL (ref 8.9–10.3)
Chloride: 103 mmol/L (ref 98–111)
Creatinine, Ser: 1.24 mg/dL — ABNORMAL HIGH (ref 0.44–1.00)
GFR, Estimated: 43 mL/min — ABNORMAL LOW (ref >60)
Glucose, Bld: 92 mg/dL (ref 70–99)
Potassium: 3.4 mmol/L — ABNORMAL LOW (ref 3.5–5.1)
Sodium: 141 mmol/L (ref 135–145)
Total Bilirubin: 1.1 mg/dL (ref 0.3–1.2)
Total Protein: 7.6 g/dL (ref 6.5–8.1)

## 2021-10-03 LAB — TYPE AND SCREEN
ABO/RH(D): A POS
Antibody Screen: NEGATIVE

## 2021-10-03 LAB — URINALYSIS, ROUTINE W REFLEX MICROSCOPIC
Bilirubin Urine: NEGATIVE
Glucose, UA: NEGATIVE mg/dL
Hgb urine dipstick: NEGATIVE
Ketones, ur: NEGATIVE mg/dL
Nitrite: NEGATIVE
Protein, ur: NEGATIVE mg/dL
Specific Gravity, Urine: 1.011 (ref 1.005–1.030)
pH: 6 (ref 5.0–8.0)

## 2021-10-03 LAB — SURGICAL PCR SCREEN
MRSA, PCR: NEGATIVE
Staphylococcus aureus: NEGATIVE

## 2021-10-03 NOTE — Patient Instructions (Addendum)
Your procedure is scheduled on: 10/17/21 - Thursday Report to the Registration Desk on the 1st floor of the [facility]. To find out your arrival time, please call [phone] between 1PM - 3PM on: 10/16/21 - Wednesday Report to the [facility] for Covid Test on 02/28/ 23 between 8 am and 12 noon.  REMEMBER: Instructions that are not followed completely may result in serious medical risk, up to and including death; or upon the discretion of your surgeon and anesthesiologist your surgery may need to be rescheduled.  Do not eat food after midnight the night before surgery.  No gum chewing, lozengers or hard candies.  You may however, drink CLEAR liquids up to 2 hours before you are scheduled to arrive for your surgery. Do not drink anything within 2 hours of your scheduled arrival time.  Clear liquids include: - water  - apple juice without pulp - gatorade (not RED, PURPLE, OR BLUE) - black coffee or tea (Do NOT add milk or creamers to the coffee or tea) Do NOT drink anything that is not on this list.  Type 1 and Type 2 diabetics should only drink water.  In addition, your doctor has ordered for you to drink the provided  Ensure Pre-Surgery Clear Carbohydrate Drink  Drinking this carbohydrate drink up to two hours before surgery helps to reduce insulin resistance and improve patient outcomes. Please complete drinking 2 hours prior to scheduled arrival time.  TAKE THESE MEDICATIONS THE MORNING OF SURGERY WITH A SIP OF WATER:  - levothyroxine (SYNTHROID) 88 MCG tablet - metoprolol succinate (TOPROL-XL) 100 MG 24 hr tablet  Xarelto -  has an appointment with Cardiology, will ask MD when she is to stop.  One week prior to surgery: Stop Anti-inflammatories (NSAIDS) such as Advil, Aleve, Ibuprofen, Motrin, Naproxen, Naprosyn and Aspirin based products such as Excedrin, Goodys Powder, BC Powder.  Stop ANY OVER THE COUNTER supplements until after surgery.  You may however,  continue to take Tylenol if needed for pain up until the day of surgery.  No Alcohol for 24 hours before or after surgery.  No Smoking including e-cigarettes for 24 hours prior to surgery.  No chewable tobacco products for at least 6 hours prior to surgery.  No nicotine patches on the day of surgery.  Do not use any "recreational" drugs for at least a week prior to your surgery.  Please be advised that the combination of cocaine and anesthesia may have negative outcomes, up to and including death. If you test positive for cocaine, your surgery will be cancelled.  On the morning of surgery brush your teeth with toothpaste and water, you may rinse your mouth with mouthwash if you wish. Do not swallow any toothpaste or mouthwash.  Use CHG Soap or wipes as directed on instruction sheet.  Do not wear jewelry, make-up, hairpins, clips or nail polish.  Do not wear lotions, powders, or perfumes.   Do not shave body from the neck down 48 hours prior to surgery just in case you cut yourself which could leave a site for infection.  Also, freshly shaved skin may become irritated if using the CHG soap.  Contact lenses, hearing aids and dentures may not be worn into surgery.  Do not bring valuables to the hospital. [facility] is not responsible for any missing/lost belongings or valuables.   Notify your doctor if there is any change in your medical condition (cold, fever, infection).  Wear comfortable clothing (specific to your surgery type)  to the hospital.  After surgery, you can help prevent lung complications by doing breathing exercises.  Take deep breaths and cough every 1-2 hours. Your doctor may order a device called an Incentive Spirometer to help you take deep breaths. When coughing or sneezing, hold a pillow firmly against your incision with both hands. This is called splinting. Doing this helps protect your incision. It also decreases belly discomfort.  If you are being admitted  to the hospital overnight, leave your suitcase in the car. After surgery it may be brought to your room.  If you are being discharged the day of surgery, you will not be allowed to drive home. You will need a responsible adult (18 years or older) to drive you home and stay with you that night.   If you are taking public transportation, you will need to have a responsible adult (18 years or older) with you. Please confirm with your physician that it is acceptable to use public transportation.   Please call the [place] [facility] at [phone] if you have any questions about these instructions.  Surgery Visitation Policy:  Patients undergoing a surgery or procedure may have one family member or support person with them as long as that person is not COVID-19 positive or experiencing its symptoms.  That person may remain in the waiting area during the procedure and may rotate out with other people.  Inpatient Visitation:    Visiting hours are 7 a.m. to 8 p.m. Up to two visitors ages 16+ are allowed at one time in a patient room. The visitors may rotate out with other people during the day. Visitors must check out when they leave, or other visitors will not be allowed. One designated support person may remain overnight. The visitor must pass COVID-19 screenings, use hand sanitizer when entering and exiting the patients room and wear a mask at all times, including in the patients room. Patients must also wear a mask when staff or their visitor are in the room. Masking is required regardless of vaccination status.

## 2021-10-07 ENCOUNTER — Encounter: Payer: Self-pay | Admitting: Orthopedic Surgery

## 2021-10-07 NOTE — Progress Notes (Signed)
Perioperative Services  Pre-Admission/Anesthesia Testing Clinical Review  Date: 10/16/21  Patient Demographics:  Name: Brittney Ramirez DOB:   [birth year] MRN:   [id number]  Planned Surgical Procedure(s):    Case: [id number] Date/Time: 10/17/21 1236   Procedure: TOTAL HIP ARTHROPLASTY ANTERIOR APPROACH (Right: Hip)   Anesthesia type: Choice   Pre-op diagnosis: Primary osteoarthritis of right hip M16.11   Location: [facility] OR ROOM 01 / [place] [facility]   Surgeons: Hessie Knows, MD   NOTE: Available PAT nursing documentation and vital signs have been reviewed. Clinical nursing staff has updated patient's PMH/PSHx, current medication list, and drug allergies/intolerances to ensure comprehensive history available to assist in medical decision making as it pertains to the aforementioned surgical procedure and anticipated anesthetic course. Extensive review of available clinical information performed. Brittney Ramirez PMH and PSHx updated with any diagnoses/procedures that  may have been inadvertently omitted during her intake with the pre-admission testing department's nursing staff.  Clinical Discussion:  Brittney Ramirez is a 81 y.o. female who is submitted for pre-surgical anesthesia review and clearance prior to her undergoing the above procedure. Patient is a Former Smoker (quit 01/1955). Pertinent PMH includes: coronary artery calcification seen on CT, atrial fibrillation, CHF, aortic atherosclerosis, TAA, HTN, HLD, hypothyroidism, CKD-III, DOE, pulmonary nodules, GERD (no daily Tx), OA, previous jaw fracture, dementia.  Patient is followed by cardiology Nehemiah Massed, MD). She was last seen in the cardiology clinic on 10/16/2021; notes reviewed.  At the time of her clinic visit, patient doing well overall from a cardiovascular perspective.  She denied any episodes of chest pain, however notes chronic exertional dyspnea that has been stable and at baseline.  She denied any PND, orthopnea,  palpitations, significant peripheral edema, vertiginous symptoms, or presyncope/syncope.  PMH significant for cardiovascular diagnoses.  CT of the abdomen and pelvis performed on 12/14/2015 revealed aneurysmal dilatation of thoracic aorta that measured 4.2 cm.  Of note, on a repeat CT performed on 11/28/2016, radiology did not mention the presence of the previously noted TAA.  Imaging independently reviewed by pulmonary medicine and vascular surgery and providers noted persistent stable 4.2-4.3 cm aneurysmal dilatation of the thoracic aorta.  Plans are to continue with serial surveillance to assess for enlargement.  TTE performed on 01/25/2020 revealed a reduced left ventricular systolic function with an EF of 45%.  There was global hypokinesis.  Left atrium moderately enlarged and right atrium mildly enlarged.  Right ventricle mildly enlarged.  There was trivial pulmonary, mild aortic, moderate to severe mitral and moderate tricuspid valve regurgitation.  There was no evidence of a significant transvalvular gradient to suggest mitral or aortic valve stenosis.  Myocardial perfusion imaging study performed on 01/25/2020 revealed a normal left ventricular systolic function with an EF of 61%.  There were no regional wall motion abnormalities.  There was no evidence of stress-induced myocardial ischemia or arrhythmia.  Study determined to be normal and low risk.  Patient with an atrial fibrillation diagnosis; CHA2DS2-VASc Score = 6 (age x 2, sex, CHF, HTN, aortic plaque).  Rate and rhythm maintained on oral metoprolol succinate daily.  She is chronically anticoagulated using daily dose of rivaroxaban; compliant with therapy with no evidence or reports of GI bleeding.  Blood pressure reasonably controlled at 132/76 on currently prescribed beta-blocker, ARB, and diuretic therapies.  Patient is on a statin for her HLD and further ASCVD prevention.  She is not diabetic. Functional capacity, as defined by DASI, is  documented as being </= 4 METS.  No  changes were made to her medication regimen.  Given patient's complaints of exertional shortness of breath in the setting of an upcoming elective surgical procedure, cardiology electing to repeat both her TTE and myocardial perfusion imaging study prior to issuing clearance.  Since being seen by cardiology, patient has undergone the recommended cardiovascular testing as follows:  TTE performed on 10/10/2021 revealed a low normal left ventricular systolic function with mild LVH; LVEF 50%.  Left atrium severely enlarged and right atrium mildly enlarged.  There was mild pulmonary regurgitation.  Additionally, there was moderate tricuspid and aortic valve regurgitation.  Parameters consistent with moderate to severe mitral valve regurgitation. There was no evidence of a significant transvalvular gradient to suggest aortic or mitral valve stenosis.  Myocardial perfusion imaging study performed on 10/10/2021 revealed a normal left ventricular systolic function with an EF of 61%.  There was normal regional wall motion.  There was no evidence of stress-induced myocardial ischemia.  Baseline EKG revealed atrial fibrillation, therefore study indeterminate.  Brittney Ramirez is scheduled for an elective RIGHT TOTAL HIP ARTHROPLASTY on 10/17/2021 with Dr. Hessie Knows, MD. Given patient's past medical history significant for cardiovascular diagnoses, presurgical cardiac clearance was sought by the PAT team.  "The patient is at the lowest risk possible for perioperative cardiovascular complications with the planned procedure.  The overall risk her procedure is low (<1%).  Currently has no evidence active and/or significant angina and/or congestive heart failure. Patient may proceed to surgery without restriction or need for further cardiovascular testing and an overall LOW risk". Again, this patient is on daily anticoagulation therapy.  She has been instructed on recommendations from her  cardiologist for holding her rivaroxaban for 3 days prior to her procedure with plans to restart as soon as postoperative bleeding risk to be minimized by her primary attending surgeon.  Patient is aware that her last dose of rivaroxaban should be on 10/13/2021.   Patient denies previous perioperative complications with anesthesia in the past. In review of the EMR, there are no records available for review regarding patient's past surgical/anesthetic courses within the [facility] system.  Vitals with BMI 10/03/2021 04/23/2021 04/23/2021  Height 5\' 6"  - -  Weight - - -  BMI - - -  Systolic [phone]  Diastolic 69 79 79  Pulse 92 91 91    Providers/Specialists:   NOTE: Primary physician provider listed below. Patient may have been seen by APP or partner within same practice.   PROVIDER ROLE / SPECIALTY LAST Fabio Bering, MD Orthopedics (Surgeon) 08/26/2021  Adin Hector, MD Primary Care Provider 04/24/2021  Serafina Royals, MD Cardiology 10/16/2021  Sharlet Salina, MD Physiatry 07/23/2021  Murlean Iba, MD Nephrology 07/15/2021   Allergies:  Morphine and related, Buprenorphine hcl, Codeine, Desipramine, and Fosamax [alendronate]  Current Home Medications:   No current facility-administered medications for this encounter.    acetaminophen (TYLENOL) 500 MG tablet   Apoaequorin (PREVAGEN) 10 MG CAPS   atorvastatin (LIPITOR) 40 MG tablet   donepezil (ARICEPT) 5 MG tablet   ferrous sulfate 325 (65 FE) MG EC tablet   latanoprost (XALATAN) 0.005 % ophthalmic solution   levothyroxine (SYNTHROID) 88 MCG tablet   metoprolol succinate (TOPROL-XL) 100 MG 24 hr tablet   valsartan-hydrochlorothiazide (DIOVAN-HCT) 160-25 MG tablet   vitamin B-12 (CYANOCOBALAMIN) 1000 MCG tablet   XARELTO 15 MG TABS tablet   erythromycin ophthalmic ointment   History:   Past Medical History:  Diagnosis Date   Aortic atherosclerosis (  [place])    Arthritis    Atrial fibrillation ([place])    a.)  CHA2DS2-VASc = 6 (age x 2, sex, CHF, HTN, aortic plaque). b.) rate/rhythm maintained on oral metoprolol succinate; chronically anticoagulated using dose reduced rivaroxaban d/t CKD.   Basal cell carcinoma    CHF (congestive heart failure) ([place]) 10/26/2014   a.)  TTE 10/26/2014: EF 45%; mild AR/PR; moderate MR/TR. b.) TTE 12/03/2016 mild LV dysfunction with LVH; EF 40%; global HK; severe LA and moderate RA enlargement; mild AR/PR, moderate MR/TR. c.)  TTE 01/25/2020: EF 45%; global HK; moderate LAE and mild RA enlargement; mild RV enlargement; trivial PR, mild AR, moderate to severe MR, moderate TR.   CKD (chronic kidney disease), stage III (HCC)    Coronary artery calcification seen on CT scan    Dementia (H[facility])    a.) on donepezil + apoaequorin   Diverticulosis    DOE (dyspnea on exertion)    Fatty infiltration of liver    Full dentures    GERD (gastroesophageal reflux disease)    Hyperlipidemia    Hypertension    Hypothyroidism    Jaw fracture ([place]) 02/1995   a.) BILATERAL subcondylar fractures   Long term current use of anticoagulant    a.) rivaroxaban; dose reduced d/t CKD   Miscarriage    OSA (obstructive sleep apnea)    a.) does not require nocturnal PAP therapy   Osteoporosis    Pulmonary nodules    Thoracic aortic aneurysm (TAA) 12/14/2015   a.) CT 12/14/2015: measured 4.2 cm. b.) CT 11/28/2016: not appreciated by radiologist, however CT reviewed by vascular and 4.2-4.3 cm aneurysmal dilitation noted.   Thyroid cyst    Past Surgical History:  Procedure Laterality Date   CATARACT EXTRACTION Right    CHOLECYSTECTOMY  2014   COLONOSCOPY     JOINT REPLACEMENT Bilateral 2003   knees   Family History  Problem Relation Age of Onset   Heart disease Mother    Hypertension Mother    Heart disease Father    Hypertension Father    Arthritis Sister    Cancer Sister    Diabetes Sister    Heart disease Sister    Breast cancer Sister 89   Diabetes Brother    Heart disease  Brother    Hypertension Brother    Social History   Tobacco Use   Smoking status: Former    Types: Cigarettes    Start date: 02/07/1955    Quit date: 02/08/1955    Years since quitting: 66.7   Smokeless tobacco: Never  Vaping Use   Vaping Use: Never used  Substance Use Topics   Alcohol use: No    Alcohol/week: 0.0 standard drinks   Drug use: No    Pertinent Clinical Results:  LABS: Labs reviewed: Acceptable for surgery.  No visits with results within 3 Day(s) from this visit.  Latest known visit with results is:  Hospital Outpatient Visit on 10/03/2021  Component Date Value Ref Range Status   MRSA, PCR 10/03/2021 NEGATIVE  NEGATIVE Final   Staphylococcus aureus 10/03/2021 NEGATIVE  NEGATIVE Final   Comment: (NOTE) The Xpert SA Assay (FDA approved for NASAL specimens in patients 22 years of age and older), is one component of a comprehensive surveillance program. It is not intended to diagnose infection nor to guide or monitor treatment. Performed at [facility], [place], [place] [address]    WBC 10/03/2021 8.5  4.0 - 10.5 K/uL Final   RBC  10/03/2021 4.42  3.87 - 5.11 MIL/uL Final   Hemoglobin 10/03/2021 13.7  12.0 - 15.0 g/dL Final   HCT 10/03/2021 43.3  36.0 - 46.0 % Final   MCV 10/03/2021 98.0  80.0 - 100.0 fL Final   MCH 10/03/2021 31.0  26.0 - 34.0 pg Final   MCHC 10/03/2021 31.6  30.0 - 36.0 g/dL Final   RDW 10/03/2021 13.8  11.5 - 15.5 % Final   Platelets 10/03/2021 170  150 - 400 K/uL Final   nRBC 10/03/2021 0.0  0.0 - 0.2 % Final   Neutrophils Relative % 10/03/2021 45  % Final   Neutro Abs 10/03/2021 3.9  1.7 - 7.7 K/uL Final   Lymphocytes Relative 10/03/2021 25  % Final   Lymphs Abs 10/03/2021 2.1  0.7 - 4.0 K/uL Final   Monocytes Relative 10/03/2021 6  % Final   Monocytes Absolute 10/03/2021 0.5  0.1 - 1.0 K/uL Final   Eosinophils Relative 10/03/2021 23  % Final   Eosinophils Absolute 10/03/2021 2.0 (H)  0.0 - 0.5 K/uL Final    Basophils Relative 10/03/2021 1  % Final   Basophils Absolute 10/03/2021 0.1  0.0 - 0.1 K/uL Final   Immature Granulocytes 10/03/2021 0  % Final   Abs Immature Granulocytes 10/03/2021 0.02  0.00 - 0.07 K/uL Final   Performed at [facility], [place], [place] [address]   Sodium 10/03/2021 141  135 - 145 mmol/L Final   Potassium 10/03/2021 3.4 (L)  3.5 - 5.1 mmol/L Final   Chloride 10/03/2021 103  98 - 111 mmol/L Final   CO2 10/03/2021 30  22 - 32 mmol/L Final   Glucose, Bld 10/03/2021 92  70 - 99 mg/dL Final   Glucose reference range applies only to samples taken after fasting for at least 8 hours.   BUN 10/03/2021 21  8 - 23 mg/dL Final   Creatinine, Ser 10/03/2021 1.24 (H)  0.44 - 1.00 mg/dL Final   Calcium 10/03/2021 10.0  8.9 - 10.3 mg/dL Final   Total Protein 10/03/2021 7.6  6.5 - 8.1 g/dL Final   Albumin 10/03/2021 4.3  3.5 - 5.0 g/dL Final   AST 10/03/2021 23  15 - 41 U/L Final   ALT 10/03/2021 12  0 - 44 U/L Final   Alkaline Phosphatase 10/03/2021 55  38 - 126 U/L Final   Total Bilirubin 10/03/2021 1.1  0.3 - 1.2 mg/dL Final   GFR, Estimated 10/03/2021 43 (L)  >60 mL/min Final   Comment: (NOTE) Calculated using the CKD-EPI Creatinine Equation (2021)    Anion gap 10/03/2021 8  5 - 15 Final   Performed at [facility], [place], [place] [address]   Color, Urine 10/03/2021 YELLOW (A)  YELLOW Final   APPearance 10/03/2021 CLEAR (A)  CLEAR Final   Specific Gravity, Urine 10/03/2021 1.011  1.005 - 1.030 Final   pH 10/03/2021 6.0  5.0 - 8.0 Final   Glucose, UA 10/03/2021 NEGATIVE  NEGATIVE mg/dL Final   Hgb urine dipstick 10/03/2021 NEGATIVE  NEGATIVE Final   Bilirubin Urine 10/03/2021 NEGATIVE  NEGATIVE Final   Ketones, ur 10/03/2021 NEGATIVE  NEGATIVE mg/dL Final   Protein, ur 10/03/2021 NEGATIVE  NEGATIVE mg/dL Final   Nitrite 10/03/2021 NEGATIVE  NEGATIVE Final   Leukocytes,Ua 10/03/2021 SMALL (A)  NEGATIVE Final   RBC / HPF  10/03/2021 0-5  0 - 5 RBC/hpf Final   WBC, UA 10/03/2021 0-5  0 - 5 WBC/hpf Final  Bacteria, UA 10/03/2021 RARE (A)  NONE SEEN Final   Squamous Epithelial / LPF 10/03/2021 0-5  0 - 5 Final   Mucus 10/03/2021 PRESENT   Final   Performed at [facility], [address] [place], [place] [address]   ABO/RH(D) 10/03/2021 A POS   Final   Antibody Screen 10/03/2021 NEG   Final   Sample Expiration 10/03/2021 10/17/2021,2359   Final   Extend sample reason 10/03/2021    Final                   Value:NO TRANSFUSIONS OR PREGNANCY IN THE PAST 3 MONTHS Performed at [facility], [address] [place],  [address]     ECG: Date: 09/18/2021 Rate: 83 bpm Rhythm: atrial fibrillation Axis (leads I and aVF): Normal Intervals: QRS 84 ms. QTc 420 ms. ST segment and T wave changes: No evidence of acute ST segment elevation or depression.  Evidence of an age undetermined anteroseptal infarct present. Comparison: Similar to previous tracing obtained on 08/08/2020 NOTE: Tracing obtained at [facility]; unable for review. Above based on cardiologist's interpretation.    IMAGING / PROCEDURES: MYOCARDIAL PERFUSION IMAGING STUDY (LEXISCAN) performed on 10/10/2021 LVEF 61% Normal myocardial thickening and wall motion No artifact Left ventricular cavity size normal Baseline ECG revealed atrial fibrillation; chronic No evidence of stress-induced myocardial ischemia Study indeterminate due to baseline ECG changes  TRANSTHORACIC ECHOCARDIOGRAM performed on 10/10/2021 LVEF 50% Low normal left ventricular systolic function with mild LVH Normal right ventricular systolic function Severe left atrial enlargement Moderate right atrial enlargement Mild PR Moderate AR and TR Moderate to severe MR No valvular stenosis No pericardial effusion  DIAGNOSTIC RADIOGRAPHS OF RIGHT HIP 2 OR 3 VIEWS WITH OR WITHOUT PELVIS performed on 08/20/2021 Severe degenerative changes of the RIGHT  hip joint with complete loss of joint space in the superior acetabulum with severe sclerotic changes and subchondral cyst formation noted along the femoral head. There is inferior acetabular spurring with mild central joint space narrowing. No evidence of acute bony abnormality or abnormal bony lesions.  Impression and Plan:  AVARAE ZWART has been referred for pre-anesthesia review and clearance prior to her undergoing the planned anesthetic and procedural courses. Available labs, pertinent testing, and imaging results were personally reviewed by me. This patient has been appropriately cleared by cardiology with an overall LOW risk of significant perioperative cardiovascular complications.  Based on clinical review performed today (10/16/21), barring any significant acute changes in the patient's overall condition, it is anticipated that she will be able to proceed with the planned surgical intervention. Any acute changes in clinical condition may necessitate her procedure being postponed and/or cancelled. Patient will meet with anesthesia team (MD and/or CRNA) on the day of her procedure for preoperative evaluation/assessment. Questions regarding anesthetic course will be fielded at that time.   Pre-surgical instructions were reviewed with the patient during her PAT appointment and questions were fielded by PAT clinical staff. Patient was advised that if any questions or concerns arise prior to her procedure then she should return a call to PAT and/or her surgeon's office to discuss.  Honor Loh, MSN, APRN, FNP-C, CEN [facility]  Peri-operative Services Nurse Practitioner Phone: [phone] Fax: [phone] 10/16/21 12:02 PM  NOTE: This note has been prepared using Dragon dictation software. Despite my best ability to proofread, there is always the potential that unintentional transcriptional errors may still occur from this process.

## 2021-10-08 ENCOUNTER — Encounter: Payer: Self-pay | Admitting: Orthopedic Surgery

## 2021-10-10 DIAGNOSIS — I5022 Chronic systolic (congestive) heart failure: Secondary | ICD-10-CM | POA: Diagnosis not present

## 2021-10-10 DIAGNOSIS — I2510 Atherosclerotic heart disease of native coronary artery without angina pectoris: Secondary | ICD-10-CM | POA: Diagnosis not present

## 2021-10-10 DIAGNOSIS — I482 Chronic atrial fibrillation, unspecified: Secondary | ICD-10-CM | POA: Diagnosis not present

## 2021-10-15 ENCOUNTER — Other Ambulatory Visit
Admission: RE | Admit: 2021-10-15 | Discharge: 2021-10-15 | Disposition: A | Discharge status: Home / Self Care | Payer: Medicare HMO | Source: Ambulatory Visit | Attending: Orthopedic Surgery | Admitting: Orthopedic Surgery

## 2021-10-15 ENCOUNTER — Other Ambulatory Visit: Payer: Self-pay

## 2021-10-15 DIAGNOSIS — Z01812 Encounter for preprocedural laboratory examination: Secondary | ICD-10-CM | POA: Insufficient documentation

## 2021-10-15 DIAGNOSIS — Z20822 Contact with and (suspected) exposure to covid-19: Secondary | ICD-10-CM | POA: Diagnosis not present

## 2021-10-15 LAB — SARS CORONAVIRUS 2 (TAT 6-24 HRS): SARS Coronavirus 2: NEGATIVE

## 2021-10-16 DIAGNOSIS — E782 Mixed hyperlipidemia: Secondary | ICD-10-CM | POA: Diagnosis not present

## 2021-10-16 DIAGNOSIS — I2510 Atherosclerotic heart disease of native coronary artery without angina pectoris: Secondary | ICD-10-CM | POA: Diagnosis not present

## 2021-10-16 DIAGNOSIS — N1832 Chronic kidney disease, stage 3b: Secondary | ICD-10-CM | POA: Diagnosis not present

## 2021-10-16 DIAGNOSIS — I340 Nonrheumatic mitral (valve) insufficiency: Secondary | ICD-10-CM | POA: Diagnosis not present

## 2021-10-16 DIAGNOSIS — I5022 Chronic systolic (congestive) heart failure: Secondary | ICD-10-CM | POA: Diagnosis not present

## 2021-10-16 MED ORDER — FAMOTIDINE 20 MG PO TABS: 20.0000 mg | ORAL_TABLET | Freq: Once | ORAL | Status: AC

## 2021-10-16 MED ORDER — ORAL CARE MOUTH RINSE: 15.0000 mL | Freq: Once | OROMUCOSAL | Status: AC

## 2021-10-16 MED ORDER — CEFAZOLIN SODIUM-DEXTROSE 2-4 GM/100ML-% IV SOLN
2.0000 g | INTRAVENOUS | Status: AC
  Administered 2021-10-17: 2 g via INTRAVENOUS

## 2021-10-16 MED ORDER — CHLORHEXIDINE GLUCONATE 0.12 % MT SOLN: 15.0000 mL | Freq: Once | OROMUCOSAL | Status: AC

## 2021-10-16 MED ORDER — LACTATED RINGERS IV SOLN: INTRAVENOUS | Status: DC

## 2021-10-17 ENCOUNTER — Observation Stay
Admission: RE | Admit: 2021-10-17 | Discharge: 2021-10-19 | Disposition: A | Discharge status: Home Health | Payer: Medicare HMO | Attending: Orthopedic Surgery | Admitting: Orthopedic Surgery

## 2021-10-17 ENCOUNTER — Encounter: Payer: Self-pay | Admitting: Orthopedic Surgery

## 2021-10-17 ENCOUNTER — Other Ambulatory Visit: Payer: Self-pay

## 2021-10-17 ENCOUNTER — Ambulatory Visit: Payer: Medicare HMO

## 2021-10-17 ENCOUNTER — Encounter
Admission: RE | Disposition: A | Discharge status: Home Health | Payer: Self-pay | Source: Home / Self Care | Attending: Orthopedic Surgery

## 2021-10-17 ENCOUNTER — Ambulatory Visit: Payer: Medicare HMO | Admitting: Urgent Care

## 2021-10-17 ENCOUNTER — Observation Stay: Payer: Medicare HMO

## 2021-10-17 DIAGNOSIS — I130 Hypertensive heart and chronic kidney disease with heart failure and stage 1 through stage 4 chronic kidney disease, or unspecified chronic kidney disease: Secondary | ICD-10-CM | POA: Insufficient documentation

## 2021-10-17 DIAGNOSIS — F0390 Unspecified dementia without behavioral disturbance: Secondary | ICD-10-CM | POA: Insufficient documentation

## 2021-10-17 DIAGNOSIS — I5022 Chronic systolic (congestive) heart failure: Secondary | ICD-10-CM | POA: Diagnosis not present

## 2021-10-17 DIAGNOSIS — N183 Chronic kidney disease, stage 3 unspecified: Secondary | ICD-10-CM | POA: Insufficient documentation

## 2021-10-17 DIAGNOSIS — Z96641 Presence of right artificial hip joint: Secondary | ICD-10-CM

## 2021-10-17 DIAGNOSIS — I2510 Atherosclerotic heart disease of native coronary artery without angina pectoris: Secondary | ICD-10-CM | POA: Diagnosis not present

## 2021-10-17 DIAGNOSIS — E039 Hypothyroidism, unspecified: Secondary | ICD-10-CM | POA: Insufficient documentation

## 2021-10-17 DIAGNOSIS — Z96653 Presence of artificial knee joint, bilateral: Secondary | ICD-10-CM | POA: Insufficient documentation

## 2021-10-17 DIAGNOSIS — Z7901 Long term (current) use of anticoagulants: Secondary | ICD-10-CM | POA: Insufficient documentation

## 2021-10-17 DIAGNOSIS — G4733 Obstructive sleep apnea (adult) (pediatric): Secondary | ICD-10-CM | POA: Diagnosis not present

## 2021-10-17 DIAGNOSIS — Z419 Encounter for procedure for purposes other than remedying health state, unspecified: Secondary | ICD-10-CM

## 2021-10-17 DIAGNOSIS — Z471 Aftercare following joint replacement surgery: Secondary | ICD-10-CM | POA: Diagnosis not present

## 2021-10-17 DIAGNOSIS — M1611 Unilateral primary osteoarthritis, right hip: Principal | ICD-10-CM | POA: Insufficient documentation

## 2021-10-17 DIAGNOSIS — G8918 Other acute postprocedural pain: Secondary | ICD-10-CM

## 2021-10-17 DIAGNOSIS — I4891 Unspecified atrial fibrillation: Secondary | ICD-10-CM | POA: Insufficient documentation

## 2021-10-17 HISTORY — DX: Fatty (change of) liver, not elsewhere classified: K76.0

## 2021-10-17 HISTORY — DX: Presence of dental prosthetic device (complete) (partial): Z97.2

## 2021-10-17 HISTORY — DX: Diverticulosis of intestine, part unspecified, without perforation or abscess without bleeding: K57.90

## 2021-10-17 HISTORY — DX: Complete loss of teeth, unspecified cause, unspecified class: K08.109

## 2021-10-17 HISTORY — DX: Atherosclerotic heart disease of native coronary artery without angina pectoris: I25.10

## 2021-10-17 HISTORY — DX: Obstructive sleep apnea (adult) (pediatric): G47.33

## 2021-10-17 HISTORY — DX: Gastro-esophageal reflux disease without esophagitis: K21.9

## 2021-10-17 HISTORY — DX: Basal cell carcinoma of skin, unspecified: C44.91

## 2021-10-17 HISTORY — DX: Chronic kidney disease, stage 3 unspecified: N18.30

## 2021-10-17 HISTORY — DX: Other forms of dyspnea: R06.09

## 2021-10-17 HISTORY — DX: Other nonspecific abnormal finding of lung field: R91.8

## 2021-10-17 HISTORY — DX: Atherosclerosis of aorta: I70.0

## 2021-10-17 HISTORY — DX: Long term (current) use of anticoagulants: Z79.01

## 2021-10-17 HISTORY — PX: TOTAL HIP ARTHROPLASTY: SHX124

## 2021-10-17 HISTORY — DX: Unspecified dementia, unspecified severity, without behavioral disturbance, psychotic disturbance, mood disturbance, and anxiety: F03.90

## 2021-10-17 LAB — ABO/RH: ABO/RH(D): A POS

## 2021-10-17 SURGERY — ARTHROPLASTY, HIP, TOTAL, ANTERIOR APPROACH
Anesthesia: General | Site: Hip | Laterality: Right

## 2021-10-17 MED ORDER — CEFAZOLIN SODIUM-DEXTROSE 2-4 GM/100ML-% IV SOLN
INTRAVENOUS | Status: AC
  Filled 2021-10-17: qty 100

## 2021-10-17 MED ORDER — ONDANSETRON HCL 4 MG PO TABS: 4.0000 mg | ORAL_TABLET | Freq: Four times a day (QID) | ORAL | Status: DC | PRN

## 2021-10-17 MED ORDER — SODIUM CHLORIDE 0.9 % IV SOLN: INTRAVENOUS | Status: DC

## 2021-10-17 MED ORDER — METOPROLOL SUCCINATE ER 50 MG PO TB24
100.0000 mg | ORAL_TABLET | Freq: Every day | ORAL | Status: DC
  Administered 2021-10-18 – 2021-10-19 (×2): 100 mg via ORAL
  Filled 2021-10-17: qty 2
  Filled 2021-10-17: qty 4

## 2021-10-17 MED ORDER — MORPHINE SULFATE (PF) 4 MG/ML IV SOLN
0.5000 mg | INTRAVENOUS | Status: DC | PRN
  Administered 2021-10-17: 1 mg via INTRAVENOUS

## 2021-10-17 MED ORDER — IRBESARTAN 150 MG PO TABS
150.0000 mg | ORAL_TABLET | Freq: Every day | ORAL | Status: DC
  Administered 2021-10-18: 150 mg via ORAL
  Filled 2021-10-17: qty 1

## 2021-10-17 MED ORDER — PHENYLEPHRINE HCL (PRESSORS) 10 MG/ML IV SOLN
INTRAVENOUS | Status: DC | PRN
  Administered 2021-10-17: 120 ug via INTRAVENOUS

## 2021-10-17 MED ORDER — ONDANSETRON HCL 4 MG/2ML IJ SOLN
4.0000 mg | Freq: Four times a day (QID) | INTRAMUSCULAR | Status: DC | PRN
  Administered 2021-10-17 (×2): 4 mg via INTRAVENOUS

## 2021-10-17 MED ORDER — SODIUM CHLORIDE 0.9 % IV SOLN
INTRAVENOUS | Status: AC | PRN
Start: 2021-10-17 — End: 2021-10-17
  Administered 2021-10-17: 250 mL

## 2021-10-17 MED ORDER — VITAMIN B-12 1000 MCG PO TABS
1000.0000 ug | ORAL_TABLET | Freq: Every day | ORAL | Status: DC
  Administered 2021-10-18 – 2021-10-19 (×2): 1000 ug via ORAL
  Filled 2021-10-17 (×2): qty 1

## 2021-10-17 MED ORDER — LIDOCAINE HCL (PF) 2 % IJ SOLN
INTRAMUSCULAR | Status: AC
  Filled 2021-10-17: qty 5

## 2021-10-17 MED ORDER — ONDANSETRON HCL 4 MG/2ML IJ SOLN
INTRAMUSCULAR | Status: AC
  Filled 2021-10-17: qty 2

## 2021-10-17 MED ORDER — SODIUM CHLORIDE (PF) 0.9 % IJ SOLN
INTRAMUSCULAR | Status: DC | PRN
  Administered 2021-10-17: 90 mL

## 2021-10-17 MED ORDER — DOCUSATE SODIUM 100 MG PO CAPS
100.0000 mg | ORAL_CAPSULE | Freq: Two times a day (BID) | ORAL | Status: DC
  Administered 2021-10-18 – 2021-10-19 (×2): 100 mg via ORAL
  Filled 2021-10-17 (×2): qty 1

## 2021-10-17 MED ORDER — CHLORHEXIDINE GLUCONATE 0.12 % MT SOLN
OROMUCOSAL | Status: AC
  Administered 2021-10-17: 15 mL via OROMUCOSAL
  Filled 2021-10-17: qty 15

## 2021-10-17 MED ORDER — CEFAZOLIN SODIUM-DEXTROSE 2-4 GM/100ML-% IV SOLN
INTRAVENOUS | Status: AC
  Administered 2021-10-17: 2 g via INTRAVENOUS
  Filled 2021-10-17: qty 100

## 2021-10-17 MED ORDER — ALUM & MAG HYDROXIDE-SIMETH 200-200-20 MG/5ML PO SUSP: 30.0000 mL | ORAL | Status: DC | PRN

## 2021-10-17 MED ORDER — HEMOSTATIC AGENTS (NO CHARGE) OPTIME
TOPICAL | Status: DC | PRN
  Administered 2021-10-17: 2 via TOPICAL

## 2021-10-17 MED ORDER — MIDAZOLAM HCL 2 MG/2ML IJ SOLN
INTRAMUSCULAR | Status: AC
  Filled 2021-10-17: qty 2

## 2021-10-17 MED ORDER — ROCURONIUM BROMIDE 100 MG/10ML IV SOLN
INTRAVENOUS | Status: DC | PRN
  Administered 2021-10-17: 50 mg via INTRAVENOUS

## 2021-10-17 MED ORDER — PANTOPRAZOLE SODIUM 40 MG PO TBEC
40.0000 mg | DELAYED_RELEASE_TABLET | Freq: Every day | ORAL | Status: DC
  Administered 2021-10-17 – 2021-10-19 (×2): 40 mg via ORAL
  Filled 2021-10-17: qty 1

## 2021-10-17 MED ORDER — BUPIVACAINE-EPINEPHRINE (PF) 0.25% -1:200000 IJ SOLN
INTRAMUSCULAR | Status: AC
  Filled 2021-10-17: qty 30

## 2021-10-17 MED ORDER — DOCUSATE SODIUM 100 MG PO CAPS
ORAL_CAPSULE | ORAL | Status: AC
  Administered 2021-10-17: 100 mg via ORAL
  Filled 2021-10-17: qty 1

## 2021-10-17 MED ORDER — PHENYLEPHRINE HCL-NACL 20-0.9 MG/250ML-% IV SOLN
INTRAVENOUS | Status: DC | PRN
  Administered 2021-10-17: 30 ug/min via INTRAVENOUS

## 2021-10-17 MED ORDER — VALSARTAN-HYDROCHLOROTHIAZIDE 160-25 MG PO TABS: 1.0000 | ORAL_TABLET | Freq: Every day | ORAL | Status: DC

## 2021-10-17 MED ORDER — FLEET ENEMA 7-19 GM/118ML RE ENEM: 1.0000 | ENEMA | Freq: Once | RECTAL | Status: DC | PRN

## 2021-10-17 MED ORDER — LEVOTHYROXINE SODIUM 88 MCG PO TABS
88.0000 ug | ORAL_TABLET | Freq: Every day | ORAL | Status: DC
  Administered 2021-10-18 – 2021-10-19 (×2): 88 ug via ORAL
  Filled 2021-10-17 (×3): qty 1

## 2021-10-17 MED ORDER — FENTANYL CITRATE (PF) 100 MCG/2ML IJ SOLN
25.0000 ug | INTRAMUSCULAR | Status: DC | PRN
  Administered 2021-10-17: 25 ug via INTRAVENOUS

## 2021-10-17 MED ORDER — DONEPEZIL HCL 5 MG PO TABS
5.0000 mg | ORAL_TABLET | Freq: Every day | ORAL | Status: DC
  Administered 2021-10-17 – 2021-10-18 (×2): 5 mg via ORAL
  Filled 2021-10-17 (×3): qty 1

## 2021-10-17 MED ORDER — METHOCARBAMOL 500 MG PO TABS: 500.0000 mg | ORAL_TABLET | Freq: Four times a day (QID) | ORAL | Status: DC | PRN

## 2021-10-17 MED ORDER — SODIUM CHLORIDE FLUSH 0.9 % IV SOLN
INTRAVENOUS | Status: AC
  Filled 2021-10-17: qty 40

## 2021-10-17 MED ORDER — HYDROCODONE-ACETAMINOPHEN 7.5-325 MG PO TABS: 1.0000 | ORAL_TABLET | ORAL | Status: DC | PRN

## 2021-10-17 MED ORDER — POLYETHYLENE GLYCOL 3350 17 G PO PACK: 17.0000 g | PACK | Freq: Every day | ORAL | Status: DC | PRN

## 2021-10-17 MED ORDER — FENTANYL CITRATE (PF) 100 MCG/2ML IJ SOLN
INTRAMUSCULAR | Status: DC | PRN
  Administered 2021-10-17 (×2): 50 ug via INTRAVENOUS

## 2021-10-17 MED ORDER — METHOCARBAMOL 1000 MG/10ML IJ SOLN
500.0000 mg | Freq: Four times a day (QID) | INTRAVENOUS | Status: DC | PRN
  Filled 2021-10-17: qty 5

## 2021-10-17 MED ORDER — PROPOFOL 1000 MG/100ML IV EMUL
INTRAVENOUS | Status: AC
  Filled 2021-10-17: qty 100

## 2021-10-17 MED ORDER — ONDANSETRON HCL 4 MG/2ML IJ SOLN
INTRAMUSCULAR | Status: DC | PRN
Start: 2021-10-17 — End: 2021-10-17
  Administered 2021-10-17: 4 mg via INTRAVENOUS

## 2021-10-17 MED ORDER — LATANOPROST 0.005 % OP SOLN
1.0000 [drp] | Freq: Every day | OPHTHALMIC | Status: DC
  Administered 2021-10-17 – 2021-10-18 (×2): 1 [drp] via OPHTHALMIC
  Filled 2021-10-17: qty 2.5

## 2021-10-17 MED ORDER — SODIUM CHLORIDE 0.9 % IR SOLN
Status: DC | PRN
  Administered 2021-10-17: 1004 mL

## 2021-10-17 MED ORDER — NEOMYCIN-POLYMYXIN B GU 40-200000 IR SOLN
Status: AC
  Filled 2021-10-17: qty 20

## 2021-10-17 MED ORDER — BUPIVACAINE LIPOSOME 1.3 % IJ SUSP
INTRAMUSCULAR | Status: AC
  Filled 2021-10-17: qty 20

## 2021-10-17 MED ORDER — SUGAMMADEX SODIUM 200 MG/2ML IV SOLN
INTRAVENOUS | Status: DC | PRN
Start: 2021-10-17 — End: 2021-10-17
  Administered 2021-10-17: 200 mg via INTRAVENOUS

## 2021-10-17 MED ORDER — ROCURONIUM BROMIDE 10 MG/ML (PF) SYRINGE
PREFILLED_SYRINGE | INTRAVENOUS | Status: AC
  Filled 2021-10-17: qty 10

## 2021-10-17 MED ORDER — DIPHENHYDRAMINE HCL 12.5 MG/5ML PO ELIX: 12.5000 mg | ORAL_SOLUTION | ORAL | Status: DC | PRN

## 2021-10-17 MED ORDER — ERYTHROMYCIN 5 MG/GM OP OINT
1.0000 "application " | TOPICAL_OINTMENT | Freq: Two times a day (BID) | OPHTHALMIC | Status: DC
  Administered 2021-10-17 – 2021-10-19 (×5): 1 via OPHTHALMIC
  Filled 2021-10-17: qty 1

## 2021-10-17 MED ORDER — PHENYLEPHRINE HCL-NACL 20-0.9 MG/250ML-% IV SOLN
INTRAVENOUS | Status: AC
  Filled 2021-10-17: qty 250

## 2021-10-17 MED ORDER — HYDROCODONE-ACETAMINOPHEN 5-325 MG PO TABS
1.0000 | ORAL_TABLET | ORAL | Status: DC | PRN
  Administered 2021-10-18: 1 via ORAL

## 2021-10-17 MED ORDER — MORPHINE SULFATE (PF) 2 MG/ML IV SOLN
INTRAVENOUS | Status: AC
  Filled 2021-10-17: qty 1

## 2021-10-17 MED ORDER — HYDROMORPHONE HCL 1 MG/ML IJ SOLN
INTRAMUSCULAR | Status: DC | PRN
  Administered 2021-10-17: .2 mg via INTRAVENOUS

## 2021-10-17 MED ORDER — SODIUM CHLORIDE 0.9 % IV BOLUS
1000.0000 mL | Freq: Once | INTRAVENOUS | Status: AC
  Administered 2021-10-17: 1000 mL via INTRAVENOUS

## 2021-10-17 MED ORDER — PHENYLEPHRINE HCL (PRESSORS) 10 MG/ML IV SOLN
INTRAVENOUS | Status: AC
  Filled 2021-10-17: qty 1

## 2021-10-17 MED ORDER — APOAEQUORIN 10 MG PO CAPS: 10.0000 mg | ORAL_CAPSULE | Freq: Every day | ORAL | Status: DC

## 2021-10-17 MED ORDER — PROPOFOL 10 MG/ML IV BOLUS
INTRAVENOUS | Status: DC | PRN
  Administered 2021-10-17: 110 mg via INTRAVENOUS

## 2021-10-17 MED ORDER — FAMOTIDINE 20 MG PO TABS
ORAL_TABLET | ORAL | Status: AC
  Administered 2021-10-17: 20 mg via ORAL
  Filled 2021-10-17: qty 1

## 2021-10-17 MED ORDER — LIDOCAINE HCL (CARDIAC) PF 100 MG/5ML IV SOSY
PREFILLED_SYRINGE | INTRAVENOUS | Status: DC | PRN
  Administered 2021-10-17: 100 mg via INTRAVENOUS

## 2021-10-17 MED ORDER — PROPOFOL 10 MG/ML IV BOLUS
INTRAVENOUS | Status: AC
  Filled 2021-10-17: qty 20

## 2021-10-17 MED ORDER — ACETAMINOPHEN 325 MG PO TABS
325.0000 mg | ORAL_TABLET | Freq: Four times a day (QID) | ORAL | Status: DC | PRN
  Administered 2021-10-19: 650 mg via ORAL
  Filled 2021-10-17: qty 2

## 2021-10-17 MED ORDER — HYDROCHLOROTHIAZIDE 25 MG PO TABS
25.0000 mg | ORAL_TABLET | Freq: Every day | ORAL | Status: DC
  Filled 2021-10-17: qty 1

## 2021-10-17 MED ORDER — RIVAROXABAN 15 MG PO TABS
15.0000 mg | ORAL_TABLET | Freq: Every day | ORAL | Status: DC
  Administered 2021-10-18 – 2021-10-19 (×2): 15 mg via ORAL
  Filled 2021-10-17 (×2): qty 1

## 2021-10-17 MED ORDER — ACETAMINOPHEN 10 MG/ML IV SOLN
INTRAVENOUS | Status: DC | PRN
Start: 2021-10-17 — End: 2021-10-17
  Administered 2021-10-17: 1000 mg via INTRAVENOUS

## 2021-10-17 MED ORDER — ACETAMINOPHEN 10 MG/ML IV SOLN
INTRAVENOUS | Status: AC
  Filled 2021-10-17: qty 100

## 2021-10-17 MED ORDER — PANTOPRAZOLE SODIUM 40 MG PO TBEC
DELAYED_RELEASE_TABLET | ORAL | Status: AC
  Filled 2021-10-17: qty 1

## 2021-10-17 MED ORDER — ZOLPIDEM TARTRATE 5 MG PO TABS: 5.0000 mg | ORAL_TABLET | Freq: Every evening | ORAL | Status: DC | PRN

## 2021-10-17 MED ORDER — CEFAZOLIN SODIUM-DEXTROSE 2-4 GM/100ML-% IV SOLN
2.0000 g | Freq: Four times a day (QID) | INTRAVENOUS | Status: AC
  Administered 2021-10-17: 2 g via INTRAVENOUS

## 2021-10-17 MED ORDER — PHENOL 1.4 % MT LIQD
1.0000 | OROMUCOSAL | Status: DC | PRN
  Filled 2021-10-17: qty 177

## 2021-10-17 MED ORDER — BISACODYL 5 MG PO TBEC
5.0000 mg | DELAYED_RELEASE_TABLET | Freq: Every day | ORAL | Status: DC | PRN
  Filled 2021-10-17: qty 1

## 2021-10-17 MED ORDER — MENTHOL 3 MG MT LOZG
1.0000 | LOZENGE | OROMUCOSAL | Status: DC | PRN
  Filled 2021-10-17: qty 9

## 2021-10-17 MED ORDER — HYDROMORPHONE HCL 1 MG/ML IJ SOLN
INTRAMUSCULAR | Status: AC
  Filled 2021-10-17: qty 1

## 2021-10-17 MED ORDER — FENTANYL CITRATE (PF) 100 MCG/2ML IJ SOLN
INTRAMUSCULAR | Status: AC
  Administered 2021-10-17: 25 ug via INTRAVENOUS
  Filled 2021-10-17: qty 2

## 2021-10-17 MED ORDER — FENTANYL CITRATE (PF) 100 MCG/2ML IJ SOLN
INTRAMUSCULAR | Status: AC
  Filled 2021-10-17: qty 2

## 2021-10-17 MED ORDER — ATORVASTATIN CALCIUM 20 MG PO TABS
40.0000 mg | ORAL_TABLET | Freq: Every day | ORAL | Status: DC
  Administered 2021-10-17 – 2021-10-18 (×2): 40 mg via ORAL
  Filled 2021-10-17 (×3): qty 2

## 2021-10-17 SURGICAL SUPPLY — 58 items
BLADE SAGITTAL AGGR TOOTH XLG (BLADE) ×2 IMPLANT
BNDG COHESIVE 6X5 TAN ST LF (GAUZE/BANDAGES/DRESSINGS) ×6 IMPLANT
BOWL CEMENT MIXING ADV NOZZLE (MISCELLANEOUS) ×1 IMPLANT
CANISTER WOUND CARE 500ML ATS (WOUND CARE) ×2 IMPLANT
CEMENT BONE 40GM (Cement) ×2 IMPLANT
CEMENT RESTRICTOR DEPUY SZ 3 (Cement) ×1 IMPLANT
CHLORAPREP W/TINT 26 (MISCELLANEOUS) ×2 IMPLANT
COVER BACK TABLE REUSABLE LG (DRAPES) ×2 IMPLANT
DRAPE 3/4 80X56 (DRAPES) ×6 IMPLANT
DRAPE C-ARM XRAY 36X54 (DRAPES) ×2 IMPLANT
DRAPE INCISE IOBAN 66X60 STRL (DRAPES) IMPLANT
DRAPE POUCH INSTRU U-SHP 10X18 (DRAPES) ×2 IMPLANT
DRESSING SURGICEL FIBRLLR 1X2 (HEMOSTASIS) ×2 IMPLANT
DRSG MEPILEX SACRM 8.7X9.8 (GAUZE/BANDAGES/DRESSINGS) ×2 IMPLANT
DRSG SURGICEL FIBRILLAR 1X2 (HEMOSTASIS) ×4
ELECT BLADE 6.5 EXT (BLADE) ×2 IMPLANT
ELECT REM PT RETURN 9FT ADLT (ELECTROSURGICAL) ×2
ELECTRODE REM PT RTRN 9FT ADLT (ELECTROSURGICAL) ×1 IMPLANT
GLOVE SURG SYN 9.0  PF PI (GLOVE) ×2
GLOVE SURG SYN 9.0 PF PI (GLOVE) ×2 IMPLANT
GLOVE SURG UNDER POLY LF SZ9 (GLOVE) ×2 IMPLANT
GOWN SRG 2XL LVL 4 RGLN SLV (GOWNS) ×1 IMPLANT
GOWN STRL NON-REIN 2XL LVL4 (GOWNS) ×1
GOWN STRL REUS W/ TWL LRG LVL3 (GOWN DISPOSABLE) ×1 IMPLANT
GOWN STRL REUS W/TWL LRG LVL3 (GOWN DISPOSABLE) ×1
HIP FEM HD M 28 (Head) ×1 IMPLANT
HIP STEM FEM 2 STD (Stem) ×1 IMPLANT
HOLDER FOLEY CATH W/STRAP (MISCELLANEOUS) ×2 IMPLANT
KIT PREVENA INCISION MGT 13 (CANNISTER) ×2 IMPLANT
KNIFE SCULPS 14X20 (INSTRUMENTS) ×1 IMPLANT
LINER DBL MOB SZ 0 52MM (Liner) ×1 IMPLANT
MANIFOLD NEPTUNE II (INSTRUMENTS) ×2 IMPLANT
MAT ABSORB  FLUID 56X50 GRAY (MISCELLANEOUS) ×1
MAT ABSORB FLUID 56X50 GRAY (MISCELLANEOUS) ×1 IMPLANT
NDL SPNL 20GX3.5 QUINCKE YW (NEEDLE) ×2 IMPLANT
NEEDLE SPNL 20GX3.5 QUINCKE YW (NEEDLE) ×4 IMPLANT
NOZZLE FLEX THIN (MISCELLANEOUS) ×1 IMPLANT
NS IRRIG 1000ML POUR BTL (IV SOLUTION) ×2 IMPLANT
PACK HIP COMPR (MISCELLANEOUS) ×2 IMPLANT
PRESSURIZER CEMENT PROX FEM SM (MISCELLANEOUS) ×1 IMPLANT
SCALPEL PROTECTED #10 DISP (BLADE) ×4 IMPLANT
SHELL ACETABULAR SZ 52 DM (Shell) ×1 IMPLANT
STAPLER SKIN PROX 35W (STAPLE) ×2 IMPLANT
STRAP SAFETY 5IN WIDE (MISCELLANEOUS) ×2 IMPLANT
SUT DVC 2 QUILL PDO  T11 36X36 (SUTURE) ×1
SUT DVC 2 QUILL PDO T11 36X36 (SUTURE) ×1 IMPLANT
SUT SILK 0 (SUTURE) ×1
SUT SILK 0 30XBRD TIE 6 (SUTURE) ×1 IMPLANT
SUT V-LOC 90 ABS DVC 3-0 CL (SUTURE) ×2 IMPLANT
SUT VIC AB 1 CT1 36 (SUTURE) ×2 IMPLANT
SYR 20ML LL LF (SYRINGE) ×2 IMPLANT
SYR 30ML LL (SYRINGE) ×2 IMPLANT
SYR 50ML LL SCALE MARK (SYRINGE) ×4 IMPLANT
SYR BULB IRRIG 60ML STRL (SYRINGE) ×2 IMPLANT
TAPE MICROFOAM 4IN (TAPE) ×2 IMPLANT
TOWEL OR 17X26 4PK STRL BLUE (TOWEL DISPOSABLE) IMPLANT
TRAY FOLEY MTR SLVR 16FR STAT (SET/KITS/TRAYS/PACK) ×2 IMPLANT
WAND WEREWOLF FASTSEAL 6.0 (MISCELLANEOUS) ×1 IMPLANT

## 2021-10-17 NOTE — Evaluation (Signed)
Physical Therapy Evaluation ?Patient Details ?Name: Brittney Ramirez ?MRN: [id number] ?DOB: 1941-06-07 ?Today's Date: 10/17/2021 ? ?History of Present Illness ? Pt is 81 y.o. female s/p R THA on 10/17/21.  Pt has PMH including: sleep apnea, former smoker, HTN, CAD, CHF, DOE, psycharitric disorder, GERD, hypothyroidism, renal disease, and arthritis. ? ?  ?Clinical Impression ? Pt received in Semi-Fowler's position and agreeable to therapy.  Pt performed well with bed-level exercises, noting increased difficulty with SLR on the R LE.  Pt required AAROM to achieve full ROM, but was able to complete towards with no assistance required at end of set.  Pt then progressed to bed mobility coming into seated position before noting some dizziness.  Pt allowed feeling to subside before standing where patient performed weight shifts and marches in standing for tolerance levels.  Pt performed well, but started feeling nauseous.  Pt given emesis bag and cool wet towels placed behind neck and on forehead.  Pt vomited slightly, then assisted back to bed with modA for LE's back into bed.  Pt left with call bell in place, nursing and husband in room.  Anticipating pt to be able to d/c home with HHPT at this time, however, this is dependent on how pt performs during PT session(s) tomorrow, specifically with stair training.  Pt will benefit from skilled PT intervention to increase independence and safety with basic mobility in preparation for discharge to the venue listed below.   ? ?   ? ?Recommendations for follow up therapy are one component of a multi-disciplinary discharge planning process, led by the attending physician.  Recommendations may be updated based on patient status, additional functional criteria and insurance authorization. ? ?Follow Up Recommendations Home health PT ? ?  ?Assistance Recommended at Discharge Intermittent Supervision/Assistance  ?Patient can return home with the following ? A little help with walking and/or  transfers ? ?  ?Equipment Recommendations Rolling walker (2 wheels)  ?Recommendations for Other Services ?    ?  ?Functional Status Assessment Patient has had a recent decline in their functional status and demonstrates the ability to make significant improvements in function in a reasonable and predictable amount of time.  ? ?  ?Precautions / Restrictions Precautions ?Precautions: Anterior Hip ?Precaution Booklet Issued: No ?Restrictions ?Weight Bearing Restrictions: Yes ?RLE Weight Bearing: Weight bearing as tolerated  ? ?  ? ?Mobility ? Bed Mobility ?Overal bed mobility: Needs Assistance ?Bed Mobility: Supine to Sit, Sit to Supine ?  ?  ?Supine to sit: Min guard ?Sit to supine: Min guard ?  ?General bed mobility comments: Verbal cuing for hand placement. ?  ? ?Transfers ?Overall transfer level: Needs assistance ?Equipment used: Rolling walker (2 wheels) ?Transfers: Sit to/from Stand ?Sit to Stand: Min assist ?  ?  ?  ?  ?  ?General transfer comment: verbal cuing for hand placement. ?  ? ?Ambulation/Gait ?  ?  ?  ?  ?  ?  ?  ?General Gait Details: deferred due to inability to tolerate standing marches without becoming ill. ? ?Stairs ?  ?  ?  ?  ?  ? ?Wheelchair Mobility ?  ? ?Modified Rankin (Stroke Patients Only) ?  ? ?  ? ?Balance Overall balance assessment: Needs assistance ?Sitting-balance support: No upper extremity supported ?Sitting balance-Leahy Scale: Fair ?  ?  ?Standing balance support: Bilateral upper extremity supported ?Standing balance-Leahy Scale: Poor ?Standing balance comment: Pt with heavy UE use for support to remain upright. ?  ?  ?  ?  ?  ?  ?  ?  ?  ?  ?  ?   ? ? ? ?  Pertinent Vitals/Pain Pain Assessment ?Pain Assessment: 0-10 ?Pain Score: 5  ?Pain Location: R hip at incision site. ?Pain Descriptors / Indicators: Burning ?Pain Intervention(s): Limited activity within patient's tolerance, Monitored during session, Premedicated before session  ? ? ?Home Living Family/patient expects to be  discharged to:: Private residence ?Living Arrangements: Spouse/significant other ?Available Help at Discharge: Family;Available 24 hours/day ?Type of Home: House ?Home Access: Stairs to enter ?Entrance Stairs-Rails: Can reach both;Right;Left ?Entrance Stairs-Number of Steps: 3-4 ?  ?Home Layout: One level ?Home Equipment: Conservation officer, nature (2 wheels);Cane - single point;Wheelchair - manual ?   ?  ?Prior Function Prior Level of Function : Independent/Modified Independent ?  ?  ?  ?  ?  ?  ?  ?  ?  ? ? ?Hand Dominance  ? Dominant Hand: Right ? ?  ?Extremity/Trunk Assessment  ?   ?  ? ?  ?  ? ?   ?Communication  ? Communication: No difficulties  ?Cognition Arousal/Alertness: Awake/alert ?Behavior During Therapy: [facility] for tasks assessed/performed ?Overall Cognitive Status: Within Functional Limits for tasks assessed ?  ?  ?  ?  ?  ?  ?  ?  ?  ?  ?  ?  ?  ?  ?  ?  ?  ?  ?  ? ?  ?General Comments   ? ?  ?Exercises Total Joint Exercises ?Ankle Circles/Pumps: AROM, Strengthening, Both, 10 reps, Supine ?Quad Sets: AROM, Strengthening, Both, 10 reps, Supine ?Gluteal Sets: AROM, Strengthening, Both, 10 reps, Supine ?Heel Slides: AROM, Strengthening, Both, 10 reps, Supine ?Hip ABduction/ADduction: AROM, Strengthening, Both, 10 reps, Supine ?Straight Leg Raises: AROM, Strengthening, Both, 10 reps, Supine ?Long Arc Quad: AROM, Strengthening, Both, 10 reps, Seated ?Marching in Standing: AROM, Strengthening, Both, 10 reps, Standing ?Other Exercises ?Other Exercises: Pt and husband educated on roles of PT and services provided during hospital stay.  ? ?Assessment/Plan  ?  ?PT Assessment Patient needs continued PT services  ?PT Problem List Decreased strength;Decreased range of motion;Decreased activity tolerance;Decreased balance;Decreased mobility;Decreased knowledge of use of DME;Pain ? ?   ?  ?PT Treatment Interventions DME instruction;Gait training;Stair training;Functional mobility training;Therapeutic activities;Therapeutic  exercise;Balance training;Neuromuscular re-education   ? ?PT Goals (Current goals can be found in the Care Plan section)  ?Acute Rehab PT Goals ?Patient Stated Goal: to get better ?PT Goal Formulation: With patient ?Time For Goal Achievement: 10/31/21 ?Potential to Achieve Goals: Good ? ?  ?Frequency BID ?  ? ? ?Co-evaluation   ?  ?  ?  ?  ? ? ?  ?AM-PAC PT "6 Clicks" Mobility  ?Outcome Measure Help needed turning from your back to your side while in a flat bed without using bedrails?: A Lot ?Help needed moving from lying on your back to sitting on the side of a flat bed without using bedrails?: A Lot ?Help needed moving to and from a bed to a chair (including a wheelchair)?: A Lot ?Help needed standing up from a chair using your arms (e.g., wheelchair or bedside chair)?: A Lot ?Help needed to walk in hospital room?: A Lot ?Help needed climbing 3-5 steps with a railing? : Total ?6 Click Score: 11 ? ?  ?End of Session Equipment Utilized During Treatment: Gait belt ?Activity Tolerance: Patient tolerated treatment well ?Patient left: in bed;with call bell/phone within reach;with bed alarm set;with nursing/sitter in room;with family/visitor present ?Nurse Communication: Mobility status ?PT Visit Diagnosis: Unsteadiness on feet (R26.81);Muscle weakness (generalized) (M62.81);Difficulty in walking, not elsewhere classified (R26.2);Pain ?Pain - Right/Left: Right ?  Pain - part of body: Hip ?  ? ?Time: 7048-8891 ?PT Time Calculation (min) (ACUTE ONLY): 51 min ? ? ?Charges:   PT Evaluation ?$PT Eval Low Complexity: 1 Low ?PT Treatments ?$Therapeutic Exercise: 8-22 mins ?$Therapeutic Activity: 23-37 mins ?  ?   ? ? ?Gwenlyn Saran, PT, DPT ?10/17/21, 5:27 PM ? ? ?Christie Nottingham ?10/17/2021, 5:22 PM ? ?

## 2021-10-17 NOTE — Anesthesia Procedure Notes (Signed)
Procedure Name: Intubation ?Date/Time: 10/17/2021 7:25 AM ?Performed by: Natasha Mead, CRNA ?Pre-anesthesia Checklist: Patient identified, Emergency Drugs available, Suction available and Patient being monitored ?Patient Re-evaluated:Patient Re-evaluated prior to induction ?Oxygen Delivery Method: Circle system utilized ?Preoxygenation: Pre-oxygenation with 100% oxygen ?Induction Type: IV induction ?Ventilation: Mask ventilation without difficulty ?Laryngoscope Size: Sabra Heck and 2 ?Grade View: Grade I ?Tube type: Oral ?Tube size: 7.0 mm ?Number of attempts: 1 ?Airway Equipment and Method: Stylet and Oral airway ?Placement Confirmation: ETT inserted through vocal cords under direct vision, positive ETCO2 and breath sounds checked- equal and bilateral ?Secured at: 20 cm ?Tube secured with: Tape ?Dental Injury: Teeth and Oropharynx as per pre-operative assessment  ? ? ? ? ?

## 2021-10-17 NOTE — Op Note (Signed)
10/17/2021 ? ?9:15 AM ? ?PATIENT:  Brittney Ramirez  81 y.o. female ? ?PRE-OPERATIVE DIAGNOSIS:  Primary osteoarthritis of right hip M16.11 ? ?POST-OPERATIVE DIAGNOSIS:  Primary osteoarthritis of right hip M16.11 ? ?PROCEDURE:  Procedure(s): ?TOTAL HIP ARTHROPLASTY ANTERIOR APPROACH (Right) ? ?SURGEON: Laurene Footman, MD ? ?ASSISTANTS: None ? ?ANESTHESIA:   general ? ?EBL:  Total I/O ?In: 200 [IV [id number]:389] ?Out: 150 [Blood:150] ? ?BLOOD ADMINISTERED:none ? ?DRAINS:  Incisional wound  vac ? ?LOCAL MEDICATIONS USED:  MARCAINE    and OTHER Exparel ? ?SPECIMEN: Right femoral head ? ?DISPOSITION OF SPECIMEN:  PATHOLOGY ? ?COUNTS:  YES ? ?TOURNIQUET:  * No tourniquets in log * ? ?IMPLANTS: Medacta cemented AMIS 2 standard stem, 52 mm Mpact DM cup and liner with metal M 28 mm head ? ?DICTATION: .Dragon Dictation   The patient was brought to the operating room and after general anesthesia was obtained patient was placed on the operative table with the ipsilateral foot into the Medacta attachment, contralateral leg on a well-padded table. C-arm was brought in and preop template x-ray taken. After prepping and draping in usual sterile fashion appropriate patient identification and timeout procedures were completed. Anterior approach to the hip was obtained and centered over the greater trochanter and TFL muscle. The subcutaneous tissue was incised hemostasis being achieved by electrocautery. TFL fascia was incised and the muscle retracted laterally deep retractor placed. The lateral femoral circumflex vessels were identified and ligated. The anterior capsule was exposed and a capsulotomy performed. The neck was identified and a femoral neck cut carried out with a saw. The head was removed without difficulty and showed sclerotic femoral head and acetabulum. Reaming was carried out to 52 mm and a 52 mm cup trial gave appropriate tightness to the acetabular component a 52 DM cup was impacted into position. The leg was then  externally rotated and ischiofemoral and pubofemoral releases carried out. The femur was sequentially broached to a size 3, size 3 standard with S head trials were placed and the stem size determined.  The canal was prepared for cement fixation with a 3 DePuy cement restrictor and drying the canal inserting the cement pressurizing and placing the 2 standard stem.  After excess cement was removed and the sponges removed from the acetabulum to protect it from any cement trials were placed and millimeters neck length was determined.  The dual mobility head was assembled impacted and reduced and was stable to appearance and appropriate offset and length determined. . The hip was reduced and was stable the wound was thoroughly irrigated with fibrillar placed along the posterior capsule and medial neck. The deep fascia ws closed using a heavy Quill after infiltration of 30 cc of quarter percent Sensorcaine with epinephrine diluted with Exparel throughout the case .3-0 V-loc to close the skin with skin staples.  Incisional wound VAC applied and patient was sent to recovery in stable condition.  ? ?PLAN OF CARE: Admit for overnight observation ? ?

## 2021-10-17 NOTE — Transfer of Care (Signed)
Immediate Anesthesia Transfer of Care Note ? ?Patient: Brittney Ramirez ? ?Procedure(s) Performed: TOTAL HIP ARTHROPLASTY ANTERIOR APPROACH (Right: Hip) ? ?Patient Location: PACU ? ?Anesthesia Type:General ? ?Level of Consciousness: drowsy ? ?Airway & Oxygen Therapy: Patient Spontanous Breathing and Patient connected to nasal cannula oxygen ? ?Post-op Assessment: Report given to RN and Post -op Vital signs reviewed and stable ? ?Post vital signs: stable ? ?Last Vitals:  ?Vitals Value Taken Time  ?BP 113/72 10/17/21 0918  ?Temp 36.2 ?C 10/17/21 0915  ?Pulse 100 10/17/21 0915  ?Resp 14 10/17/21 0919  ?SpO2 100 % 10/17/21 0915  ?Vitals shown include unvalidated device data. ? ?Last Pain:  ?Vitals:  ? 10/17/21 0644  ?TempSrc: Oral  ?PainSc: 0-No pain  ?   ? ?  ? ?Complications: No notable events documented. ?

## 2021-10-17 NOTE — Anesthesia Postprocedure Evaluation (Signed)
Anesthesia Post Note ? ?Patient: Brittney Ramirez ? ?Procedure(s) Performed: TOTAL HIP ARTHROPLASTY ANTERIOR APPROACH (Right: Hip) ? ?Patient location during evaluation: PACU ?Anesthesia Type: General ?Level of consciousness: awake and alert ?Pain management: pain level controlled ?Vital Signs Assessment: post-procedure vital signs reviewed and stable ?Respiratory status: spontaneous breathing, nonlabored ventilation, respiratory function stable and patient connected to nasal cannula oxygen ?Cardiovascular status: blood pressure returned to baseline and stable ?Postop Assessment: no apparent nausea or vomiting ?Anesthetic complications: no ? ? ?No notable events documented. ? ? ?Last Vitals:  ?Vitals:  ? 10/17/21 1015 10/17/21 1030  ?BP: 120/69 119/75  ?Pulse: 84 79  ?Resp: 15 14  ?Temp:    ?SpO2: 100% 95%  ?  ?Last Pain:  ?Vitals:  ? 10/17/21 1030  ?TempSrc:   ?PainSc: Asleep  ? ? ?  ?  ?  ?  ?  ?  ? ?Brittney Ramirez ? ? ? ? ?

## 2021-10-17 NOTE — H&P (Signed)
Chief Complaint  ?Patient presents with  ? H&P Rt Hip  ? ? ?History of the Present Illness: ?Brittney Ramirez is a 81 y.o. female here today for history and physical for right total hip arthroplasty with Dr. Hessie Knows on 10/17/2021. She has x-rays from January 2023 showing complete loss of joint space of the right hip joint with erosion of the femoral head and subchondral cyst formation and osteophyte formation. She can walk slightly but mostly uses a wheelchair due to the severe pain in her right hip. Pain has been severe for 6 months. Pain is present in her groin, lateral hip and buttocks. Has also had numbness tingling in the right leg that resolved with lumbar ESI injection. Pain now is mostly along the buttocks and groin and increased weightbearing activity. Previous physical exam shows severe pain in the buttocks and groin with right hip internal rotation. She denies any weakness numbness or tingling. Pain is severe and interfering with quality of life and activities day living. ? ?The patient had a pelvis fracture that healed back in 2016.  ? ?The patient had seen Dr. Nehemiah Massed and is on Xarelto for atrial fibrillation. ? ?The patient mentions she had nosebleeds when she took COVID-19 shot, and is unsure of the cause. ? ?I have reviewed past medical, surgical, social and family history, and allergies as documented in the EMR. ? ?Past Medical History: ?Past Medical History:  ?Diagnosis Date  ? Abdominal pain, RLQ (right lower quadrant) 05/07/2015  ? Allergy n/a  ? Arrhythmia n/a  ? Arthritis n/a  ? Atrial fibrillation (CMS-HCC)  ? Chronic kidney disease  ? Chronic systolic CHF (congestive heart failure), NYHA class 2 (CMS-HCC)  ? Dementia (CMS-HCC) n/a  ? Fatty infiltration of liver 05/07/2015  ? GERD (gastroesophageal reflux disease) n/a  ? Hyperlipidemia  ? Hypertension  ? Lung nodule seen on imaging study 05/07/2015  ? Pulmonary nodules 05/07/2015  ? Sleep apnea  ? ?Past Surgical History: ?Past Surgical History:   ?Procedure Laterality Date  ? COLONOSCOPY 04/10/2000  ?Colitis, Diverticulosis  ? COLONOSCOPY 05/07/2004  ?Diverticulosis  ? COLONOSCOPY 04/22/2012  ?Adenomatous Polyp: CBF 04/2017; Recall Ltr mailed 02/27/2017 (dw)  ? EGD 04/22/2012  ?Tenino Barrett's Esophagus: No repeat per RTE  ? APPENDECTOMY  ? ARTHROPLASTY TOTAL KNEE Bilateral  ? CATARACT EXTRACTION n/a  ? CHOLECYSTECTOMY n/a  ? EGD 05/07/2004, 04/10/2000  ?Barrett's Esophagus  ? JOINT REPLACEMENT 2 knees  ? REMOVAL OVARIAN CYST  ? ?Past Family History: ?Family History  ?Problem Relation Age of Onset  ? Myocardial Infarction (Heart attack) Mother  ? Aneurysm Mother  ? Myocardial Infarction (Heart attack) Father  ? Stroke Brother  ? Breast cancer Sister  ? Bone cancer Sister  ? Myocardial Infarction (Heart attack) Sister  ? High blood pressure (Hypertension) Sister  ? Cancer Sister  ? Myocardial Infarction (Heart attack) Brother  ? ?Medications: ?Current Outpatient Medications Ordered in Epic  ?Medication Sig Dispense Refill  ? acetaminophen (TYLENOL) 500 MG tablet Take 500 mg by mouth every 8 (eight) hours as needed for Pain  ? apoaequorin (PREVAGEN) capsule Take by mouth  ? atorvastatin (LIPITOR) 40 MG tablet TAKE 1 TABLET EVERY DAY 90 tablet 1  ? cyanocobalamin (VITAMIN B12) 1000 MCG tablet Take 1,000 mcg by mouth once daily  ? donepeziL (ARICEPT) 5 MG tablet Take 1 tablet (5 mg total) by mouth at bedtime 90 tablet 3  ? latanoprost (XALATAN) 0.005 % ophthalmic solution Place 1 drop into both eyes nightly.  ? levothyroxine (  SYNTHROID) 88 MCG tablet TAKE 1 TABLET EVERY DAY 90 tablet 1  ? metoprolol succinate (TOPROL-XL) 100 MG XL tablet TAKE 1 TABLET EVERY DAY 90 tablet 1  ? valsartan-hydrochlorothiazide (DIOVAN-HCT) 160-25 mg tablet TAKE 1 TABLET EVERY DAY 90 tablet 1  ? XARELTO 15 mg tablet TAKE 1 TABLET EVERY DAY 90 tablet 3  ? ?No current Epic-ordered facility-administered medications on file.  ? ?Allergies: ?Allergies  ?Allergen Reactions  ? Codeine Nausea  ?  Codeine Sulfate Other (See Comments)  ?GI upset  ? Morphine Other (See Comments)  ?GI upset  ? Alendronate Rash  ? Buprenorphine Hcl Nausea And Vomiting  ?Diarrhea  ? Desipramine Nausea  ? ? ?Body mass index is 25.4 kg/m?. ? ?Review of Systems: ?A comprehensive 14 point ROS was performed, reviewed, and the pertinent orthopaedic findings are documented in the HPI. ? ?Vitals:  ?10/10/21 1451  ?BP: 138/70  ? ? ?General Physical Examination:  ? ?General:  ?Well developed, well nourished, no apparent distress, normal affect, antalgic gait ? ?HEENT: ?Head normocephalic, atraumatic, PERRL.  ? ?Abdomen: ?Soft, non tender, non distended, Bowel sounds present. ? ?Heart: ?Examination of the heart reveals regular, rate, and rhythm. There is no murmur noted on ascultation. There is a normal apical pulse. ? ?Lungs: ?Lungs are clear to auscultation. There is no wheeze, rhonchi, or crackles. There is normal expansion of bilateral chest walls.  ? ?Musculoskeletal Examination: ?On exam, left hip internal rotation is 30 degrees, and external is 50 degrees without pain. Stiff right hip with range of motion -10 to 20 degrees.  ? ?Radiographs: ? ?X-rays of the right hip reviewed by me today from 08/20/2021 shows complete loss of joint space and right hip joint superior acetabulum with severe sclerotic changes subchondral cyst formation and spurring. No evidence of acute bony abnormality. ? ?Assessment: ?ICD-10-CM  ?1. Primary osteoarthritis of right hip M16.11  ? ?Plan: ? ?35. 81 year old female with advanced right hip osteoarthritis is interfering with quality of life and activities daily living. X-rays show advanced right hip osteoarthritis with complete loss of joint space. Pain is severe and interfering with her ability to walk and stand. Risks, benefits, complications of a right total hip arthroplasty have been discussed with the patient. Patient has agreed and consented procedure with Dr. Hessie Knows on 10/17/2021. ? ?Electronically  signed by Feliberto Gottron, PA at 10/10/2021 3:15 PM EST ?Reviewed  H+P. ?No changes noted. ? ? ?

## 2021-10-17 NOTE — TOC Progression Note (Signed)
Transition of Care (TOC) - Progression Note  ? ? ?Patient Details  ?Name: DULCINEA KINSER ?MRN: [id number] ?Date of Birth: 09/03/1940 ? ?Transition of Care (TOC) CM/SW Contact  ?Conception Oms, RN ?Phone Number: ?10/17/2021, 10:35 AM ? ?Clinical Narrative:    ? ?Set up with [place] for [facility] services ? ?  ?  ? ?Expected Discharge Plan and Services ?  ?  ?  ?  ?  ?                ?  ?  ?  ?  ?  ?  ?  ?  ?  ?  ? ? ?Social Determinants of Health (SDOH) Interventions ?  ? ?Readmission Risk Interventions ?No flowsheet data found. ? ?

## 2021-10-17 NOTE — Anesthesia Preprocedure Evaluation (Addendum)
Anesthesia Evaluation  ?Patient identified by MRN, date of birth, ID band ?Patient awake ? ? ? ?Reviewed: ?Allergy & Precautions, NPO status , Patient's Chart, lab work & pertinent test results ? ?History of Anesthesia Complications ?Negative for: history of anesthetic complications ? ?Airway ?Mallampati: III ? ?TM Distance: >3 FB ?Neck ROM: full ? ? ? Dental ? ?(+) Missing ?  ?Pulmonary ?sleep apnea , former smoker,  ?  ?Pulmonary exam normal ? ? ? ? ? ? ? Cardiovascular ?Exercise Tolerance: Good ?hypertension, (-) angina+ CAD, +CHF and + DOE  ? ?Rhythm:irregular Rate:Normal ? ? ?  ?Neuro/Psych ?neg Seizures PSYCHIATRIC DISORDERS negative neurological ROS ?   ? GI/Hepatic ?negative GI ROS, Neg liver ROS, GERD  Controlled,  ?Endo/Other  ?Hypothyroidism  ? Renal/GU ?Renal disease  ? ?  ?Musculoskeletal ? ?(+) Arthritis ,  ? Abdominal ?  ?Peds ? Hematology ?negative hematology ROS ?(+)   ?Anesthesia Other Findings ?Past Medical History: ?No date: Aortic atherosclerosis ([place]) ?No date: Arthritis ?No date: Atrial fibrillation ([place]) ?    Comment:  a.) CHA2DS2-VASc = 6 (age x 2, sex, CHF, HTN, aortic  ?             plaque). b.) rate/rhythm maintained on oral metoprolol  ?             succinate; chronically anticoagulated using dose reduced  ?             rivaroxaban d/t CKD. ?No date: Basal cell carcinoma ?10/26/2014: CHF (congestive heart failure) ([place]) ?    Comment:  a.)  TTE 10/26/2014: EF 45%; mild AR/PR; moderate MR/TR. ?             b.) TTE 12/03/2016 mild LV dysfunction with LVH; EF 40%;  ?             global HK; severe LA and moderate RA enlargement; mild  ?             AR/PR, moderate MR/TR. c.)  TTE 01/25/2020: EF 45%;  ?             global HK; moderate LAE and mild RA enlargement; mild RV  ?             enlargement; trivial PR, mild AR, moderate to severe MR,  ?             moderate TR. ?No date: CKD (chronic kidney disease), stage III ([place]) ?No date: Coronary artery  calcification seen on CT scan ?No date: Dementia [facility]) ?    Comment:  a.) on donepezil + apoaequorin ?No date: Diverticulosis ?No date: DOE (dyspnea on exertion) ?No date: Fatty infiltration of liver ?No date: Full dentures ?No date: GERD (gastroesophageal reflux disease) ?No date: Hyperlipidemia ?No date: Hypertension ?No date: Hypothyroidism ?02/1995: Jaw fracture ([place]) ?    Comment:  a.) BILATERAL subcondylar fractures ?No date: Long term current use of anticoagulant ?    Comment:  a.) rivaroxaban; dose reduced d/t CKD ?No date: Miscarriage ?No date: OSA (obstructive sleep apnea) ?    Comment:  a.) does not require nocturnal PAP therapy ?No date: Osteoporosis ?No date: Pulmonary nodules ?12/14/2015: Thoracic aortic aneurysm (TAA) ?    Comment:  a.) CT 12/14/2015: measured 4.2 cm. b.) CT 11/28/2016:  ?             not appreciated by radiologist, however CT reviewed by  ?             vascular and 4.2-4.3  cm aneurysmal dilitation noted. ?No date: Thyroid cyst ? ?Past Surgical History: ?No date: CATARACT EXTRACTION; Right ?2014: CHOLECYSTECTOMY ?No date: COLONOSCOPY ?2003: JOINT REPLACEMENT; Bilateral ?    Comment:  knees ? ?BMI   ? Body Mass Index: 26.95 kg/m?  ?  ? ? Reproductive/Obstetrics ?negative OB ROS ? ?  ? ? ? ? ? ? ? ? ? ? ? ? ? ?  ?  ? ? ? ? ? ? ? ?Anesthesia Physical ?Anesthesia Plan ? ?ASA: 3 ? ?Anesthesia Plan: General ETT  ? ?Post-op Pain Management:   ? ?Induction: Intravenous ? ?PONV Risk Score and Plan: Ondansetron, Dexamethasone, Midazolam and Treatment may vary due to age or medical condition ? ?Airway Management Planned: Oral ETT ? ?Additional Equipment:  ? ?Intra-op Plan:  ? ?Post-operative Plan: Extubation in OR ? ?Informed Consent: I have reviewed the patients History and Physical, chart, labs and discussed the procedure including the risks, benefits and alternatives for the proposed anesthesia with the patient or authorized representative who has indicated his/her understanding and  acceptance.  ? ? ? ?Dental Advisory Given ? ?Plan Discussed with: Anesthesiologist, CRNA and Surgeon ? ?Anesthesia Plan Comments: (Patient does not meet anti coagultion guidelines for spinal ? ?Patient consented for risks of anesthesia including but not limited to:  ?- adverse reactions to medications ?- damage to eyes, teeth, lips or other oral mucosa ?- nerve damage due to positioning  ?- sore throat or hoarseness ?- Damage to heart, brain, nerves, lungs, other parts of body or loss of life ? ?Patient voiced understanding.)  ? ? ? ? ? ?Anesthesia Quick Evaluation ? ?

## 2021-10-18 ENCOUNTER — Encounter: Payer: Self-pay | Admitting: Orthopedic Surgery

## 2021-10-18 DIAGNOSIS — F0390 Unspecified dementia without behavioral disturbance: Secondary | ICD-10-CM | POA: Diagnosis not present

## 2021-10-18 DIAGNOSIS — E039 Hypothyroidism, unspecified: Secondary | ICD-10-CM | POA: Diagnosis not present

## 2021-10-18 DIAGNOSIS — I4891 Unspecified atrial fibrillation: Secondary | ICD-10-CM | POA: Diagnosis not present

## 2021-10-18 DIAGNOSIS — Z7901 Long term (current) use of anticoagulants: Secondary | ICD-10-CM | POA: Diagnosis not present

## 2021-10-18 DIAGNOSIS — Z96653 Presence of artificial knee joint, bilateral: Secondary | ICD-10-CM | POA: Diagnosis not present

## 2021-10-18 DIAGNOSIS — N183 Chronic kidney disease, stage 3 unspecified: Secondary | ICD-10-CM | POA: Diagnosis not present

## 2021-10-18 DIAGNOSIS — I130 Hypertensive heart and chronic kidney disease with heart failure and stage 1 through stage 4 chronic kidney disease, or unspecified chronic kidney disease: Secondary | ICD-10-CM | POA: Diagnosis not present

## 2021-10-18 DIAGNOSIS — I5022 Chronic systolic (congestive) heart failure: Secondary | ICD-10-CM | POA: Diagnosis not present

## 2021-10-18 DIAGNOSIS — M1611 Unilateral primary osteoarthritis, right hip: Secondary | ICD-10-CM | POA: Diagnosis not present

## 2021-10-18 LAB — CBC
HCT: 29.9 % — ABNORMAL LOW (ref 36.0–46.0)
Hemoglobin: 9.7 g/dL — ABNORMAL LOW (ref 12.0–15.0)
MCH: 30.9 pg (ref 26.0–34.0)
MCHC: 32.4 g/dL (ref 30.0–36.0)
MCV: 95.2 fL (ref 80.0–100.0)
Platelets: 108 10*3/uL — ABNORMAL LOW (ref 150–400)
RBC: 3.14 MIL/uL — ABNORMAL LOW (ref 3.87–5.11)
RDW: 13.7 % (ref 11.5–15.5)
WBC: 7.1 10*3/uL (ref 4.0–10.5)
nRBC: 0 % (ref 0.0–0.2)

## 2021-10-18 LAB — BASIC METABOLIC PANEL
Anion gap: 6 (ref 5–15)
BUN: 25 mg/dL — ABNORMAL HIGH (ref 8–23)
CO2: 26 mmol/L (ref 22–32)
Calcium: 9 mg/dL (ref 8.9–10.3)
Chloride: 108 mmol/L (ref 98–111)
Creatinine, Ser: 1.25 mg/dL — ABNORMAL HIGH (ref 0.44–1.00)
GFR, Estimated: 43 mL/min — ABNORMAL LOW (ref >60)
Glucose, Bld: 122 mg/dL — ABNORMAL HIGH (ref 70–99)
Potassium: 4.5 mmol/L (ref 3.5–5.1)
Sodium: 140 mmol/L (ref 135–145)

## 2021-10-18 LAB — SURGICAL PATHOLOGY

## 2021-10-18 MED ORDER — HYDROCODONE-ACETAMINOPHEN 5-325 MG PO TABS
ORAL_TABLET | ORAL | Status: AC
  Filled 2021-10-18: qty 1

## 2021-10-18 MED ORDER — METOCLOPRAMIDE HCL 5 MG/ML IJ SOLN: 10.0000 mg | Freq: Four times a day (QID) | INTRAMUSCULAR | Status: DC | PRN

## 2021-10-18 MED ORDER — FE FUMARATE-B12-VIT C-FA-IFC PO CAPS
1.0000 | ORAL_CAPSULE | Freq: Two times a day (BID) | ORAL | Status: DC
  Administered 2021-10-18 – 2021-10-19 (×3): 1 via ORAL
  Filled 2021-10-18 (×4): qty 1

## 2021-10-18 MED ORDER — METHOCARBAMOL 500 MG PO TABS: 500.0000 mg | ORAL_TABLET | Freq: Four times a day (QID) | ORAL | 0 refills | Status: AC | PRN

## 2021-10-18 MED ORDER — METOCLOPRAMIDE HCL 5 MG/ML IJ SOLN
INTRAMUSCULAR | Status: AC
  Administered 2021-10-18: 10 mg via INTRAVENOUS
  Filled 2021-10-18: qty 2

## 2021-10-18 MED ORDER — POLYETHYLENE GLYCOL 3350 17 G PO PACK: 17.0000 g | PACK | Freq: Every day | ORAL | 0 refills | Status: AC | PRN

## 2021-10-18 MED ORDER — ONDANSETRON HCL 4 MG PO TABS: 4.0000 mg | ORAL_TABLET | Freq: Four times a day (QID) | ORAL | 0 refills | Status: AC | PRN

## 2021-10-18 MED ORDER — HYDROCODONE-ACETAMINOPHEN 7.5-325 MG PO TABS
0.5000 | ORAL_TABLET | ORAL | 0 refills | Status: AC | PRN
Start: 2021-10-18 — End: ?

## 2021-10-18 MED ORDER — HYDROCODONE-ACETAMINOPHEN 5-325 MG PO TABS
ORAL_TABLET | ORAL | Status: AC
  Administered 2021-10-18: 1 via ORAL
  Filled 2021-10-18: qty 1

## 2021-10-18 MED ORDER — DOCUSATE SODIUM 100 MG PO CAPS: 100.0000 mg | ORAL_CAPSULE | Freq: Two times a day (BID) | ORAL | 0 refills | Status: AC

## 2021-10-18 MED ORDER — DOCUSATE SODIUM 100 MG PO CAPS
ORAL_CAPSULE | ORAL | Status: AC
  Administered 2021-10-18: 100 mg via ORAL
  Filled 2021-10-18: qty 1

## 2021-10-18 MED ORDER — PANTOPRAZOLE SODIUM 40 MG PO TBEC
DELAYED_RELEASE_TABLET | ORAL | Status: AC
  Administered 2021-10-18: 40 mg via ORAL
  Filled 2021-10-18: qty 1

## 2021-10-18 MED ORDER — FE FUMARATE-B12-VIT C-FA-IFC PO CAPS: 1.0000 | ORAL_CAPSULE | Freq: Two times a day (BID) | ORAL | 0 refills | Status: DC

## 2021-10-18 NOTE — Plan of Care (Signed)

## 2021-10-18 NOTE — Progress Notes (Signed)
Physical Therapy Treatment ?Patient Details ?Name: Brittney Ramirez ?MRN: [id number] ?DOB: Dec 30, 1940 ?Today's Date: 10/18/2021 ? ? ?History of Present Illness Pt is 81 y.o. female s/p R THA on 10/17/21.  Pt has PMH including: sleep apnea, former smoker, HTN, CAD, CHF, DOE, psycharitric disorder, GERD, hypothyroidism, renal disease, and arthritis. ? ?  ?PT Comments  ? ? Pt was pleasant but fatigued this session but willing to participate and put forth good effort throughout. Per nursing pt had an episode of bowel incontinence while ambulating to the BR with nurses which required significant pt exertion during process of completing BM and participating in hygiene.  Pt was visibly more fatigued during the session compared to the AM session and despite good effort required physical assistance with transfers and was only able to amb a max of 30 feet with very slow, effortful cadence.  Based on patient's performance during the AM session will continue to recommend discharge to home with HHPT but will reassess pt tomorrow for possible downgrade to SNF if performance does not improve. ?   ?Recommendations for follow up therapy are one component of a multi-disciplinary discharge planning process, led by the attending physician.  Recommendations may be updated based on patient status, additional functional criteria and insurance authorization. ? ?Follow Up Recommendations ? Home health PT ?  ?  ?Assistance Recommended at Discharge Intermittent Supervision/Assistance  ?Patient can return home with the following A little help with walking and/or transfers;A little help with bathing/dressing/bathroom;Assistance with cooking/housework;Direct supervision/assist for medications management;Help with stairs or ramp for entrance;Assist for transportation ?  ?Equipment Recommendations ? None recommended by PT  ?  ?Recommendations for Other Services   ? ? ?  ?Precautions / Restrictions Precautions ?Precautions: Anterior Hip ?Precaution Booklet  Issued: Yes (comment) ?Restrictions ?Weight Bearing Restrictions: Yes ?RLE Weight Bearing: Weight bearing as tolerated  ?  ? ?Mobility ? Bed Mobility ?  ?  ?  ?  ?  ?  ?  ?General bed mobility comments: NT, pt in recliner ?  ? ?Transfers ?Overall transfer level: Needs assistance ?Equipment used: Rolling walker (2 wheels) ?Transfers: Sit to/from Stand ?Sit to Stand: Min assist ?  ?  ?  ?  ?  ?General transfer comment: Mod verbal cues for hand placement and increased trunk flexion ?  ? ?Ambulation/Gait ?Ambulation/Gait assistance: Min guard ?Gait Distance (Feet): 30 Feet ?Assistive device: Rolling walker (2 wheels) ?Gait Pattern/deviations: Antalgic, Decreased stance time - right, Decreased step length - left, Trunk flexed, Step-to pattern ?Gait velocity: decreased ?  ?  ?General Gait Details: Mod verbal cues for amb closer to the RW with upright posture with pt presenting with significant decline in activity tolerance compared to AM session ? ? ?Stairs ?Stairs: Yes ?Stairs assistance: Min guard ?Stair Management: Two rails, Step to pattern, Forwards ?Number of Stairs: 4 ?General stair comments: Pt and spouse present for stair training with visual and verbal demonstration/education followed by pt practice. Pt able to ascend/descend 4 steps with good control and stability with mod verbal cues for sequencing. ? ? ?Wheelchair Mobility ?  ? ?Modified Rankin (Stroke Patients Only) ?  ? ? ?  ?Balance Overall balance assessment: Needs assistance ?Sitting-balance support: No upper extremity supported ?Sitting balance-Leahy Scale: Good ?  ?  ?Standing balance support: Bilateral upper extremity supported, During functional activity ?Standing balance-Leahy Scale: Fair ?Standing balance comment: Mod lean on the RW for support ?  ?  ?  ?  ?  ?  ?  ?  ?  ?  ?  ?  ? ?  ?  Cognition Arousal/Alertness: Awake/alert ?Behavior During Therapy: [facility] for tasks assessed/performed ?Overall Cognitive Status: History of cognitive impairments - at  baseline ?  ?  ?  ?  ?  ?  ?  ?  ?  ?  ?  ?  ?  ?  ?  ?  ?  ?  ?  ? ?  ?Exercises Total Joint Exercises ?Ankle Circles/Pumps: AROM, Strengthening, Both, 10 reps ?Quad Sets: Strengthening, Both, 10 reps ?Gluteal Sets: Strengthening, Both, 10 reps ?Hip ABduction/ADduction: AROM, AAROM, Strengthening, Both, 5 reps ?Straight Leg Raises: Strengthening, Both, AAROM, AROM, 5 reps ?Long Arc Quad: AROM, Strengthening, Both, 10 reps ?Knee Flexion: AROM, Strengthening, Both, 10 reps ?Other Exercises ?Other Exercises: HEP review per handout ?Other Exercises: HEP education for BLE APs, QS, GS, and LAQs x 10 each every 1-2 hours daily ? ?  ?General Comments   ?  ?  ? ?Pertinent Vitals/Pain Pain Assessment ?Pain Assessment: 0-10 ?Pain Score: 9  ?Pain Location: R hip ?Pain Descriptors / Indicators: Sore, Aching ?Pain Intervention(s): Repositioned, Monitored during session, Ice applied, Patient requesting pain meds-RN notified  ? ? ?Home Living   ?  ?  ?  ?  ?  ?  ?  ?  ?  ?   ?  ?Prior Function    ?  ?  ?   ? ?PT Goals (current goals can now be found in the care plan section) Progress towards PT goals: PT to reassess next treatment ? ?  ?Frequency ? ? ? BID ? ? ? ?  ?PT Plan Current plan remains appropriate  ? ? ?Co-evaluation   ?  ?  ?  ?  ? ?  ?AM-PAC PT "6 Clicks" Mobility   ?Outcome Measure ? Help needed turning from your back to your side while in a flat bed without using bedrails?: A Little ?Help needed moving from lying on your back to sitting on the side of a flat bed without using bedrails?: A Little ?Help needed moving to and from a bed to a chair (including a wheelchair)?: A Little ?Help needed standing up from a chair using your arms (e.g., wheelchair or bedside chair)?: A Little ?Help needed to walk in hospital room?: A Little ?Help needed climbing 3-5 steps with a railing? : A Little ?6 Click Score: 18 ? ?  ?End of Session Equipment Utilized During Treatment: Gait belt ?Activity Tolerance: Patient limited by  fatigue;Patient limited by pain ?Patient left: with call bell/phone within reach;with family/visitor present;in chair ?Nurse Communication: Mobility status ?PT Visit Diagnosis: Unsteadiness on feet (R26.81);Muscle weakness (generalized) (M62.81);Difficulty in walking, not elsewhere classified (R26.2);Pain ?Pain - Right/Left: Right ?Pain - part of body: Hip ?  ? ? ?Time: 7517-0017 ?PT Time Calculation (min) (ACUTE ONLY): 24 min ? ?Charges:  $Gait Training: 8-22 mins ?$Therapeutic Exercise: 8-22 mins          ?          ? ?D. Royetta Asal PT, DPT ?10/18/21, 3:24 PM ? ? ?

## 2021-10-18 NOTE — Progress Notes (Signed)
? ?Subjective: ?1 Day Post-Op Procedure(s) (LRB): ?TOTAL HIP ARTHROPLASTY ANTERIOR APPROACH (Right) ?Patient reports pain as mild.   ?Patient is well, and has had no acute complaints or problems ?Denies any CP, SOB, ABD pain. ?We will continue therapy today.  ?Plan is to go Home after hospital stay. ? ?Objective: ?Vital signs in last 24 hours: ?Temp:  [97.1 ?F (36.2 ?C)-98.2 ?F (36.8 ?C)] 98.2 ?F (36.8 ?C) (03/03 0600) ?Pulse Rate:  [74-105] 105 (03/03 0600) ?Resp:  [11-18] 15 (03/02 2000) ?BP: (96-120)/(57-75) 120/63 (03/03 0600) ?SpO2:  [95 %-100 %] 96 % (03/03 0600) ? ?Intake/Output from previous day: ?03/02 0701 - 03/03 0700 ?In: 300 [IV Piggyback:300] ?Out: 150 [Blood:150] ?Intake/Output this shift: ?No intake/output data recorded. ? ?Recent Labs  ?  10/18/21 ?0607  ?HGB 9.7*  ? ?Recent Labs  ?  10/18/21 ?0607  ?WBC 7.1  ?RBC 3.14*  ?HCT 29.9*  ?PLT 108*  ? ?Recent Labs  ?  10/18/21 ?0607  ?NA 140  ?K 4.5  ?CL 108  ?CO2 26  ?BUN 25*  ?CREATININE 1.25*  ?GLUCOSE 122*  ?CALCIUM 9.0  ? ?No results for input(s): LABPT, INR in the last 72 hours. ? ?EXAM ?General - Patient is Alert, Appropriate, and Oriented ?Extremity - Neurovascular intact ?Sensation intact distally ?Intact pulses distally ?Dorsiflexion/Plantar flexion intact ?No cellulitis present ?Compartment soft ?Dressing - dressing C/D/I and no drainage, provena intact with 350 cc drainage ?Motor Function - intact, moving foot and toes well on exam.  ? ?Past Medical History:  ?Diagnosis Date  ? Aortic atherosclerosis ([place])   ? Arthritis   ? Atrial fibrillation ([place])   ? a.) CHA2DS2-VASc = 6 (age x 2, sex, CHF, HTN, aortic plaque). b.) rate/rhythm maintained on oral metoprolol succinate; chronically anticoagulated using dose reduced rivaroxaban d/t CKD.  ? Basal cell carcinoma   ? CHF (congestive heart failure) ([place]) 10/26/2014  ? a.)  TTE 10/26/2014: EF 45%; mild AR/PR; moderate MR/TR. b.) TTE 12/03/2016 mild LV dysfunction with LVH; EF 40%; global HK; severe  LA and moderate RA enlargement; mild AR/PR, moderate MR/TR. c.)  TTE 01/25/2020: EF 45%; global HK; moderate LAE and mild RA enlargement; mild RV enlargement; trivial PR, mild AR, moderate to severe MR, moderate TR.  ? CKD (chronic kidney disease), stage III ([place])   ? Coronary artery calcification seen on CT scan   ? Dementia ([place])   ? a.) on donepezil + apoaequorin  ? Diverticulosis   ? DOE (dyspnea on exertion)   ? Fatty infiltration of liver   ? Full dentures   ? GERD (gastroesophageal reflux disease)   ? Hyperlipidemia   ? Hypertension   ? Hypothyroidism   ? Jaw fracture ([place]) 02/1995  ? a.) BILATERAL subcondylar fractures  ? Long term current use of anticoagulant   ? a.) rivaroxaban; dose reduced d/t CKD  ? Miscarriage   ? OSA (obstructive sleep apnea)   ? a.) does not require nocturnal PAP therapy  ? Osteoporosis   ? Pulmonary nodules   ? Thoracic aortic aneurysm (TAA) 12/14/2015  ? a.) CT 12/14/2015: measured 4.2 cm. b.) CT 11/28/2016: not appreciated by radiologist, however CT reviewed by vascular and 4.2-4.3 cm aneurysmal dilitation noted.  ? Thyroid cyst   ? ? ?Assessment/Plan:   ?1 Day Post-Op Procedure(s) (LRB): ?TOTAL HIP ARTHROPLASTY ANTERIOR APPROACH (Right) ?Principal Problem: ?  Status post total hip replacement, right ? ?Estimated body mass index is 26.95 kg/m? as calculated from the following: ?  Height as of this  encounter: 5\' 6"  (1.676 m). ?  Weight as of this encounter: 75.8 kg. ?Advance diet ?Up with therapy ?Work on [employer] ?Acute post op blood loss anemia - start iron supplement.  ?VSS ?Labs stable ?CM to assist with discharge to home with HHPT pending completion of PT goals ? ?DVT Prophylaxis - Xarelto, TED hose, and SCDs ?Weight-Bearing as tolerated to right leg ? ? ?T. Rachelle Hora, PA-C ?[place] ?10/18/2021, 8:00 AM ?  ?

## 2021-10-18 NOTE — Discharge Summary (Addendum)
Physician Discharge Summary  Patient ID: Brittney Ramirez MRN: [id number] DOB/AGE: [birth year] 81 y.o.  Admit date: 10/17/2021 Discharge date: 10/19/2021  Admission Diagnoses:  Status post total hip replacement, right [Z96.641]   Discharge Diagnoses: Patient Active Problem List   Diagnosis Date Noted   Status post total hip replacement, right 10/17/2021   Inability to ambulate due to right hip 09/19/2020   Weakness 09/18/2020   Cerumen debris on tympanic membrane, right 06/17/2018   UTI (urinary tract infection) 06/17/2018   Multiple rib fractures 01/27/2018   Advanced care planning/counseling discussion 06/11/2017   Thoracic aortic aneurysm without rupture 12/09/2016   Aortic calcification (HCC) 12/09/2016   SOBOE (shortness of breath on exertion) 11/03/2016   Gastroesophageal reflux disease without esophagitis 05/14/2016   CKD (chronic kidney disease) stage 3, GFR 30-59 ml/min (HCC) 05/14/2016   Atrial fibrillation ([place]) 12/05/2015   Hypothyroidism 12/05/2015   Hyperlipidemia 12/05/2015   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 b.) rate/rhythm maintained on oral metoprolol succinate; chronically anticoagulated using dose reduced rivaroxaban d/t CKD.   Basal cell carcinoma    CHF (congestive heart failure) ([place]) 10/26/2014   a.)  TTE 10/26/2014: EF 45%; mild AR/PR; moderate MR/TR. b.) TTE 12/03/2016 mild LV dysfunction with LVH; EF 40%; global HK; severe LA and moderate RA  enlargement; mild AR/PR, moderate MR/TR. c.)  TTE 01/25/2020: EF 45%; global HK; moderate LAE and mild RA enlargement; mild RV enlargement; trivial PR, mild AR, moderate to severe MR, moderate TR.   CKD (chronic kidney disease), stage III (HCC)    Coronary artery calcification seen on CT scan    Dementia (HCC)    a.) on donepezil + apoaequorin   Diverticulosis    DOE (dyspnea on exertion)    Fatty infiltration of liver    Full dentures    GERD (gastroesophageal reflux disease)    Hyperlipidemia    Hypertension    Hypothyroidism    Jaw fracture ([place]) 02/1995   a.) BILATERAL subcondylar fractures   Long term current use of anticoagulant    a.) rivaroxaban; dose reduced d/t CKD   Miscarriage    OSA (obstructive sleep apnea)    a.) does not require nocturnal PAP therapy   Osteoporosis    Pulmonary nodules    Thoracic aortic aneurysm (TAA) 12/14/2015   a.) CT 12/14/2015: measured 4.2 cm. b.) CT 11/28/2016: not appreciated by radiologist, however CT reviewed by vascular and 4.2-4.3 cm aneurysmal dilitation noted.   Thyroid cyst      Transfusion: none   Consultants (if any):   Discharged Condition: Improved  Hospital Course: Brittney Ramirez is an 81 y.o. female who was admitted 10/17/2021 with a diagnosis of Status post total hip replacement, right and went to the operating room on 10/17/2021 and underwent the above named procedures.    Surgeries: Procedure(s): TOTAL HIP ARTHROPLASTY ANTERIOR APPROACH on 10/17/2021 Patient tolerated the surgery well. Taken to PACU where she was stabilized and then transferred to the orthopedic  floor.  Started on Xarelto daily. SCDs applied bilaterally. Heels elevated on bed with rolled towels. No evidence of DVT. Negative [place]. Physical therapy started on day #1 for gait training and transfer. OT started day #1 for ADL and assisted devices.  Patient's foley was d/c on day #1. Patient's IV  was d/c on day #1. Good progress with PT on post op day 1. Acute  post op blood loss anemia , Iron suppment started. VSS  On post op day #2 patient was stable and ready for discharge to home with HHPT.   She was given perioperative antibiotics:  Anti-infectives (From admission, onward)    Start     Dose/Rate Route Frequency Ordered Stop   10/17/21 2115  ceFAZolin (ANCEF) 2-4 GM/100ML-% IVPB       Note to Pharmacy: Sylvester Harder P: cabinet override      10/17/21 2115 10/17/21 2130   10/17/21 1400  ceFAZolin (ANCEF) IVPB 2g/100 mL premix        2 g 200 mL/hr over 30 Minutes Intravenous Every 6 hours 10/17/21 1237 10/17/21 2030   10/17/21 0620  ceFAZolin (ANCEF) 2-4 GM/100ML-% IVPB       Note to Pharmacy: Trudie Reed S: cabinet override      10/17/21 0620 10/17/21 1550   10/17/21 0600  ceFAZolin (ANCEF) IVPB 2g/100 mL premix        2 g 200 mL/hr over 30 Minutes Intravenous On call to O.R. 10/16/21 2254 10/17/21 0735     .  She was given sequential compression devices, early ambulation, and Xarelto TEDs for DVT prophylaxis.  She benefited maximally from the hospital stay and there were no complications.    Recent vital signs:  Vitals:   10/19/21 0436 10/19/21 0800  BP: 102/71 (!) 88/57  Pulse: (!) 58 (!) 114  Resp: 16 16  Temp: 98.1 F (36.7 C) 98.4 F (36.9 C)  SpO2: 96% 95%    Recent laboratory studies:  Lab Results  Component Value Date   HGB 8.9 (L) 10/19/2021   HGB 9.7 (L) 10/18/2021   HGB 13.7 10/03/2021   Lab Results  Component Value Date   WBC 8.2 10/19/2021   PLT 103 (L) 10/19/2021   No results found for: INR Lab Results  Component Value Date   NA 140 10/18/2021   K 4.5 10/18/2021   CL 108 10/18/2021   CO2 26 10/18/2021   BUN 25 (H) 10/18/2021   CREATININE 1.25 (H) 10/18/2021   GLUCOSE 122 (H) 10/18/2021    Discharge Medications:   Allergies as of 10/19/2021       Reactions   Morphine And Related Nausea And Vomiting   Diarrhea   Buprenorphine Hcl Nausea And Vomiting   Diarrhea   Codeine Nausea Only   GI  upset   Desipramine Nausea Only   Fosamax [alendronate] Rash        Medication List     TAKE these medications    acetaminophen 500 MG tablet Commonly known as: TYLENOL Take 2 tablets (1,000 mg total) by mouth every 6 (six) hours. What changed:  when to take this reasons to take this   atorvastatin 40 MG tablet Commonly known as: LIPITOR Take 1 tablet (40 mg total) by mouth at bedtime.   docusate sodium 100 MG capsule Commonly known as: COLACE Take 1 capsule (100 mg total) by mouth 2 (two) times daily.   donepezil 5 MG tablet Commonly known as: ARICEPT Take 5 mg by mouth at bedtime.  erythromycin ophthalmic ointment Place 1 application into the left eye in the morning and at bedtime.   ferrous [id number] C-folic acid capsule Commonly known as: TRINSICON / FOLTRIN Take 1 capsule by mouth 2 (two) times daily after a meal.   ferrous sulfate 325 (65 FE) MG EC tablet Take 325 mg by mouth 2 (two) times a week.   HYDROcodone-acetaminophen 7.5-325 MG tablet Commonly known as: NORCO Take 0.5-1 tablets by mouth every 4 (four) hours as needed for severe pain (pain score 7-10).   latanoprost 0.005 % ophthalmic solution Commonly known as: XALATAN Place 1 drop into both eyes at bedtime.   levothyroxine 88 MCG tablet Commonly known as: SYNTHROID Take 1 tablet (88 mcg total) by mouth daily before breakfast.   methocarbamol 500 MG tablet Commonly known as: ROBAXIN Take 1 tablet (500 mg total) by mouth every 6 (six) hours as needed for muscle spasms.   metoprolol succinate 100 MG 24 hr tablet Commonly known as: TOPROL-XL Take 100 mg by mouth daily.   ondansetron 4 MG tablet Commonly known as: ZOFRAN Take 1 tablet (4 mg total) by mouth every 6 (six) hours as needed for nausea.   polyethylene glycol 17 g packet Commonly known as: MIRALAX / GLYCOLAX Take 17 g by mouth daily as needed for mild constipation.   Prevagen 10 MG Caps Generic drug:  Apoaequorin Take 10 mg by mouth daily.   valsartan-hydrochlorothiazide 160-25 MG tablet Commonly known as: DIOVAN-HCT Take 1 tablet by mouth daily.   vitamin B-12 1000 MCG tablet Commonly known as: CYANOCOBALAMIN Take 1,000 mcg by mouth daily.   Xarelto 15 MG Tabs tablet Generic drug: Rivaroxaban Take 15 mg by mouth daily.               Durable Medical Equipment  (From admission, onward)           Start     Ordered   10/17/21 1238  DME Walker rolling  Once       Question Answer Comment  Walker: With 5 Inch Wheels   Patient needs a walker to treat with the following condition Status post total hip replacement, right      10/17/21 1237   10/17/21 1238  DME 3 n 1  Once        10/17/21 1237   10/17/21 1238  DME Bedside commode  Once       Question:  Patient needs a bedside commode to treat with the following condition  Answer:  Status post total hip replacement, right   10/17/21 1237            Diagnostic Studies: DG C-Arm 1-60 Min-No Report  Result Date: 10/17/2021 Fluoroscopy was utilized by the requesting physician.  No radiographic interpretation.   DG HIP UNILAT WITH PELVIS 1V RIGHT  Result Date: 10/17/2021 CLINICAL DATA:  Total RIGHT hip replacement EXAM: DG HIP (WITH OR WITHOUT PELVIS) 1V RIGHT COMPARISON:  September 18, 2020 FINDINGS: Spot fluoroscopy images were obtained for surgical planning purposes. Status post RIGHT hip arthroplasty. Hardware is in expected alignment. Fluoroscopy time 5 seconds; cumulative air kerma: 0.56 mGy IMPRESSION: Spot fluoroscopy images were obtained for surgical planning purposes. Electronically Signed   By: Valentino Saxon M.D.   On: 10/17/2021 09:42   DG HIP UNILAT W OR W/O PELVIS 2-3 VIEWS RIGHT  Result Date: 10/17/2021 CLINICAL DATA:  Right hip arthroplasty EXAM: DG HIP (WITH OR WITHOUT PELVIS) 2-3V RIGHT COMPARISON:  10/17/2021 FINDINGS: Right hip replacement in satisfactory position and  alignment. Possible fracture  through the greater trochanter. This is obscured by overlying skin staples. Follow-up x-ray suggested. IMPRESSION: Postop right hip replacement Possible fracture greater trochanter.  Follow-up x-rays recommended. Electronically Signed   By: Franchot Gallo M.D.   On: 10/17/2021 10:11    Disposition: Discharge disposition: 06-Home-Health Care Svc          Follow-up Information     Duanne Guess, PA-C Follow up in 2 week(s).   Specialties: Orthopedic Surgery, Emergency Medicine Contact information: [place] [address] [phone]                  Signed: Feliberto Gottron 10/19/2021, 8:48 AM

## 2021-10-18 NOTE — Progress Notes (Signed)
Physical Therapy Treatment ?Patient Details ?Name: Brittney Ramirez ?MRN: [id number] ?DOB: 10/13/1940 ?Today's Date: 10/18/2021 ? ? ?History of Present Illness Pt is 81 y.o. female s/p R THA on 10/17/21.  Pt has PMH including: sleep apnea, former smoker, HTN, CAD, CHF, DOE, psycharitric disorder, GERD, hypothyroidism, renal disease, and arthritis. ? ?  ?PT Comments  ? ? Pt was pleasant and motivated to participate during the session and put forth good effort throughout. Pt required no physical assist with transfers, gait, or stair navigation but did require cuing for general sequencing during the session.  Pt was able to amb 100 feet with slow but steady cadence and demonstrated good control and stability during stair training with spouse present during training. No adverse symptoms noted during the session other than mild to mod R hip pain, unable to obtain a reading with pulse oximeter on either hand.  Pt will benefit from HHPT upon discharge to safely address deficits listed in patient problem list for decreased caregiver assistance and eventual return to PLOF. ? ?   ?Recommendations for follow up therapy are one component of a multi-disciplinary discharge planning process, led by the attending physician.  Recommendations may be updated based on patient status, additional functional criteria and insurance authorization. ? ?Follow Up Recommendations ? Home health PT ?  ?  ?Assistance Recommended at Discharge Intermittent Supervision/Assistance  ?Patient can return home with the following A little help with walking and/or transfers;A little help with bathing/dressing/bathroom;Assistance with cooking/housework;Direct supervision/assist for medications management;Help with stairs or ramp for entrance;Assist for transportation ?  ?Equipment Recommendations ? None recommended by PT  ?  ?Recommendations for Other Services   ? ? ?  ?Precautions / Restrictions Precautions ?Precautions: Anterior Hip ?Precaution Booklet Issued:  No ?Restrictions ?Weight Bearing Restrictions: Yes ?RLE Weight Bearing: Weight bearing as tolerated  ?  ? ?Mobility ? Bed Mobility ?  ?  ?  ?  ?  ?  ?  ?General bed mobility comments: NT, pt in recliner ?  ? ?Transfers ?Overall transfer level: Needs assistance ?Equipment used: Rolling walker (2 wheels) ?Transfers: Sit to/from Stand ?Sit to Stand: Min guard ?  ?  ?  ?  ?  ?General transfer comment: Mod verbal cues for general sequencing ?  ? ?Ambulation/Gait ?Ambulation/Gait assistance: Min guard ?Gait Distance (Feet): 100 Feet x 1, 60 Feet x 1 ?Assistive device: Rolling walker (2 wheels) ?Gait Pattern/deviations: Step-through pattern, Antalgic, Decreased stance time - right, Decreased step length - left, Trunk flexed ?Gait velocity: decreased ?  ?  ?General Gait Details: Mod verbal cues for amb closer to the RW with upright posture ? ? ?Stairs ?Stairs: Yes ?Stairs assistance: Min guard ?Stair Management: Two rails, Step to pattern, Forwards ?Number of Stairs: 4 ?General stair comments: Pt and spouse present for stair training with visual and verbal demonstration/education followed by pt practice. Pt able to ascend/descend 4 steps with good control and stability with mod verbal cues for sequencing. ? ? ?Wheelchair Mobility ?  ? ?Modified Rankin (Stroke Patients Only) ?  ? ? ?  ?Balance Overall balance assessment: Needs assistance ?Sitting-balance support: No upper extremity supported ?Sitting balance-Leahy Scale: Good ?  ?  ?Standing balance support: Bilateral upper extremity supported, During functional activity ?Standing balance-Leahy Scale: Fair ?Standing balance comment: Mod lean on the RW for support ?  ?  ?  ?  ?  ?  ?  ?  ?  ?  ?  ?  ? ?  ?Cognition Arousal/Alertness: Awake/alert ?Behavior During  Therapy: WFL for tasks assessed/performed ?Overall Cognitive Status: Within Functional Limits for tasks assessed ?  ?  ?  ?  ?  ?  ?  ?  ?  ?  ?  ?  ?  ?  ?  ?  ?  ?  ?  ? ?  ?Exercises Total Joint Exercises ?Ankle  Circles/Pumps: AROM, Strengthening, Both, 10 reps ?Quad Sets: Strengthening, Both, 10 reps ?Gluteal Sets: Strengthening, Both, 10 reps ?Hip ABduction/ADduction: AROM, AAROM, Strengthening, Both, 5 reps ?Straight Leg Raises: Strengthening, Both, AAROM, AROM, 5 reps ?Long Arc Quad: AROM, Strengthening, Both, 10 reps ?Knee Flexion: AROM, Strengthening, Both, 10 reps ?Other Exercises ?Other Exercises: Pt/spouse education on car transfer sequencing ?Other Exercises: HEP education for BLE APs, QS, GS, and LAQs x 10 each every 1-2 hours daily ? ?  ?General Comments   ?  ?  ? ?Pertinent Vitals/Pain Pain Assessment ?Pain Assessment: 0-10 ?Pain Score: 3  ?Pain Location: R hip ?Pain Descriptors / Indicators: Sore ?Pain Intervention(s): Repositioned, Ice applied, Premedicated before session, Monitored during session  ? ? ?Home Living Family/patient expects to be discharged to:: Private residence ?Living Arrangements: Spouse/significant other ?Available Help at Discharge: Family;Available 24 hours/day ?Type of Home: House ?Home Access: Stairs to enter ?Entrance Stairs-Rails: Can reach both;Right;Left ?Entrance Stairs-Number of Steps: 3-4 ?  ?Home Layout: One level ?Home Equipment: Conservation officer, nature (2 wheels);Cane - single point;Wheelchair - manual ?   ?  ?Prior Function    ?  ?  ?   ? ?PT Goals (current goals can now be found in the care plan section) Progress towards PT goals: Progressing toward goals ? ?  ?Frequency ? ? ? BID ? ? ? ?  ?PT Plan Current plan remains appropriate  ? ? ?Co-evaluation   ?  ?  ?  ?  ? ?  ?AM-PAC PT "6 Clicks" Mobility   ?Outcome Measure ? Help needed turning from your back to your side while in a flat bed without using bedrails?: A Little ?Help needed moving from lying on your back to sitting on the side of a flat bed without using bedrails?: A Little ?Help needed moving to and from a bed to a chair (including a wheelchair)?: A Little ?Help needed standing up from a chair using your arms (e.g.,  wheelchair or bedside chair)?: A Little ?Help needed to walk in hospital room?: A Little ?Help needed climbing 3-5 steps with a railing? : A Little ?6 Click Score: 18 ? ?  ?End of Session Equipment Utilized During Treatment: Gait belt ?Activity Tolerance: Patient tolerated treatment well ?Patient left: with call bell/phone within reach;with family/visitor present;in chair ?Nurse Communication: Mobility status ?PT Visit Diagnosis: Unsteadiness on feet (R26.81);Muscle weakness (generalized) (M62.81);Difficulty in walking, not elsewhere classified (R26.2);Pain ?Pain - Right/Left: Right ?Pain - part of body: Hip ?  ? ? ?Time: 8657-8469 ?PT Time Calculation (min) (ACUTE ONLY): 41 min ? ?Charges:  $Gait Training: 23-37 mins ?$Therapeutic Exercise: 8-22 mins          ?          ? ?D. Royetta Asal PT, DPT ?10/18/21, 11:02 AM ? ? ?

## 2021-10-18 NOTE — Evaluation (Signed)
Occupational Therapy Evaluation Patient Details Name: Brittney Ramirez MRN: [id number] DOB: 07-23-1941 Today's Date: 10/18/2021   History of Present Illness Pt is 81 y.o. female s/p R THA on 10/17/21.  Pt has PMH including: sleep apnea, former smoker, HTN, CAD, CHF, DOE, psycharitric disorder, GERD, hypothyroidism, renal disease, and arthritis.   Clinical Impression   Pt seen for OT evaluation today in setting of acute hospital stay s/p elective L THA (anterior). She presents this date with decreased fxl activity tolerance and some expected post-op pain. She is INDEP at baseline with fxl mobility and most basic self care per spouse, but requires assist for IADLs d/t cognitive status. She currently requires: SEUTP for UB ADLs, MIN A for seated LB ADLs, MOD A for compression stockings-spouse educated on assisting patient. CGA for ADL transfers with RW with minimal cues for hand placement. Pt able to take ~6-7 steps with RW with CGA from bed to recliner and demos good control with tactile/verbal cues to reach back and use arm rests for stand to sit. She is left with all needs met and in reach. Pt and spouse ed re: fall prevention, compression stocking mgt, and LB ADL task modification. Pt's spouse with good understanding and handout issued. Pt will require ongoing education, recommend [facility] f/u services.      Recommendations for follow up therapy are one component of a multi-disciplinary discharge planning process, led by the attending physician.  Recommendations may be updated based on patient status, additional functional criteria and insurance authorization.   Follow Up Recommendations  Home health OT    Assistance Recommended at Discharge Frequent or constant Supervision/Assistance  Patient can return home with the following A little help with walking and/or transfers;A little help with bathing/dressing/bathroom;Assistance with cooking/housework;Assist for transportation;Help with stairs or ramp for  entrance    Functional Status Assessment  Patient has had a recent decline in their functional status and demonstrates the ability to make significant improvements in function in a reasonable and predictable amount of time.  Equipment Recommendations  None recommended by OT (has BSC and RW at home already)    Recommendations for Other Services       Precautions / Restrictions Precautions Precautions: Anterior Hip Precaution Booklet Issued: No Restrictions Weight Bearing Restrictions: Yes RLE Weight Bearing: Weight bearing as tolerated      Mobility Bed Mobility Overal bed mobility: Needs Assistance Bed Mobility: Supine to Sit, Sit to Supine     Supine to sit: Min guard, Min assist     General bed mobility comments: increased time, cues for use of bed rails, slight assist to reduce friction from sock in post-op R LE    Transfers Overall transfer level: Needs assistance Equipment used: Rolling walker (2 wheels) Transfers: Sit to/from Stand Sit to Stand: Min guard           General transfer comment: cues for seuqence/safety.      Balance Overall balance assessment: Needs assistance Sitting-balance support: No upper extremity supported Sitting balance-Leahy Scale: Good     Standing balance support: Bilateral upper extremity supported, During functional activity Standing balance-Leahy Scale: Fair                             ADL either performed or assessed with clinical judgement   ADL Overall ADL's : Needs assistance/impaired  General ADL Comments: SEUTP for UB ADLs, MIN A for seated LB ADLs, MOD A for compression stockings-spouse educated on assisting patient. CGA for ADL transfers with RW with minimal cues for hand placement.     Vision Patient Visual Report: No change from baseline       Perception     Praxis      Pertinent Vitals/Pain Pain Assessment Pain Assessment: 0-10 Pain  Score: 5  Pain Location: R hip at incision site. Pain Descriptors / Indicators: Burning Pain Intervention(s): Limited activity within patient's tolerance, Monitored during session     Hand Dominance Right   Extremity/Trunk Assessment Upper Extremity Assessment Upper Extremity Assessment: Overall WFL for tasks assessed;Generalized weakness (ROM WFL, MMT grossly 4-/5)   Lower Extremity Assessment Lower Extremity Assessment: RLE deficits/detail;LLE deficits/detail RLE Deficits / Details: somewhat limited post-op'ly with hip flexion as it pertains to LB ADLs RLE Sensation: WNL (somewhat difficult to assess d/t patient cognition) LLE Deficits / Details: ROM WFL, MMT grossly 4-/5       Communication Communication Communication: No difficulties   Cognition Arousal/Alertness: Awake/alert Behavior During Therapy: WFL for tasks assessed/performed Overall Cognitive Status: History of cognitive impairments - at baseline                                 General Comments: spouse vaguely suggests that pt is not always mentally clear. She is able to follow all commands, but not oriented to time, place, or all aspects of situations. Ex: mentions her cardiologist saw her this morning "Nehemiah Massed" (not accurate) even though she is here for an orthopedic surgery.     General Comments       Exercises Other Exercises Other Exercises: OT ed pt/spouse re: use of RW, compression stocking mgt, safety considerations, post-op precations, fall prevention.   Shoulder Instructions      Home Living Family/patient expects to be discharged to:: Private residence Living Arrangements: Spouse/significant other Available Help at Discharge: Family;Available 24 hours/day Type of Home: House Home Access: Stairs to enter [employer] of Steps: 3-4 Entrance Stairs-Rails: Can reach both;Right;Left Home Layout: One level     Bathroom Shower/Tub: Teacher, early years/pre:  Handicapped height     Home Equipment: Conservation officer, nature (2 wheels);Cane - single point;Wheelchair - manual          Prior Functioning/Environment Prior Level of Function : Independent/Modified Independent;Needs assist  Cognitive Assist : ADLs (cognitive)             ADLs Comments: spouse assist for IADLs d/t cognition        OT Problem List: Decreased strength;Decreased activity tolerance      OT Treatment/Interventions: Self-care/ADL training;Therapeutic exercise;Therapeutic activities;DME and/or AE instruction    OT Goals(Current goals can be found in the care plan section) Acute Rehab OT Goals Patient Stated Goal: to go home OT Goal Formulation: With patient/family Time For Goal Achievement: 11/01/21 Potential to Achieve Goals: Good ADL Goals Pt Will Perform Lower Body Dressing: with supervision;with set-up;sit to/from stand;with adaptive equipment Pt Will Transfer to Toilet: with supervision;ambulating;grab bars Pt Will Perform Toileting - Clothing Manipulation and hygiene: with supervision;sit to/from stand  OT Frequency: Min 2X/week    Co-evaluation              AM-PAC OT "6 Clicks" Daily Activity     Outcome Measure Help from another person eating meals?: None Help from another person taking care of personal grooming?:  A Little Help from another person toileting, which includes using toliet, bedpan, or urinal?: A Little Help from another person bathing (including washing, rinsing, drying)?: A Little Help from another person to put on and taking off regular upper body clothing?: A Little Help from another person to put on and taking off regular lower body clothing?: A Little 6 Click Score: 19   End of Session Equipment Utilized During Treatment: Gait belt;Rolling walker (2 wheels) Nurse Communication: Mobility status  Activity Tolerance: Patient tolerated treatment well Patient left: in chair;with call bell/phone within reach;with family/visitor  present  OT Visit Diagnosis: Unsteadiness on feet (R26.81);Muscle weakness (generalized) (M62.81)                Time: 0109-3235 OT Time Calculation (min): 28 min Charges:  OT General Charges $OT Visit: 1 Visit OT Evaluation $OT Eval Moderate Complexity: 1 Mod OT Treatments $Self Care/Home Management : 8-22 mins  Gerrianne Scale, MS, OTR/L ascom [phone] 10/18/21, 11:52 AM

## 2021-10-18 NOTE — Plan of Care (Signed)

## 2021-10-18 NOTE — Discharge Instructions (Signed)
ANTERIOR APPROACH TOTAL HIP REPLACEMENT POSTOPERATIVE DIRECTIONS   Hip Rehabilitation, Guidelines Following Surgery  The results of a hip operation are greatly improved after range of motion and muscle strengthening exercises. Follow all safety measures which are given to protect your hip. If any of these exercises cause increased pain or swelling in your joint, decrease the amount until you are comfortable again. Then slowly increase the exercises. Call your caregiver if you have problems or questions.   HOME CARE INSTRUCTIONS  Remove items at home which could result in a fall. This includes throw rugs or furniture in walking pathways.  ICE to the affected hip every three hours for 30 minutes at a time and then as needed for pain and swelling.  Continue to use ice on the hip for pain and swelling from surgery. You may notice swelling that will progress down to the foot and ankle.  This is normal after surgery.  Elevate the leg when you are not up walking on it.   Continue to use the breathing machine which will help keep your temperature down.  It is common for your temperature to cycle up and down following surgery, especially at night when you are not up moving around and exerting yourself.  The breathing machine keeps your lungs expanded and your temperature down. Do not place pillow under knee, focus on keeping the knee straight while resting  DIET You may resume your previous home diet once your are discharged from the hospital.  DRESSING / WOUND CARE / SHOWERING Please remove provena negative pressure dressing on 10/25/2021 and apply honey comb dressing. Keep dressing clean and dry at all times.   ACTIVITY Walk with your walker as instructed. Use walker as long as suggested by your caregivers. Avoid periods of inactivity such as sitting longer than an hour when not asleep. This helps prevent blood clots.  You may resume a sexual relationship in one month or when given the OK by your  doctor.  You may return to work once you are cleared by your doctor.  Do not drive a car for 6 weeks or until released by you surgeon.  Do not drive while taking narcotics.  WEIGHT BEARING Weight bearing as tolerated. Use walker/cane as needed for at least 4 weeks post op.  POSTOPERATIVE CONSTIPATION PROTOCOL Constipation - defined medically as fewer than three stools per week and severe constipation as less than one stool per week.  One of the most common issues patients have following surgery is constipation.  Even if you have a regular bowel pattern at home, your normal regimen is likely to be disrupted due to multiple reasons following surgery.  Combination of anesthesia, postoperative narcotics, change in appetite and fluid intake all can affect your bowels.  In order to avoid complications following surgery, here are some recommendations in order to help you during your recovery period.  Colace (docusate) - Pick up an over-the-counter form of Colace or another stool softener and take twice a day as long as you are requiring postoperative pain medications.  Take with a full glass of water daily.  If you experience loose stools or diarrhea, hold the colace until you stool forms back up.  If your symptoms do not get better within 1 week or if they get worse, check with your doctor.  Dulcolax (bisacodyl) - Pick up over-the-counter and take as directed by the product packaging as needed to assist with the movement of your bowels.  Take with a full glass of  water.  Use this product as needed if not relieved by Colace only.  ° °MiraLax (polyethylene glycol) - Pick up over-the-counter to have on hand.  MiraLax is a solution that will increase the amount of water in your bowels to assist with bowel movements.  Take as directed and can mix with a glass of water, juice, soda, coffee, or tea.  Take if you go more than two days without a movement. °Do not use MiraLax more than once per day. Call your doctor  if you are still constipated or irregular after using this medication for 7 days in a row. ° °If you continue to have problems with postoperative constipation, please contact the office for further assistance and recommendations.  If you experience "the worst abdominal pain ever" or develop nausea or vomiting, please contact the office immediatly for further recommendations for treatment. ° °ITCHING ° If you experience itching with your medications, try taking only a single pain pill, or even half a pain pill at a time.  You can also use Benadryl over the counter for itching or also to help with sleep.  ° °TED HOSE STOCKINGS °Wear the elastic stockings on both legs for six weeks following surgery during the day but you may remove then at night for sleeping. ° °MEDICATIONS °See your medication summary on the “After Visit Summary” that the nursing staff will review with you prior to discharge.  You may have some home medications which will be placed on hold until you complete the course of blood thinner medication.  It is important for you to complete the blood thinner medication as prescribed by your surgeon.  Continue your approved medications as instructed at time of discharge. ° °PRECAUTIONS °If you experience chest pain or shortness of breath - call 911 immediately for transfer to the hospital emergency department.  °If you develop a fever greater that 101 F, purulent drainage from wound, increased redness or drainage from wound, foul odor from the wound/dressing, or calf pain - CONTACT YOUR SURGEON.   °                                                °FOLLOW-UP APPOINTMENTS °Make sure you keep all of your appointments after your operation with your surgeon and caregivers. You should call the office at the above phone number and make an appointment for approximately two weeks after the date of your surgery or on the date instructed by your surgeon outlined in the "After Visit Summary". ° °RANGE OF MOTION AND  STRENGTHENING EXERCISES  °These exercises are designed to help you keep full movement of your hip joint. Follow your caregiver's or physical therapist's instructions. Perform all exercises about fifteen times, three times per day or as directed. Exercise both hips, even if you have had only one joint replacement. These exercises can be done on a training (exercise) mat, on the floor, on a table or on a bed. Use whatever works the best and is most comfortable for you. Use music or television while you are exercising so that the exercises are a pleasant break in your day. This will make your life better with the exercises acting as a break in routine you can look forward to.  °Lying on your back, slowly slide your foot toward your buttocks, raising your knee up off the floor. Then slowly slide your   foot back down until your leg is straight again.  °Lying on your back spread your legs as far apart as you can without causing discomfort.  °Lying on your side, raise your upper leg and foot straight up from the floor as far as is comfortable. Slowly lower the leg and repeat.  °Lying on your back, tighten up the muscle in the front of your thigh (quadriceps muscles). You can do this by keeping your leg straight and trying to raise your heel off the floor. This helps strengthen the largest muscle supporting your knee.  °Lying on your back, tighten up the muscles of your buttocks both with the legs straight and with the knee bent at a comfortable angle while keeping your heel on the floor.  ° °IF YOU ARE TRANSFERRED TO A SKILLED REHAB FACILITY °If the patient is transferred to a skilled rehab facility following release from the hospital, a list of the current medications will be sent to the facility for the patient to continue.  When discharged from the skilled rehab facility, please have the facility set up the patient's Home Health Physical Therapy prior to being released. Also, the skilled facility will be responsible for  providing the patient with their medications at time of release from the facility to include their pain medication, the muscle relaxants, and their blood thinner medication. If the patient is still at the rehab facility at time of the two week follow up appointment, the skilled rehab facility will also need to assist the patient in arranging follow up appointment in our office and any transportation needs. ° °MAKE SURE YOU:  °Understand these instructions.  °Get help right away if you are not doing well or get worse.  ° ° °Pick up stool softner and laxative for home use following surgery while on pain medications. °Continue to use ice for pain and swelling after surgery. °Do not use any lotions or creams on the incision until instructed by your surgeon. ° °

## 2021-10-19 DIAGNOSIS — E039 Hypothyroidism, unspecified: Secondary | ICD-10-CM | POA: Diagnosis not present

## 2021-10-19 DIAGNOSIS — Z7901 Long term (current) use of anticoagulants: Secondary | ICD-10-CM | POA: Diagnosis not present

## 2021-10-19 DIAGNOSIS — I5022 Chronic systolic (congestive) heart failure: Secondary | ICD-10-CM | POA: Diagnosis not present

## 2021-10-19 DIAGNOSIS — M1611 Unilateral primary osteoarthritis, right hip: Secondary | ICD-10-CM | POA: Diagnosis not present

## 2021-10-19 DIAGNOSIS — Z96653 Presence of artificial knee joint, bilateral: Secondary | ICD-10-CM | POA: Diagnosis not present

## 2021-10-19 DIAGNOSIS — N183 Chronic kidney disease, stage 3 unspecified: Secondary | ICD-10-CM | POA: Diagnosis not present

## 2021-10-19 DIAGNOSIS — I130 Hypertensive heart and chronic kidney disease with heart failure and stage 1 through stage 4 chronic kidney disease, or unspecified chronic kidney disease: Secondary | ICD-10-CM | POA: Diagnosis not present

## 2021-10-19 DIAGNOSIS — I4891 Unspecified atrial fibrillation: Secondary | ICD-10-CM | POA: Diagnosis not present

## 2021-10-19 DIAGNOSIS — F0390 Unspecified dementia without behavioral disturbance: Secondary | ICD-10-CM | POA: Diagnosis not present

## 2021-10-19 LAB — CBC
HCT: 27.5 % — ABNORMAL LOW (ref 36.0–46.0)
Hemoglobin: 8.9 g/dL — ABNORMAL LOW (ref 12.0–15.0)
MCH: 30.9 pg (ref 26.0–34.0)
MCHC: 32.4 g/dL (ref 30.0–36.0)
MCV: 95.5 fL (ref 80.0–100.0)
Platelets: 103 10*3/uL — ABNORMAL LOW (ref 150–400)
RBC: 2.88 MIL/uL — ABNORMAL LOW (ref 3.87–5.11)
RDW: 14 % (ref 11.5–15.5)
WBC: 8.2 10*3/uL (ref 4.0–10.5)
nRBC: 0 % (ref 0.0–0.2)

## 2021-10-19 MED ORDER — HYDROCODONE-ACETAMINOPHEN 7.5-325 MG PO TABS: 0.5000 | ORAL_TABLET | ORAL | Status: DC | PRN

## 2021-10-19 MED ORDER — ACETAMINOPHEN 500 MG PO TABS: 1000.0000 mg | ORAL_TABLET | Freq: Four times a day (QID) | ORAL | Status: DC

## 2021-10-19 MED ORDER — HYDROCHLOROTHIAZIDE 25 MG PO TABS
25.0000 mg | ORAL_TABLET | Freq: Every day | ORAL | Status: DC
Start: 2021-10-20 — End: 2021-10-19

## 2021-10-19 MED ORDER — IRBESARTAN 150 MG PO TABS
150.0000 mg | ORAL_TABLET | Freq: Every day | ORAL | Status: DC
  Administered 2021-10-19: 150 mg via ORAL
  Filled 2021-10-19: qty 1

## 2021-10-19 MED ORDER — ACETAMINOPHEN 500 MG PO TABS: 1000.0000 mg | ORAL_TABLET | Freq: Four times a day (QID) | ORAL | 0 refills | Status: AC

## 2021-10-19 NOTE — Progress Notes (Signed)
? ?Subjective: ?2 Days Post-Op Procedure(s) (LRB): ?TOTAL HIP ARTHROPLASTY ANTERIOR APPROACH (Right) ?Patient reports pain as mild.   ?Patient is well, and has had no acute complaints or problems ?Denies any CP, SOB, ABD pain. ?We will continue therapy today.  ?Plan is to go Home after hospital stay. ? ?Objective: ?Vital signs in last 24 hours: ?Temp:  [97.9 ?F (36.6 ?C)-98.4 ?F (36.9 ?C)] 98.1 ?F (36.7 ?C) (03/04 0436) ?Pulse Rate:  [58-102] 58 (03/04 0436) ?Resp:  [15-18] 16 (03/04 0436) ?BP: (101-113)/(63-75) 102/71 (03/04 0436) ?SpO2:  [92 %-96 %] 96 % (03/04 0436) ? ?Intake/Output from previous day: ?03/03 0701 - 03/04 0700 ?In: 1206.7 [P.O.:240; I.V.:966.7] ?Out: 250 [Drains:250] ?Intake/Output this shift: ?No intake/output data recorded. ? ?Recent Labs  ?  10/18/21 ?0607 10/19/21 ?2703  ?HGB 9.7* 8.9*  ? ?Recent Labs  ?  10/18/21 ?0607 10/19/21 ?5009  ?WBC 7.1 8.2  ?RBC 3.14* 2.88*  ?HCT 29.9* 27.5*  ?PLT 108* 103*  ? ?Recent Labs  ?  10/18/21 ?0607  ?NA 140  ?K 4.5  ?CL 108  ?CO2 26  ?BUN 25*  ?CREATININE 1.25*  ?GLUCOSE 122*  ?CALCIUM 9.0  ? ?No results for input(s): LABPT, INR in the last 72 hours. ? ?EXAM ?General - Patient is Alert, Appropriate, and Oriented ?Extremity - Neurovascular intact ?Sensation intact distally ?Intact pulses distally ?Dorsiflexion/Plantar flexion intact ?No cellulitis present ?Compartment soft ?Dressing - dressing C/D/I and no drainage, provena intact with 350 cc drainage, no new drainage ?Motor Function - intact, moving foot and toes well on exam.  ? ?Past Medical History:  ?Diagnosis Date  ? Aortic atherosclerosis ([place])   ? Arthritis   ? Atrial fibrillation ([place])   ? a.) CHA2DS2-VASc = 6 (age x 2, sex, CHF, HTN, aortic plaque). b.) rate/rhythm maintained on oral metoprolol succinate; chronically anticoagulated using dose reduced rivaroxaban d/t CKD.  ? Basal cell carcinoma   ? CHF (congestive heart failure) ([place]) 10/26/2014  ? a.)  TTE 10/26/2014: EF 45%; mild AR/PR; moderate  MR/TR. b.) TTE 12/03/2016 mild LV dysfunction with LVH; EF 40%; global HK; severe LA and moderate RA enlargement; mild AR/PR, moderate MR/TR. c.)  TTE 01/25/2020: EF 45%; global HK; moderate LAE and mild RA enlargement; mild RV enlargement; trivial PR, mild AR, moderate to severe MR, moderate TR.  ? CKD (chronic kidney disease), stage III ([place])   ? Coronary artery calcification seen on CT scan   ? Dementia ([place])   ? a.) on donepezil + apoaequorin  ? Diverticulosis   ? DOE (dyspnea on exertion)   ? Fatty infiltration of liver   ? Full dentures   ? GERD (gastroesophageal reflux disease)   ? Hyperlipidemia   ? Hypertension   ? Hypothyroidism   ? Jaw fracture ([place]) 02/1995  ? a.) BILATERAL subcondylar fractures  ? Long term current use of anticoagulant   ? a.) rivaroxaban; dose reduced d/t CKD  ? Miscarriage   ? OSA (obstructive sleep apnea)   ? a.) does not require nocturnal PAP therapy  ? Osteoporosis   ? Pulmonary nodules   ? Thoracic aortic aneurysm (TAA) 12/14/2015  ? a.) CT 12/14/2015: measured 4.2 cm. b.) CT 11/28/2016: not appreciated by radiologist, however CT reviewed by vascular and 4.2-4.3 cm aneurysmal dilitation noted.  ? Thyroid cyst   ? ? ?Assessment/Plan:   ?2 Days Post-Op Procedure(s) (LRB): ?TOTAL HIP ARTHROPLASTY ANTERIOR APPROACH (Right) ?Principal Problem: ?  Status post total hip replacement, right ? ?Estimated body mass index is 26.95  kg/m? as calculated from the following: ?  Height as of this encounter: '5\' 6"'$  (1.676 m). ?  Weight as of this encounter: 75.8 kg. ?Advance diet ?Up with therapy ?+ BM ?Acute post op blood loss anemia -Hgb 8.9, continue with Fe supplement ?VSS ?Labs stable ?CM to assist with discharge to home with HHPT today pending safe completion of PT goals ? ?DVT Prophylaxis - Xarelto, TED hose, and SCDs ?Weight-Bearing as tolerated to right leg ? ? ?T. Rachelle Hora, PA-C ?[place] ?10/19/2021, 8:38 AM ?  ?

## 2021-10-19 NOTE — Progress Notes (Signed)
Wound vac changed to Prevena. Patient and Husband educated on switching canister when full. ?

## 2021-10-19 NOTE — Progress Notes (Signed)
Physical Therapy Treatment ?Patient Details ?Name: Brittney Ramirez ?MRN: [id number] ?DOB: 08/02/1941 ?Today's Date: 10/19/2021 ? ? ?History of Present Illness Pt is 81 y.o. female s/p R THA on 10/17/21.  Pt has PMH including: sleep apnea, former smoker, HTN, CAD, CHF, DOE, psycharitric disorder, GERD, hypothyroidism, renal disease, and arthritis. ? ?  ?PT Comments  ? ? Pt was sitting in recliner with supportive spouse present. Per spouse," Her cognition is much better without taking that medicine." Both pt and spouse endorse feeling ready to DC home today. Pt did stand to RW, ambulate ~ 75 ft with CGA and perform stairs. Spouse present throughout session and will be assisting pt 24/7 at DC. Returned to room and reviewed all expectations and equipment needed for success at home. Acute PT does recommend DC home with HHPT to follow. Cleared for safety DC home this date from PT standpoint. ?  ?Recommendations for follow up therapy are one component of a multi-disciplinary discharge planning process, led by the attending physician.  Recommendations may be updated based on patient status, additional functional criteria and insurance authorization. ? ?Follow Up Recommendations ? Home health PT ?  ?  ?Assistance Recommended at Discharge Frequent or constant Supervision/Assistance  ?Patient can return home with the following A little help with walking and/or transfers;A little help with bathing/dressing/bathroom;Assistance with cooking/housework;Direct supervision/assist for medications management;Help with stairs or ramp for entrance;Assist for transportation ?  ?Equipment Recommendations ? None recommended by PT  ?  ?   ?Precautions / Restrictions Precautions ?Precautions: Anterior Hip ?Precaution Booklet Issued: Yes (comment) ?Restrictions ?Weight Bearing Restrictions: Yes ?RLE Weight Bearing: Weight bearing as tolerated  ?  ? ?Mobility ? Bed Mobility ?   ?General bed mobility comments: pt was in recliner pre/post session ?   ? ?Transfers ?Overall transfer level: Needs assistance ?Equipment used: Rolling walker (2 wheels) ?Transfers: Sit to/from Stand ?Sit to Stand: Min guard ?  ?  ?Ambulation/Gait ?Ambulation/Gait assistance: Min guard ?Gait Distance (Feet): 75 Feet ?Assistive device: Rolling walker (2 wheels) ?Gait Pattern/deviations: Antalgic, Decreased stance time - right, Decreased step length - left, Trunk flexed, Step-to pattern ?Gait velocity: decreased ?  ?  ?General Gait Details: pt was able to tolerate ambulation to rehab gym and return. Does require some Vcs for posture correction however overall no LOB or unsteadiness. antalgic gait pattern ? ? ?Stairs ?Stairs: Yes ?Stairs assistance: Min guard ?Stair Management: One rail Right, Sideways ?Number of Stairs: 4 ?  ? ?  ?Balance Overall balance assessment: Needs assistance ?Sitting-balance support: No upper extremity supported ?Sitting balance-Leahy Scale: Good ?  ?  ?Standing balance support: Bilateral upper extremity supported, During functional activity ?Standing balance-Leahy Scale: Fair ?  ?  ?  ?Cognition Arousal/Alertness: Awake/alert ?Behavior During Therapy: [facility] for tasks assessed/performed ?Overall Cognitive Status: History of cognitive impairments - at baseline ?  ?  ? General Comments: Pt is alert and per spouse, cognition has returned to baseline ?  ?  ? ?  ?   ?   ? ?Pertinent Vitals/Pain Pain Assessment ?Pain Assessment: 0-10 ?Pain Score: 4  ?Pain Location: R hip ?Pain Descriptors / Indicators: Sore, Aching ?Pain Intervention(s): Limited activity within patient's tolerance, Monitored during session, Premedicated before session, Repositioned  ? ? ? ?PT Goals (current goals can now be found in the care plan section) Acute Rehab PT Goals ?Patient Stated Goal: go home ?Progress towards PT goals: Progressing toward goals ? ?  ?Frequency ? ? ? BID ? ? ? ?  ?PT Plan Current  plan remains appropriate  ? ? ?   ?AM-PAC PT "6 Clicks" Mobility   ?Outcome Measure ? Help needed  turning from your back to your side while in a flat bed without using bedrails?: A Little ?Help needed moving from lying on your back to sitting on the side of a flat bed without using bedrails?: A Little ?Help needed moving to and from a bed to a chair (including a wheelchair)?: A Little ?Help needed standing up from a chair using your arms (e.g., wheelchair or bedside chair)?: A Little ?Help needed to walk in hospital room?: A Little ?Help needed climbing 3-5 steps with a railing? : A Little ?6 Click Score: 18 ? ?  ?End of Session Equipment Utilized During Treatment: Gait belt ?Activity Tolerance: Patient limited by fatigue;Patient limited by pain ?Patient left: with call bell/phone within reach;with family/visitor present;in chair ?Nurse Communication: Mobility status ?PT Visit Diagnosis: Unsteadiness on feet (R26.81);Muscle weakness (generalized) (M62.81);Difficulty in walking, not elsewhere classified (R26.2);Pain ?Pain - Right/Left: Right ?Pain - part of body: Hip ?  ? ? ?Time: 6546-5035 ?PT Time Calculation (min) (ACUTE ONLY): 23 min ? ?Charges:  $Gait Training: 8-22 mins ?$Therapeutic Activity: 8-22 mins          ?          ? ?Julaine Fusi PTA ?10/19/21, 12:44 PM  ? ?

## 2021-11-24 ENCOUNTER — Other Ambulatory Visit: Payer: Self-pay

## 2021-11-24 ENCOUNTER — Encounter: Payer: Self-pay | Admitting: Intensive Care

## 2021-11-24 ENCOUNTER — Inpatient Hospital Stay
Admission: EM | Admit: 2021-11-24 | Discharge: 2021-12-06 | DRG: 291 | Disposition: A | Discharge status: Hospice | Payer: Medicare HMO | Attending: Internal Medicine | Admitting: Internal Medicine

## 2021-11-24 ENCOUNTER — Observation Stay: Payer: Medicare HMO

## 2021-11-24 ENCOUNTER — Emergency Department: Payer: Medicare HMO

## 2021-11-24 DIAGNOSIS — G309 Alzheimer's disease, unspecified: Secondary | ICD-10-CM | POA: Diagnosis present

## 2021-11-24 DIAGNOSIS — N179 Acute kidney failure, unspecified: Secondary | ICD-10-CM | POA: Diagnosis not present

## 2021-11-24 DIAGNOSIS — R0602 Shortness of breath: Secondary | ICD-10-CM | POA: Diagnosis not present

## 2021-11-24 DIAGNOSIS — M810 Age-related osteoporosis without current pathological fracture: Secondary | ICD-10-CM | POA: Diagnosis present

## 2021-11-24 DIAGNOSIS — I639 Cerebral infarction, unspecified: Secondary | ICD-10-CM | POA: Diagnosis not present

## 2021-11-24 DIAGNOSIS — F0390 Unspecified dementia without behavioral disturbance: Secondary | ICD-10-CM | POA: Diagnosis not present

## 2021-11-24 DIAGNOSIS — Z833 Family history of diabetes mellitus: Secondary | ICD-10-CM

## 2021-11-24 DIAGNOSIS — I5033 Acute on chronic diastolic (congestive) heart failure: Secondary | ICD-10-CM | POA: Diagnosis not present

## 2021-11-24 DIAGNOSIS — I480 Paroxysmal atrial fibrillation: Secondary | ICD-10-CM | POA: Diagnosis not present

## 2021-11-24 DIAGNOSIS — D509 Iron deficiency anemia, unspecified: Secondary | ICD-10-CM | POA: Diagnosis not present

## 2021-11-24 DIAGNOSIS — Z7189 Other specified counseling: Secondary | ICD-10-CM | POA: Diagnosis not present

## 2021-11-24 DIAGNOSIS — Z85828 Personal history of other malignant neoplasm of skin: Secondary | ICD-10-CM

## 2021-11-24 DIAGNOSIS — K760 Fatty (change of) liver, not elsewhere classified: Secondary | ICD-10-CM | POA: Diagnosis present

## 2021-11-24 DIAGNOSIS — Z20822 Contact with and (suspected) exposure to covid-19: Secondary | ICD-10-CM | POA: Diagnosis not present

## 2021-11-24 DIAGNOSIS — I482 Chronic atrial fibrillation, unspecified: Secondary | ICD-10-CM | POA: Diagnosis present

## 2021-11-24 DIAGNOSIS — R531 Weakness: Secondary | ICD-10-CM | POA: Diagnosis not present

## 2021-11-24 DIAGNOSIS — Z885 Allergy status to narcotic agent status: Secondary | ICD-10-CM

## 2021-11-24 DIAGNOSIS — Z96643 Presence of artificial hip joint, bilateral: Secondary | ICD-10-CM | POA: Diagnosis present

## 2021-11-24 DIAGNOSIS — D696 Thrombocytopenia, unspecified: Secondary | ICD-10-CM | POA: Diagnosis not present

## 2021-11-24 DIAGNOSIS — Z452 Encounter for adjustment and management of vascular access device: Secondary | ICD-10-CM | POA: Diagnosis not present

## 2021-11-24 DIAGNOSIS — N390 Urinary tract infection, site not specified: Secondary | ICD-10-CM | POA: Diagnosis not present

## 2021-11-24 DIAGNOSIS — J189 Pneumonia, unspecified organism: Principal | ICD-10-CM

## 2021-11-24 DIAGNOSIS — I10 Essential (primary) hypertension: Secondary | ICD-10-CM | POA: Diagnosis not present

## 2021-11-24 DIAGNOSIS — J690 Pneumonitis due to inhalation of food and vomit: Secondary | ICD-10-CM | POA: Diagnosis not present

## 2021-11-24 DIAGNOSIS — E785 Hyperlipidemia, unspecified: Secondary | ICD-10-CM | POA: Diagnosis present

## 2021-11-24 DIAGNOSIS — Z8249 Family history of ischemic heart disease and other diseases of the circulatory system: Secondary | ICD-10-CM

## 2021-11-24 DIAGNOSIS — E870 Hyperosmolality and hypernatremia: Secondary | ICD-10-CM | POA: Diagnosis not present

## 2021-11-24 DIAGNOSIS — M1990 Unspecified osteoarthritis, unspecified site: Secondary | ICD-10-CM | POA: Diagnosis present

## 2021-11-24 DIAGNOSIS — I130 Hypertensive heart and chronic kidney disease with heart failure and stage 1 through stage 4 chronic kidney disease, or unspecified chronic kidney disease: Secondary | ICD-10-CM | POA: Diagnosis not present

## 2021-11-24 DIAGNOSIS — Z79890 Hormone replacement therapy (postmenopausal): Secondary | ICD-10-CM

## 2021-11-24 DIAGNOSIS — E861 Hypovolemia: Secondary | ICD-10-CM | POA: Diagnosis not present

## 2021-11-24 DIAGNOSIS — Z4682 Encounter for fitting and adjustment of non-vascular catheter: Secondary | ICD-10-CM | POA: Diagnosis not present

## 2021-11-24 DIAGNOSIS — F03B0 Unspecified dementia, moderate, without behavioral disturbance, psychotic disturbance, mood disturbance, and anxiety: Secondary | ICD-10-CM | POA: Diagnosis not present

## 2021-11-24 DIAGNOSIS — I517 Cardiomegaly: Secondary | ICD-10-CM | POA: Diagnosis not present

## 2021-11-24 DIAGNOSIS — I700 Atherosclerosis of aorta: Secondary | ICD-10-CM | POA: Diagnosis present

## 2021-11-24 DIAGNOSIS — R600 Localized edema: Secondary | ICD-10-CM | POA: Diagnosis not present

## 2021-11-24 DIAGNOSIS — K219 Gastro-esophageal reflux disease without esophagitis: Secondary | ICD-10-CM | POA: Diagnosis present

## 2021-11-24 DIAGNOSIS — M79605 Pain in left leg: Secondary | ICD-10-CM | POA: Diagnosis not present

## 2021-11-24 DIAGNOSIS — F0280 Dementia in other diseases classified elsewhere without behavioral disturbance: Secondary | ICD-10-CM | POA: Diagnosis present

## 2021-11-24 DIAGNOSIS — I083 Combined rheumatic disorders of mitral, aortic and tricuspid valves: Secondary | ICD-10-CM | POA: Diagnosis present

## 2021-11-24 DIAGNOSIS — E860 Dehydration: Secondary | ICD-10-CM | POA: Diagnosis not present

## 2021-11-24 DIAGNOSIS — E039 Hypothyroidism, unspecified: Secondary | ICD-10-CM | POA: Diagnosis present

## 2021-11-24 DIAGNOSIS — G9341 Metabolic encephalopathy: Secondary | ICD-10-CM | POA: Diagnosis not present

## 2021-11-24 DIAGNOSIS — Z8261 Family history of arthritis: Secondary | ICD-10-CM

## 2021-11-24 DIAGNOSIS — E876 Hypokalemia: Secondary | ICD-10-CM | POA: Diagnosis not present

## 2021-11-24 DIAGNOSIS — I5032 Chronic diastolic (congestive) heart failure: Secondary | ICD-10-CM | POA: Insufficient documentation

## 2021-11-24 DIAGNOSIS — R636 Underweight: Secondary | ICD-10-CM | POA: Diagnosis present

## 2021-11-24 DIAGNOSIS — Z515 Encounter for palliative care: Secondary | ICD-10-CM

## 2021-11-24 DIAGNOSIS — Z888 Allergy status to other drugs, medicaments and biological substances status: Secondary | ICD-10-CM

## 2021-11-24 DIAGNOSIS — G319 Degenerative disease of nervous system, unspecified: Secondary | ICD-10-CM | POA: Diagnosis not present

## 2021-11-24 DIAGNOSIS — R5381 Other malaise: Secondary | ICD-10-CM | POA: Diagnosis not present

## 2021-11-24 DIAGNOSIS — I952 Hypotension due to drugs: Secondary | ICD-10-CM | POA: Diagnosis not present

## 2021-11-24 DIAGNOSIS — R4182 Altered mental status, unspecified: Secondary | ICD-10-CM | POA: Diagnosis not present

## 2021-11-24 DIAGNOSIS — F0150 Vascular dementia without behavioral disturbance: Secondary | ICD-10-CM | POA: Diagnosis present

## 2021-11-24 DIAGNOSIS — R918 Other nonspecific abnormal finding of lung field: Secondary | ICD-10-CM | POA: Diagnosis not present

## 2021-11-24 DIAGNOSIS — M79604 Pain in right leg: Secondary | ICD-10-CM | POA: Diagnosis not present

## 2021-11-24 DIAGNOSIS — Z96641 Presence of right artificial hip joint: Secondary | ICD-10-CM | POA: Diagnosis present

## 2021-11-24 DIAGNOSIS — Z9049 Acquired absence of other specified parts of digestive tract: Secondary | ICD-10-CM | POA: Diagnosis not present

## 2021-11-24 DIAGNOSIS — N1831 Chronic kidney disease, stage 3a: Secondary | ICD-10-CM | POA: Diagnosis present

## 2021-11-24 DIAGNOSIS — Z87891 Personal history of nicotine dependence: Secondary | ICD-10-CM

## 2021-11-24 DIAGNOSIS — I2510 Atherosclerotic heart disease of native coronary artery without angina pectoris: Secondary | ICD-10-CM | POA: Diagnosis present

## 2021-11-24 DIAGNOSIS — J90 Pleural effusion, not elsewhere classified: Secondary | ICD-10-CM | POA: Diagnosis not present

## 2021-11-24 DIAGNOSIS — Z66 Do not resuscitate: Secondary | ICD-10-CM | POA: Diagnosis present

## 2021-11-24 DIAGNOSIS — G4733 Obstructive sleep apnea (adult) (pediatric): Secondary | ICD-10-CM | POA: Diagnosis present

## 2021-11-24 DIAGNOSIS — Z8673 Personal history of transient ischemic attack (TIA), and cerebral infarction without residual deficits: Secondary | ICD-10-CM

## 2021-11-24 DIAGNOSIS — S72001D Fracture of unspecified part of neck of right femur, subsequent encounter for closed fracture with routine healing: Secondary | ICD-10-CM

## 2021-11-24 DIAGNOSIS — R627 Adult failure to thrive: Secondary | ICD-10-CM | POA: Diagnosis not present

## 2021-11-24 DIAGNOSIS — Z6830 Body mass index (BMI) 30.0-30.9, adult: Secondary | ICD-10-CM

## 2021-11-24 DIAGNOSIS — Z803 Family history of malignant neoplasm of breast: Secondary | ICD-10-CM

## 2021-11-24 DIAGNOSIS — Z743 Need for continuous supervision: Secondary | ICD-10-CM | POA: Diagnosis not present

## 2021-11-24 DIAGNOSIS — T461X5A Adverse effect of calcium-channel blockers, initial encounter: Secondary | ICD-10-CM | POA: Diagnosis not present

## 2021-11-24 DIAGNOSIS — Z7901 Long term (current) use of anticoagulants: Secondary | ICD-10-CM

## 2021-11-24 DIAGNOSIS — R945 Abnormal results of liver function studies: Secondary | ICD-10-CM | POA: Diagnosis not present

## 2021-11-24 DIAGNOSIS — Z79899 Other long term (current) drug therapy: Secondary | ICD-10-CM

## 2021-11-24 DIAGNOSIS — N2889 Other specified disorders of kidney and ureter: Secondary | ICD-10-CM | POA: Diagnosis not present

## 2021-11-24 DIAGNOSIS — I6782 Cerebral ischemia: Secondary | ICD-10-CM | POA: Diagnosis not present

## 2021-11-24 LAB — COMPREHENSIVE METABOLIC PANEL
ALT: 10 U/L (ref 0–44)
AST: 21 U/L (ref 15–41)
Albumin: 3.4 g/dL — ABNORMAL LOW (ref 3.5–5.0)
Alkaline Phosphatase: 87 U/L (ref 38–126)
Anion gap: 8 (ref 5–15)
BUN: 18 mg/dL (ref 8–23)
CO2: 25 mmol/L (ref 22–32)
Calcium: 8.9 mg/dL (ref 8.9–10.3)
Chloride: 109 mmol/L (ref 98–111)
Creatinine, Ser: 0.98 mg/dL (ref 0.44–1.00)
GFR, Estimated: 58 mL/min — ABNORMAL LOW (ref >60)
Glucose, Bld: 93 mg/dL (ref 70–99)
Potassium: 3.1 mmol/L — ABNORMAL LOW (ref 3.5–5.1)
Sodium: 142 mmol/L (ref 135–145)
Total Bilirubin: 1.5 mg/dL — ABNORMAL HIGH (ref 0.3–1.2)
Total Protein: 6.3 g/dL — ABNORMAL LOW (ref 6.5–8.1)

## 2021-11-24 LAB — RESP PANEL BY RT-PCR (FLU A&B, COVID) ARPGX2
Influenza A by PCR: NEGATIVE
Influenza B by PCR: NEGATIVE
SARS Coronavirus 2 by RT PCR: NEGATIVE

## 2021-11-24 LAB — CBC
HCT: 34.7 % — ABNORMAL LOW (ref 36.0–46.0)
Hemoglobin: 10.1 g/dL — ABNORMAL LOW (ref 12.0–15.0)
MCH: 29.3 pg (ref 26.0–34.0)
MCHC: 29.1 g/dL — ABNORMAL LOW (ref 30.0–36.0)
MCV: 100.6 fL — ABNORMAL HIGH (ref 80.0–100.0)
Platelets: 191 10*3/uL (ref 150–400)
RBC: 3.45 MIL/uL — ABNORMAL LOW (ref 3.87–5.11)
RDW: 17 % — ABNORMAL HIGH (ref 11.5–15.5)
WBC: 5.1 10*3/uL (ref 4.0–10.5)
nRBC: 0 % (ref 0.0–0.2)

## 2021-11-24 LAB — D-DIMER, QUANTITATIVE: D-Dimer, Quant: 1.99 ug/mL-FEU — ABNORMAL HIGH (ref 0.00–0.50)

## 2021-11-24 LAB — LACTIC ACID, PLASMA
Lactic Acid, Venous: 1.5 mmol/L (ref 0.5–1.9)
Lactic Acid, Venous: 2 mmol/L (ref 0.5–1.9)

## 2021-11-24 LAB — PROCALCITONIN: Procalcitonin: <0.1 ng/mL

## 2021-11-24 LAB — TROPONIN I (HIGH SENSITIVITY): Troponin I (High Sensitivity): 16 ng/L (ref <18)

## 2021-11-24 LAB — MAGNESIUM: Magnesium: 1.7 mg/dL (ref 1.7–2.4)

## 2021-11-24 LAB — BRAIN NATRIURETIC PEPTIDE: B Natriuretic Peptide: 915 pg/mL — ABNORMAL HIGH (ref 0.0–100.0)

## 2021-11-24 MED ORDER — SODIUM CHLORIDE 0.9 % IV SOLN
1.0000 g | Freq: Once | INTRAVENOUS | Status: AC
  Administered 2021-11-24: 1 g via INTRAVENOUS
  Filled 2021-11-24: qty 10

## 2021-11-24 MED ORDER — IOHEXOL 350 MG/ML SOLN
50.0000 mL | Freq: Once | INTRAVENOUS | Status: AC | PRN
  Administered 2021-11-24: 50 mL via INTRAVENOUS

## 2021-11-24 MED ORDER — METOPROLOL SUCCINATE ER 100 MG PO TB24
100.0000 mg | ORAL_TABLET | Freq: Every day | ORAL | Status: DC
  Administered 2021-11-24 – 2021-11-25 (×2): 100 mg via ORAL
  Filled 2021-11-24 (×2): qty 1

## 2021-11-24 MED ORDER — POTASSIUM CHLORIDE CRYS ER 20 MEQ PO TBCR
40.0000 meq | EXTENDED_RELEASE_TABLET | Freq: Once | ORAL | Status: AC
  Administered 2021-11-24: 40 meq via ORAL
  Filled 2021-11-24: qty 2

## 2021-11-24 MED ORDER — ALBUTEROL SULFATE (2.5 MG/3ML) 0.083% IN NEBU
2.5000 mg | INHALATION_SOLUTION | RESPIRATORY_TRACT | Status: DC | PRN
Start: 2021-11-24 — End: 2021-12-06

## 2021-11-24 MED ORDER — ONDANSETRON HCL 4 MG/2ML IJ SOLN
4.0000 mg | Freq: Three times a day (TID) | INTRAMUSCULAR | Status: DC | PRN
Start: 2021-11-24 — End: 2021-12-06

## 2021-11-24 MED ORDER — RIVAROXABAN 15 MG PO TABS
15.0000 mg | ORAL_TABLET | Freq: Every day | ORAL | Status: DC
  Administered 2021-11-24 – 2021-11-25 (×2): 15 mg via ORAL
  Filled 2021-11-24 (×5): qty 1

## 2021-11-24 MED ORDER — VITAMIN B-12 1000 MCG PO TABS
1000.0000 ug | ORAL_TABLET | Freq: Every day | ORAL | Status: DC
  Administered 2021-11-24 – 2021-12-03 (×3): 1000 ug via ORAL
  Filled 2021-11-24 (×9): qty 1

## 2021-11-24 MED ORDER — DM-GUAIFENESIN ER 30-600 MG PO TB12
1.0000 | ORAL_TABLET | Freq: Two times a day (BID) | ORAL | Status: DC | PRN
Start: 2021-11-24 — End: 2021-11-29

## 2021-11-24 MED ORDER — ACETAMINOPHEN 325 MG PO TABS
650.0000 mg | ORAL_TABLET | Freq: Four times a day (QID) | ORAL | Status: DC | PRN
  Administered 2021-11-24 – 2021-12-02 (×2): 650 mg via ORAL
  Filled 2021-11-24 (×2): qty 2

## 2021-11-24 MED ORDER — LEVOTHYROXINE SODIUM 88 MCG PO TABS
88.0000 ug | ORAL_TABLET | Freq: Every day | ORAL | Status: DC
  Administered 2021-11-25 – 2021-12-03 (×2): 88 ug via ORAL
  Filled 2021-11-24 (×10): qty 1

## 2021-11-24 MED ORDER — HYDRALAZINE HCL 20 MG/ML IJ SOLN
5.0000 mg | INTRAMUSCULAR | Status: DC | PRN
  Administered 2021-11-28: 5 mg via INTRAVENOUS
  Filled 2021-11-24: qty 1

## 2021-11-24 MED ORDER — ERYTHROMYCIN 5 MG/GM OP OINT
1.0000 "application " | TOPICAL_OINTMENT | Freq: Two times a day (BID) | OPHTHALMIC | Status: DC
  Administered 2021-11-24 – 2021-12-06 (×18): 1 via OPHTHALMIC
  Filled 2021-11-24 (×2): qty 1

## 2021-11-24 MED ORDER — METHOCARBAMOL 500 MG PO TABS
500.0000 mg | ORAL_TABLET | Freq: Three times a day (TID) | ORAL | Status: DC | PRN
  Administered 2021-11-25 – 2021-12-02 (×2): 500 mg via ORAL
  Filled 2021-11-24 (×3): qty 1

## 2021-11-24 MED ORDER — APOAEQUORIN 10 MG PO CAPS: 10.0000 mg | ORAL_CAPSULE | Freq: Every day | ORAL | Status: DC

## 2021-11-24 MED ORDER — FUROSEMIDE 10 MG/ML IJ SOLN
20.0000 mg | Freq: Two times a day (BID) | INTRAMUSCULAR | Status: DC
Start: 2021-11-24 — End: 2021-11-26
  Administered 2021-11-24 – 2021-11-26 (×4): 20 mg via INTRAVENOUS
  Filled 2021-11-24 (×4): qty 4

## 2021-11-24 MED ORDER — ATORVASTATIN CALCIUM 20 MG PO TABS
40.0000 mg | ORAL_TABLET | Freq: Every day | ORAL | Status: DC
  Administered 2021-11-24: 40 mg via ORAL
  Filled 2021-11-24 (×2): qty 2

## 2021-11-24 MED ORDER — SODIUM CHLORIDE 0.9 % IV SOLN
500.0000 mg | Freq: Once | INTRAVENOUS | Status: AC
  Administered 2021-11-24: 500 mg via INTRAVENOUS
  Filled 2021-11-24: qty 5

## 2021-11-24 MED ORDER — LATANOPROST 0.005 % OP SOLN
1.0000 [drp] | Freq: Every day | OPHTHALMIC | Status: DC
  Administered 2021-11-24 – 2021-12-05 (×10): 1 [drp] via OPHTHALMIC
  Filled 2021-11-24: qty 2.5

## 2021-11-24 NOTE — ED Triage Notes (Addendum)
Patient arrived by EMS from home. Husband reports increased weakness, sob, and increased AMS. Husband reports possible beg stages of dementia and has been speaking with PCP concerning it. Patient denies pain but reports she is SOB when ambulating. Right hip surgery in March 2023 ?

## 2021-11-24 NOTE — H&P (Signed)
?History and Physical  ? ? ?Brittney Ramirez MWU:[id number] DOB: May 05, 1941 DOA: 11/24/2021 ? ?Referring MD/NP/PA:  ? ?PCP: Adin Hector, MD  ? ?Patient coming from:  The patient is coming from home.         ? ?Chief Complaint: SOB and AMS ? ?HPI: Brittney Ramirez is a 81 y.o. female with medical history significant of hypertension, hyperlipidemia, GERD, hypothyroidism, OSA not on CPAP, CAD, CKD-3, CHF, atrial fibrillation on Xarelto, dementia, iron deficiency anemia, who presents with shortness of breath and altered mental status. ? ?Patient was recently hospitalized from 3/2 - 3/4 due to right hip fracture.  Patient is s/p of right hip surgery. She finished course of rehab.  Per her son and husband at the bedside, in the past 2 days, patient has shortness breath which has been progressively worsening.  No chest pain, fever or chills.  Has mild dry cough.  Patient has history of dementia, but most of the time patient recognizes family members, is oriented to time and place. Patient is more confused today.  When I saw patient in the ED, patient is confused, knows her own name, but not orientated to time and place.  Patient moves all extremities normally.  No facial droop or slurred speech. Per her husband, patient has chronic intermittent loose stool bowel movement, but the patient is Colace and MiraLAX.  No nausea, vomiting or abdominal pain.  No symptoms of UTI.  Her last dose of Xarelto was last night. ? ?Data Reviewed and ED Course: pt was found to have WBC 5.1, procalcitonin <0.10, BNP 915, troponin level 16, negative COVID PCR, positive D-dimer 199, potassium 3.1, renal function stable with creatinine 0.98 and BUN 18, lactic acid 1.5, temperature normal, blood pressure 160/74, heart rate 86, RR 23, oxygen saturation 97% on room air.  Chest x-ray showed left lower lobe patchy infiltration.  CT of head is negative.  CT angiogram is negative for PE, does showed moderate bilateral pleural effusion and pulmonary edema.   Patient is admitted to telemetry bed as inpatient ? ? ?CTA: ?1. Negative examination for pulmonary embolism. ?2. Moderate bilateral pleural effusions and associated atelectasis ?or consolidation. ?3. Diffuse bilateral bronchial wall thickening with mosaic ?attenuation of the airspaces and interlobular septal thickening, ?findings consistent with pulmonary edema. ?4. Cardiomegaly and coronary artery disease. ?  ?Aortic Atherosclerosis (ICD10-I70.0). ? ?EKG: I have personally reviewed.  Atrial fibrillation, QTc 461, low voltage. ? ? ?Review of Systems: Could not be reviewed accurately due to dementia and altered mental status ? ? ?Allergy:  ?Allergies  ?Allergen Reactions  ? Morphine And Related Nausea And Vomiting  ?  Diarrhea  ? Buprenorphine Hcl Nausea And Vomiting  ?  Diarrhea  ? Codeine Nausea Only  ?  GI upset ?  ? Desipramine Nausea Only  ? Fosamax [Alendronate] Rash  ? ? ?Past Medical History:  ?Diagnosis Date  ? Aortic atherosclerosis ([place])   ? Arthritis   ? Atrial fibrillation ([place])   ? a.) CHA2DS2-VASc = 6 (age x 2, sex, CHF, HTN, aortic plaque). b.) rate/rhythm maintained on oral metoprolol succinate; chronically anticoagulated using dose reduced rivaroxaban d/t CKD.  ? Basal cell carcinoma   ? CHF (congestive heart failure) ([place]) 10/26/2014  ? a.)  TTE 10/26/2014: EF 45%; mild AR/PR; moderate MR/TR. b.) TTE 12/03/2016 mild LV dysfunction with LVH; EF 40%; global HK; severe LA and moderate RA enlargement; mild AR/PR, moderate MR/TR. c.)  TTE 01/25/2020: EF 45%; global HK; moderate LAE and  mild RA enlargement; mild RV enlargement; trivial PR, mild AR, moderate to severe MR, moderate TR.  ? CKD (chronic kidney disease), stage III ([place])   ? Coronary artery calcification seen on CT scan   ? Dementia ([place])   ? a.) on donepezil + apoaequorin  ? Diverticulosis   ? DOE (dyspnea on exertion)   ? Fatty infiltration of liver   ? Full dentures   ? GERD (gastroesophageal reflux disease)   ? Hyperlipidemia   ?  Hypertension   ? Hypothyroidism   ? Jaw fracture ([place]) 02/1995  ? a.) BILATERAL subcondylar fractures  ? Long term current use of anticoagulant   ? a.) rivaroxaban; dose reduced d/t CKD  ? Miscarriage   ? OSA (obstructive sleep apnea)   ? a.) does not require nocturnal PAP therapy  ? Osteoporosis   ? Pulmonary nodules   ? Thoracic aortic aneurysm (TAA) ([place]) 12/14/2015  ? a.) CT 12/14/2015: measured 4.2 cm. b.) CT 11/28/2016: not appreciated by radiologist, however CT reviewed by vascular and 4.2-4.3 cm aneurysmal dilitation noted.  ? Thyroid cyst   ? ? ?Past Surgical History:  ?Procedure Laterality Date  ? CATARACT EXTRACTION Right   ? CHOLECYSTECTOMY  2014  ? COLONOSCOPY    ? JOINT REPLACEMENT Bilateral 2003  ? knees  ? TOTAL HIP ARTHROPLASTY Right 10/17/2021  ? Procedure: TOTAL HIP ARTHROPLASTY ANTERIOR APPROACH;  Surgeon: Hessie Knows, MD;  Location: [facility] ORS;  Service: Orthopedics;  Laterality: Right;  ? ? ?Social History:  reports that she quit smoking about 66 years ago. Her smoking use included cigarettes. She started smoking about 66 years ago. She has never used smokeless tobacco. She reports that she does not drink alcohol and does not use drugs. ? ?Family History:  ?Family History  ?Problem Relation Age of Onset  ? Heart disease Mother   ? Hypertension Mother   ? Heart disease Father   ? Hypertension Father   ? Arthritis Sister   ? Cancer Sister   ? Diabetes Sister   ? Heart disease Sister   ? Breast cancer Sister 26  ? Diabetes Brother   ? Heart disease Brother   ? Hypertension Brother   ?  ? ?Prior to Admission medications   ?Medication Sig Start Date End Date Taking? Authorizing Provider  ?acetaminophen (TYLENOL) 500 MG tablet Take 2 tablets (1,000 mg total) by mouth every 6 (six) hours. 10/19/21   Duanne Guess, PA-C  ?Apoaequorin (PREVAGEN) 10 MG CAPS Take 10 mg by mouth daily.    [provider]  ?atorvastatin (LIPITOR) 40 MG tablet Take 1 tablet (40 mg total) by mouth at bedtime. 07/29/19    Volney American, PA-C  ?docusate sodium (COLACE) 100 MG capsule Take 1 capsule (100 mg total) by mouth 2 (two) times daily. 10/18/21   Duanne Guess, PA-C  ?donepezil (ARICEPT) 5 MG tablet Take 5 mg by mouth at bedtime. 08/28/20   [provider]  ?erythromycin ophthalmic ointment Place 1 application into the left eye in the morning and at bedtime.    [provider]  ?ferrous [id number] C-folic acid (TRINSICON / FOLTRIN) capsule Take 1 capsule by mouth 2 (two) times daily after a meal. 10/18/21   Duanne Guess, PA-C  ?ferrous sulfate 325 (65 FE) MG EC tablet Take 325 mg by mouth 2 (two) times a week.    [provider]  ?HYDROcodone-acetaminophen (NORCO) 7.5-325 MG tablet Take 0.5-1 tablets by mouth every 4 (four) hours as needed  for severe pain (pain score 7-10). 10/18/21   Duanne Guess, PA-C  ?latanoprost (XALATAN) 0.005 % ophthalmic solution Place 1 drop into both eyes at bedtime. 08/20/14   [provider]  ?levothyroxine (SYNTHROID) 88 MCG tablet Take 1 tablet (88 mcg total) by mouth daily before breakfast. 07/29/19   Volney American, PA-C  ?methocarbamol (ROBAXIN) 500 MG tablet Take 1 tablet (500 mg total) by mouth every 6 (six) hours as needed for muscle spasms. 10/18/21   Duanne Guess, PA-C  ?metoprolol succinate (TOPROL-XL) 100 MG 24 hr tablet Take 100 mg by mouth daily. 08/27/20   [provider]  ?ondansetron (ZOFRAN) 4 MG tablet Take 1 tablet (4 mg total) by mouth every 6 (six) hours as needed for nausea. 10/18/21   Duanne Guess, PA-C  ?polyethylene glycol (MIRALAX / GLYCOLAX) 17 g packet Take 17 g by mouth daily as needed for mild constipation. 10/18/21   Duanne Guess, PA-C  ?valsartan-hydrochlorothiazide (DIOVAN-HCT) 160-25 MG tablet Take 1 tablet by mouth daily. 07/29/19   Volney American, PA-C  ?vitamin B-12 (CYANOCOBALAMIN) 1000 MCG tablet Take 1,000 mcg by mouth daily.    [provider]  ?XARELTO 15 MG  TABS tablet Take 15 mg by mouth daily. 07/08/20   [provider]  ? ? ?Physical Exam: ?Vitals:  ? 11/24/21 1430 11/24/21 1433 11/24/21 1500 11/24/21 1603  ?BP: 125/66  (!) 143/81 (!) 141/100  ?Pulse

## 2021-11-24 NOTE — ED Notes (Signed)
Patient transported to X-ray 

## 2021-11-24 NOTE — ED Provider Notes (Signed)
? ?[facility] ?Provider Note ? ? ? Event Date/Time  ? First MD Initiated Contact with Patient 11/24/21 [phone]   ?  (approximate) ? ? ?History  ? ?Weakness ? ? ?HPI ? ?Brittney Ramirez is a 81 y.o. female with a history of CHF, atrial fibrillation, CKD, CAD, dementia who presents with shortness of breath.  Husband reports patient had hip surgery last month, has been doing physical therapy at home, had been doing well with her ambulation.  However over the last 2 days has become very short of breath with exertion.  He heard her with a dry cough last night.  No fevers ?  ? ? ?Physical Exam  ? ?Triage Vital Signs: ?ED Triage Vitals  ?Enc Vitals Group  ?   BP 11/24/21 0853 (!) 134/107  ?   Pulse Rate 11/24/21 0853 85  ?   Resp 11/24/21 0853 20  ?   Temp 11/24/21 0853 97.7 ?F (36.5 ?C)  ?   Temp Source 11/24/21 0853 Oral  ?   SpO2 11/24/21 0853 97 %  ?   Weight 11/24/21 0857 86.6 kg (191 lb)  ?   Height 11/24/21 0857 1.676 m ('5\' 6"'$ )  ?   Head Circumference --   ?   Peak Flow --   ?   Pain Score 11/24/21 0857 0  ?   Pain Loc --   ?   Pain Edu? --   ?   Excl. in [place]? --   ? ? ?Most recent vital signs: ?Vitals:  ? 11/24/21 1045 11/24/21 1100  ?BP:  (!) 160/74  ?Pulse: 64 68  ?Resp: 17 18  ?Temp:    ?SpO2: 96% 93%  ? ? ? ?General: Awake, no distress.  ?CV:  Good peripheral perfusion.  ?Resp:  Increased effort with mild tachypnea, no wheezing ?Abd:  No distention.  ?Other:  Mild lower extremity swelling bilaterally ? ? ?ED Results / Procedures / Treatments  ? ?Labs ?(all labs ordered are listed, but only abnormal results are displayed) ?Labs Reviewed  ?CBC - Abnormal; Notable for the following components:  ?    Result Value  ? RBC 3.45 (*)   ? Hemoglobin 10.1 (*)   ? HCT 34.7 (*)   ? MCV 100.6 (*)   ? MCHC 29.1 (*)   ? RDW 17.0 (*)   ? All other components within normal limits  ?COMPREHENSIVE METABOLIC PANEL - Abnormal; Notable for the following components:  ? Potassium 3.1 (*)   ? Total Protein 6.3 (*)   ?  Albumin 3.4 (*)   ? Total Bilirubin 1.5 (*)   ? GFR, Estimated 58 (*)   ? All other components within normal limits  ?CULTURE, BLOOD (ROUTINE X 2)  ?CULTURE, BLOOD (ROUTINE X 2)  ?RESP PANEL BY RT-PCR (FLU A&B, COVID) [id number]  ?LACTIC ACID, PLASMA  ?LACTIC ACID, PLASMA  ?BRAIN NATRIURETIC PEPTIDE  ?TROPONIN I (HIGH SENSITIVITY)  ? ? ? ?EKG ? ?ED ECG REPORT ?I, Lavonia Drafts, the attending physician, personally viewed and interpreted this ECG. ? ?Date: 11/24/2021 ? ?Rhythm: Atrial fibrillation ?QRS Axis: normal ?Intervals: normal ?ST/T Wave abnormalities: normal ?Narrative Interpretation: no evidence of acute ischemia ? ? ? ?RADIOLOGY ?Chest x-ray viewed interpreted by me, left lobe infiltrate consistent with pneumonia ? ? ? ?PROCEDURES: ? ?Critical Care performed:  ? ?Procedures ? ? ?MEDICATIONS ORDERED IN ED: ?Medications  ?azithromycin (ZITHROMAX) 500 mg in sodium chloride 0.9 % 250 mL IVPB (500 mg Intravenous New Bag/Given 11/24/21  1119)  ?cefTRIAXone (ROCEPHIN) 1 g in sodium chloride 0.9 % 100 mL IVPB (0 g Intravenous Stopped 11/24/21 1114)  ? ? ? ?IMPRESSION / MDM / ASSESSMENT AND PLAN / ED COURSE  ?I reviewed the triage vital signs and the nursing notes. ? ?Patient presents with increased shortness of breath, particular with any exertion, husband reports worsening mental status as well, she does have early dementia reportedly.  Afebrile here. ? ?Differential includes pneumonia, CHF, less likely PE as she is on Xarelto although she had recent surgery ? ?Pending labs, chest x-ray ? ?Chest x-ray is consistent with pneumonia, will start the patient on IV Rocephin, IV azithromycin ? ?Patient will require admission for treatment of pneumonia ? ?Lab work is overall reassuring, normal white blood cell count, lactic acid is normal.  Troponin top normal ? ? ?We will discuss with the hospitalist for admission ? ? ?  ? ? ?FINAL CLINICAL IMPRESSION(S) / ED DIAGNOSES  ? ?Final diagnoses:  ?Community acquired pneumonia of left  lower lobe of lung  ? ? ? ?Rx / DC Orders  ? ?ED Discharge Orders   ? ? None  ? ?  ? ? ? ?Note:  This document was prepared using Dragon voice recognition software and may include unintentional dictation errors. ?  ?Lavonia Drafts, MD ?11/24/21 1145 ? ?

## 2021-11-25 DIAGNOSIS — I5033 Acute on chronic diastolic (congestive) heart failure: Secondary | ICD-10-CM | POA: Diagnosis not present

## 2021-11-25 LAB — CBC
HCT: 37.7 % (ref 36.0–46.0)
Hemoglobin: 11.2 g/dL — ABNORMAL LOW (ref 12.0–15.0)
MCH: 29.9 pg (ref 26.0–34.0)
MCHC: 29.7 g/dL — ABNORMAL LOW (ref 30.0–36.0)
MCV: 100.5 fL — ABNORMAL HIGH (ref 80.0–100.0)
Platelets: 207 10*3/uL (ref 150–400)
RBC: 3.75 MIL/uL — ABNORMAL LOW (ref 3.87–5.11)
RDW: 17.2 % — ABNORMAL HIGH (ref 11.5–15.5)
WBC: 7.3 10*3/uL (ref 4.0–10.5)
nRBC: 0.3 % — ABNORMAL HIGH (ref 0.0–0.2)

## 2021-11-25 LAB — URINALYSIS, COMPLETE (UACMP) WITH MICROSCOPIC
Bilirubin Urine: NEGATIVE
Glucose, UA: NEGATIVE mg/dL
Ketones, ur: NEGATIVE mg/dL
Nitrite: NEGATIVE
Protein, ur: NEGATIVE mg/dL
Specific Gravity, Urine: 1.011 (ref 1.005–1.030)
Squamous Epithelial / HPF: NONE SEEN (ref 0–5)
pH: 7 (ref 5.0–8.0)

## 2021-11-25 LAB — BASIC METABOLIC PANEL
Anion gap: 14 (ref 5–15)
BUN: 19 mg/dL (ref 8–23)
CO2: 20 mmol/L — ABNORMAL LOW (ref 22–32)
Calcium: 9.2 mg/dL (ref 8.9–10.3)
Chloride: 110 mmol/L (ref 98–111)
Creatinine, Ser: 1.25 mg/dL — ABNORMAL HIGH (ref 0.44–1.00)
GFR, Estimated: 43 mL/min — ABNORMAL LOW (ref >60)
Glucose, Bld: 126 mg/dL — ABNORMAL HIGH (ref 70–99)
Potassium: 4 mmol/L (ref 3.5–5.1)
Sodium: 144 mmol/L (ref 135–145)

## 2021-11-25 LAB — STREP PNEUMONIAE URINARY ANTIGEN: Strep Pneumo Urinary Antigen: POSITIVE — AB

## 2021-11-25 LAB — MAGNESIUM: Magnesium: 1.8 mg/dL (ref 1.7–2.4)

## 2021-11-25 LAB — GLUCOSE, CAPILLARY: Glucose-Capillary: 116 mg/dL — ABNORMAL HIGH (ref 70–99)

## 2021-11-25 MED ORDER — DILTIAZEM HCL 25 MG/5ML IV SOLN
10.0000 mg | Freq: Four times a day (QID) | INTRAVENOUS | Status: DC | PRN
  Administered 2021-11-26 – 2021-11-27 (×2): 10 mg via INTRAVENOUS
  Filled 2021-11-25 (×6): qty 5

## 2021-11-25 MED ORDER — SODIUM CHLORIDE 0.9 % IV SOLN
INTRAVENOUS | Status: DC | PRN
Start: 2021-11-25 — End: 2021-12-05

## 2021-11-25 MED ORDER — HALOPERIDOL 2 MG PO TABS
2.0000 mg | ORAL_TABLET | Freq: Four times a day (QID) | ORAL | Status: DC | PRN
  Filled 2021-11-25: qty 1

## 2021-11-25 MED ORDER — SODIUM CHLORIDE 0.9 % IV SOLN
1.0000 g | INTRAVENOUS | Status: DC
  Administered 2021-11-25 – 2021-11-29 (×5): 1 g via INTRAVENOUS
  Filled 2021-11-25: qty 10
  Filled 2021-11-25 (×4): qty 1

## 2021-11-25 MED ORDER — METOPROLOL TARTRATE 50 MG PO TABS
100.0000 mg | ORAL_TABLET | Freq: Two times a day (BID) | ORAL | Status: DC
  Filled 2021-11-25 (×2): qty 2

## 2021-11-25 MED ORDER — DILTIAZEM HCL 25 MG/5ML IV SOLN
10.0000 mg | Freq: Once | INTRAVENOUS | Status: AC
  Administered 2021-11-25: 10 mg via INTRAVENOUS
  Filled 2021-11-25: qty 5

## 2021-11-25 MED ORDER — HALOPERIDOL LACTATE 5 MG/ML IJ SOLN
2.0000 mg | Freq: Four times a day (QID) | INTRAMUSCULAR | Status: DC | PRN
  Administered 2021-11-27: 2 mg via INTRAMUSCULAR
  Filled 2021-11-25: qty 1

## 2021-11-25 MED ORDER — METOPROLOL TARTRATE 5 MG/5ML IV SOLN
5.0000 mg | Freq: Once | INTRAVENOUS | Status: AC
  Administered 2021-11-25: 5 mg via INTRAVENOUS
  Filled 2021-11-25: qty 5

## 2021-11-25 MED ORDER — SODIUM CHLORIDE 0.9 % IV SOLN
500.0000 mg | INTRAVENOUS | Status: DC
  Administered 2021-11-25 – 2021-11-27 (×3): 500 mg via INTRAVENOUS
  Filled 2021-11-25: qty 500
  Filled 2021-11-25 (×2): qty 5
  Filled 2021-11-25: qty 500

## 2021-11-25 NOTE — Progress Notes (Signed)
? ?      CROSS COVER NOTE ? ?NAME: Brittney Ramirez ?MRN: [id number] ?DOB : [birth year] ? ? ? ?Date of Service ?  11/25/2021  ?HPI/Events of Note ?  AFIB with RVR HR 148 BP 133/106. Patient has history of AFIB and is on Xarelto and PO Metoprolol at home.  ?Interventions ?  Plan: ?Metoprolol 5 mg IV ?Continue home PO metoprolol, consider dose increase ?Continue home Xarelto ?   ?  ? ?Neomia Glass MHA, MSN, FNP-BC ?Nurse Practitioner ?[facility] ?[place] ?Pager [phone] ? ?

## 2021-11-25 NOTE — Progress Notes (Signed)
Patient Afib heart rate 130's/140/s, Neomia Glass NP notified in person. New order for Metoprolol '5mg'$  IV push once. Mammie Russian M ? ?

## 2021-11-25 NOTE — TOC CM/SW Note (Addendum)
CSW acknowledges heart failure consult. Sent secure chat to Heart Failure Nurse Navigator to notify. ? ?Dayton Scrape, [place] ?[phone] ? ?9:55 am: Patient is active with [facility] for home health PT and aide but discharge was planned for 4/14. ? ?Dayton Scrape, [place] ?[phone] ? ?

## 2021-11-25 NOTE — Progress Notes (Signed)
?PROGRESS NOTE ? ? ? ?ERCILIA BETTINGER  HYW:[id number] DOB: [birth year] DOA: 11/24/2021 ?PCP: Adin Hector, MD  ? ? ?Brief Narrative:  ? 81 y.o. female with medical history significant of hypertension, hyperlipidemia, GERD, hypothyroidism, OSA not on CPAP, CAD, CKD-3, CHF, atrial fibrillation on Xarelto, dementia, iron deficiency anemia, who presents with shortness of breath and altered mental status. ?  ?Patient was recently hospitalized from 3/2 - 3/4 due to right hip fracture.  Patient is s/p of right hip surgery. She finished course of rehab.  Per her son and husband at the bedside, in the past 2 days, patient has shortness breath which has been progressively worsening.  No chest pain, fever or chills.  Has mild dry cough.  Patient has history of dementia, but most of the time patient recognizes family members, is oriented to time and place. Patient is more confused today. ? ?Pulmonary embolism ruled out.  Presentation consistent with acute decompensated heart failure.  Started on IV diuretics.  Atrial fibrillation with uncontrolled rate ? ? ?Assessment & Plan: ?  ?Principal Problem: ?  Acute on chronic diastolic CHF (congestive heart failure) ([place]) ?Active Problems: ?  Hypothyroidism ?  Hyperlipidemia ?  Benign essential HTN ?  Chronic a-fib ([place]) ?  CAD (coronary artery disease) ?  Iron deficiency anemia ?  Dementia ([place]) ?  Hypokalemia ?  Acute metabolic encephalopathy ? ?Acute on chronic diastolic CHF (congestive heart failure) ([place]) ? 2D echo on 10/10/2021 showed EF of 50%.   ?Patient has 1+ leg edema, elevated BNP 915, pulmonary edema by CTA, clinically consistent with CHF exacerbation.   ?CT angiogram is negative for PE.   ?Chest x-ray showed patchy left lower lobe infiltration, but patient does not have fever and leukocytosis.  Procalcitonin <0.10.  Clinically does not seem to have pneumonia.   ?Plan: ?Continue IV Lasix 40 mg twice daily ?Target net -1-1.5 L daily ?Daily weights ?Strict I's and O's ?Fluid  restrict, low-sodium diet ?  ?Atrial fibrillation with rapid ventricular response ?Rate control is been challenging ?Plan: ?Increase dose of metoprolol ?DC Toprol-XL, start metoprolol tartrate 100 twice daily ?As needed IV diltiazem ?Continue DOAC ? ?Dementia and acute metabolic encephalopathy ? CT of head negative.   ?May be due to delirium ?Plan: ?As needed Haldol ?Frequent reorienting measures ?  ?  ?Hypothyroidism ?-Synthroid ?  ?Hyperlipidemia ?-Lipitor ?  ?Benign essential HTN ?-IV hydralazine as needed ?-Patient is on IV Lasix ?-Metoprolol as above ?  ?CAD (coronary artery disease): No chest pain ?-Continue Lipitor ?  ?Iron deficiency anemia ?Hemoglobin stable, 10.1 today (8.9 10/19/2021).   ?Patient is not taking iron supplement currently ?  ? ?Hypokalemia ?Monitor and replace as necessary ?Keep K>4; Mg>2 ? ? ?DVT prophylaxis: DOAC ?Code Status: DNR ?Family Communication: Left VM for spouse Charlotte Crumb [phone] on 4/10 ?Disposition Plan: Status is: Inpatient ?Remains inpatient appropriate because: Acute decompensated diastolic congestive heart failure, atrial fibrillation rapid reticular response ? ? ?Level of care: Telemetry Medical ? ?Consultants:  ?None ? ?Procedures:  ?None ? ?Antimicrobials: ?None ? ? ?Subjective: ?Seen and examined.  Frail-appearing.  Somewhat confused ? ?Objective: ?Vitals:  ? 11/24/21 2029 11/25/21 0336 11/25/21 0500 11/25/21 0859  ?BP: (!) 152/91 (!) 133/106  (!) 147/100  ?Pulse: 91 94  (!) 51  ?Resp: (!) 24 (!) 23    ?Temp: 97.9 ?F (36.6 ?C) 97.7 ?F (36.5 ?C)  97.9 ?F (36.6 ?C)  ?TempSrc:  Oral    ?SpO2: 98% 93%  95%  ?Weight:  86.1 kg   ?Height:      ? ? ?Intake/Output Summary (Last 24 hours) at 11/25/2021 1250 ?Last data filed at 11/25/2021 0345 ?Gross per 24 hour  ?Intake --  ?Output 400 ml  ?Net -400 ml  ? ?Filed Weights  ? 11/24/21 0857 11/25/21 0500  ?Weight: 86.6 kg 86.1 kg  ? ? ?Examination: ? ?General exam: No acute distress.  Frail-appearing.  Confused ?Respiratory system:  Bibasilar crackles.  Normal work of breathing.  Room air ?Cardiovascular system: Tachycardic, irregular rhythm, no murmurs ?Gastrointestinal system: Thin, soft, NT/ND, normal bowel sounds ?Central nervous system: Alert, oriented x2, no focal deficits ?Extremities: Symmetric 5 x 5 power. ?Skin: No rashes, lesions or ulcers ?Psychiatry: Judgement and insight appear impaired. Mood & affect confused.  ? ? ? ?Data Reviewed: I have personally reviewed following labs and imaging studies ? ?CBC: ?Recent Labs  ?Lab 11/24/21 ?0900 11/25/21 ?0530  ?WBC 5.1 7.3  ?HGB 10.1* 11.2*  ?HCT 34.7* 37.7  ?MCV 100.6* 100.5*  ?PLT 191 207  ? ?Basic Metabolic Panel: ?Recent Labs  ?Lab 11/24/21 ?0900 11/24/21 ?1158 11/25/21 ?0530  ?NA 142  --  144  ?K 3.1*  --  4.0  ?CL 109  --  110  ?CO2 25  --  20*  ?GLUCOSE 93  --  126*  ?BUN 18  --  19  ?CREATININE 0.98  --  1.25*  ?CALCIUM 8.9  --  9.2  ?MG  --  1.7 1.8  ? ?GFR: ?Estimated Creatinine Clearance: 38.3 mL/min (A) (by C-G formula based on SCr of 1.25 mg/dL (H)). ?Liver Function Tests: ?Recent Labs  ?Lab 11/24/21 ?0900  ?AST 21  ?ALT 10  ?ALKPHOS 87  ?BILITOT 1.5*  ?PROT 6.3*  ?ALBUMIN 3.4*  ? ?No results for input(s): LIPASE, AMYLASE in the last 168 hours. ?No results for input(s): AMMONIA in the last 168 hours. ?Coagulation Profile: ?No results for input(s): INR, PROTIME in the last 168 hours. ?Cardiac Enzymes: ?No results for input(s): CKTOTAL, CKMB, CKMBINDEX, TROPONINI in the last 168 hours. ?BNP (last 3 results) ?No results for input(s): PROBNP in the last 8760 hours. ?HbA1C: ?No results for input(s): HGBA1C in the last 72 hours. ?CBG: ?Recent Labs  ?Lab 11/25/21 ?0902  ?GLUCAP 116*  ? ?Lipid Profile: ?No results for input(s): CHOL, HDL, LDLCALC, TRIG, CHOLHDL, LDLDIRECT in the last 72 hours. ?Thyroid Function Tests: ?No results for input(s): TSH, T4TOTAL, FREET4, T3FREE, THYROIDAB in the last 72 hours. ?Anemia Panel: ?No results for input(s): VITAMINB12, FOLATE, FERRITIN, TIBC,  IRON, RETICCTPCT in the last 72 hours. ?Sepsis Labs: ?Recent Labs  ?Lab 11/24/21 ?1029 11/24/21 ?1610  ?PROCALCITON  --  <0.10  ?LATICACIDVEN 1.5 2.0*  ? ? ?Recent Results (from the past 240 hour(s))  ?Blood Culture (routine x 2)     Status: None (Preliminary result)  ? Collection Time: 11/24/21 10:29 AM  ? Specimen: BLOOD  ?Result Value Ref Range Status  ? Specimen Description BLOOD LEFT ANTECUBITAL  Final  ? Special Requests   Final  ?  BOTTLES DRAWN AEROBIC AND ANAEROBIC Blood Culture adequate volume  ? Culture   Final  ?  NO GROWTH < 24 HOURS ?Performed at [facility], [address] [place], [place] [address] ?  ? Report Status PENDING  Incomplete  ?Blood Culture (routine x 2)     Status: None (Preliminary result)  ? Collection Time: 11/24/21 10:29 AM  ? Specimen: BLOOD  ?Result Value Ref Range Status  ? Specimen Description BLOOD RIGHT ANTECUBITAL  Final  ? Special Requests   Final  ?  BOTTLES DRAWN AEROBIC AND ANAEROBIC Blood Culture adequate volume  ? Culture   Final  ?  NO GROWTH < 24 HOURS ?Performed at [facility], [address], [place] [address] ?  ? Report Status PENDING  Incomplete  ?Resp Panel by RT-PCR (Flu A&B, Covid) Nasopharyngeal Swab     Status: None  ? Collection Time: 11/24/21 11:20 AM  ? Specimen: Nasopharyngeal Swab; Nasopharyngeal(NP) swabs in vial transport medium  ?Result Value Ref Range Status  ? SARS Coronavirus 2 by RT PCR NEGATIVE NEGATIVE Final  ?  Comment: (NOTE) ?SARS-CoV-2 target nucleic acids are NOT DETECTED. ? ?The SARS-CoV-2 RNA is generally detectable in upper respiratory ?specimens during the acute phase of infection. The lowest ?concentration of SARS-CoV-2 viral copies this assay can detect is ?138 copies/mL. A negative result does not preclude SARS-Cov-2 ?infection and should not be used as the sole basis for treatment or ?other patient management decisions. A negative result may occur with  ?improper specimen collection/handling,  submission of specimen other ?than nasopharyngeal swab, presence of viral mutation(s) within the ?areas targeted by this assay, and inadequate number of viral ?copies(<138 copies/mL). A negative result must be comb

## 2021-11-25 NOTE — Consult Note (Signed)
? ?  Heart Failure Nurse Navigator Note ? ?HFpEF 50%.  Mild LVH.  Moderate aortic insufficiency.  Moderate mitral regurgitation.  Moderate tricuspid regurgitation.  Mild pulmonary insufficiency. ? ?She presented to the emergency room with complaints of worsening shortness of breath, dry cough.  CT of the chest revealed bilateral pleural effusions and pulmonary edema.  BNP was elevated at 915. ? ?Comorbidities: ? ?Hypertension ?Hyperlipidemia ?GERD ?Obstructive sleep apnea not on CPAP ?Coronary artery disease ?Chronic kidney disease stage III ?Atrial fibrillation on NOAC ?Dementia ?Iron deficiency anemia ? ?Medications: ? ?Atorvastatin 40 mg at bedtime ?Furosemide 20 mg IV every 12 hours ?Xarelto 15 mg daily ? ?Scheduled to start metoprolol tartrate 100 mg 2 times a day. ? ?Labs: ? ?Sodium 144, potassium 4, chloride 110, CO2 20, BUN 19, creatinine 1.25 up from 0.98 of yesterday, estimated GFR 43 ?Weight is 86.1 kg ?Blood pressure 147/100 ?Intake not documented ?Output 400 mL ? ?Initial meeting my husband states that the wife is sleeping and that she did not recognize him when she he walked into the room this morning. ? ?Discussed what heart failure is. ? ?Talked about the importance of low-sodium diet, 2000 mg.  He states that once in a while they like to get a pizza from the local restaurant up which he states that she would eat 3 pieces.  Looked it up on the Internet that 1 slice of pizza made with sausage and pepperoni contains 836 mg of sodium, explained to him if she was eating 3 pieces that would be over her daily allotment.  They also use vegetables that are in the canned, discussed process of draining in a strainer and rinsing with water.  He voices understanding. ? ?Also stressed the importance of daily weight and what to report.  Along with changes in symptoms. ? ?He was given the living with heart failure teaching booklet, information on low-sodium and heart failure.  Along with a weight chart. ? ?She has  follow-up in the outpatient heart failure clinic on April 20 at 3:30 in the afternoon.  She has a 2% ratio of no-show which is 1 out of 46 appointments. ? ?Will continue to follow along. ? ?Pricilla Riffle RN [facility] ? ?

## 2021-11-26 ENCOUNTER — Inpatient Hospital Stay: Payer: Medicare HMO

## 2021-11-26 DIAGNOSIS — I5033 Acute on chronic diastolic (congestive) heart failure: Secondary | ICD-10-CM | POA: Diagnosis not present

## 2021-11-26 LAB — LEGIONELLA PNEUMOPHILA SEROGP 1 UR AG: L. pneumophila Serogp 1 Ur Ag: NEGATIVE

## 2021-11-26 LAB — CBC WITH DIFFERENTIAL/PLATELET
Abs Immature Granulocytes: 0.05 10*3/uL (ref 0.00–0.07)
Basophils Absolute: 0 10*3/uL (ref 0.0–0.1)
Basophils Relative: 0 %
Eosinophils Absolute: 0 10*3/uL (ref 0.0–0.5)
Eosinophils Relative: 0 %
HCT: 37.9 % (ref 36.0–46.0)
Hemoglobin: 11.2 g/dL — ABNORMAL LOW (ref 12.0–15.0)
Immature Granulocytes: 0 %
Lymphocytes Relative: 13 %
Lymphs Abs: 1.4 10*3/uL (ref 0.7–4.0)
MCH: 29.6 pg (ref 26.0–34.0)
MCHC: 29.6 g/dL — ABNORMAL LOW (ref 30.0–36.0)
MCV: 100.3 fL — ABNORMAL HIGH (ref 80.0–100.0)
Monocytes Absolute: 1.1 10*3/uL — ABNORMAL HIGH (ref 0.1–1.0)
Monocytes Relative: 10 %
Neutro Abs: 8.6 10*3/uL — ABNORMAL HIGH (ref 1.7–7.7)
Neutrophils Relative %: 77 %
Platelets: 195 10*3/uL (ref 150–400)
RBC: 3.78 MIL/uL — ABNORMAL LOW (ref 3.87–5.11)
RDW: 17.7 % — ABNORMAL HIGH (ref 11.5–15.5)
WBC: 11.2 10*3/uL — ABNORMAL HIGH (ref 4.0–10.5)
nRBC: 0.2 % (ref 0.0–0.2)

## 2021-11-26 LAB — BASIC METABOLIC PANEL
Anion gap: 18 — ABNORMAL HIGH (ref 5–15)
BUN: 30 mg/dL — ABNORMAL HIGH (ref 8–23)
CO2: 20 mmol/L — ABNORMAL LOW (ref 22–32)
Calcium: 9.3 mg/dL (ref 8.9–10.3)
Chloride: 109 mmol/L (ref 98–111)
Creatinine, Ser: 1.63 mg/dL — ABNORMAL HIGH (ref 0.44–1.00)
GFR, Estimated: 31 mL/min — ABNORMAL LOW (ref >60)
Glucose, Bld: 101 mg/dL — ABNORMAL HIGH (ref 70–99)
Potassium: 4.2 mmol/L (ref 3.5–5.1)
Sodium: 147 mmol/L — ABNORMAL HIGH (ref 135–145)

## 2021-11-26 LAB — MAGNESIUM: Magnesium: 1.9 mg/dL (ref 1.7–2.4)

## 2021-11-26 LAB — GLUCOSE, CAPILLARY: Glucose-Capillary: 99 mg/dL (ref 70–99)

## 2021-11-26 MED ORDER — LORAZEPAM 2 MG/ML IJ SOLN
0.5000 mg | Freq: Once | INTRAMUSCULAR | Status: DC | PRN
Start: 2021-11-26 — End: 2021-12-05

## 2021-11-26 NOTE — Progress Notes (Signed)
?PROGRESS NOTE ? ? ? ?Brittney Ramirez  DVV:[id number] DOB: 1940/08/25 DOA: 11/24/2021 ?PCP: Adin Hector, MD  ? ? ?Brief Narrative:  ? 81 y.o. female with medical history significant of hypertension, hyperlipidemia, GERD, hypothyroidism, OSA not on CPAP, CAD, CKD-3, CHF, atrial fibrillation on Xarelto, dementia, iron deficiency anemia, who presents with shortness of breath and altered mental status. ?  ?Patient was recently hospitalized from 3/2 - 3/4 due to right hip fracture.  Patient is s/p of right hip surgery. She finished course of rehab.  Per her son and husband at the bedside, in the past 2 days, patient has shortness breath which has been progressively worsening.  No chest pain, fever or chills.  Has mild dry cough.  Patient has history of dementia, but most of the time patient recognizes family members, is oriented to time and place. Patient is more confused today. ? ?Pulmonary embolism ruled out.  Presentation consistent with acute decompensated heart failure.  Started on IV diuretics.  Atrial fibrillation with uncontrolled rate ? ?Volume status improved however kidney function worsen and dehydration noted on 4/11.  Patient also remains markedly encephalopathic.  Not eating or drinking, refusing to take p.o. medications.  Has been concerned at bedside.  Infectious delirium remains high on differential.  On IV antibiotics ? ? ?Assessment & Plan: ?  ?Principal Problem: ?  Acute on chronic diastolic CHF (congestive heart failure) ([place]) ?Active Problems: ?  Hypothyroidism ?  Hyperlipidemia ?  Benign essential HTN ?  Chronic a-fib ([place]) ?  CAD (coronary artery disease) ?  Iron deficiency anemia ?  Dementia ([place]) ?  Hypokalemia ?  Acute metabolic encephalopathy ? ?Acute on chronic diastolic CHF (congestive heart failure) ([place]) ? 2D echo on 10/10/2021 showed EF of 50%.   ?Patient has 1+ leg edema, elevated BNP 915, pulmonary edema by CTA, clinically consistent with CHF exacerbation.   ?CT angiogram is negative  for PE.   ?Chest x-ray showed patchy left lower lobe infiltration, but patient does not have fever and leukocytosis.  Procalcitonin <0.10.  Clinically does not seem to have pneumonia.   ?-Sodium increasing, kidney function deteriorating ?Plan: ?Hold IV Lasix for now ?Monitor ins and outs ?Fluid restrict and daily weights ?Low-sodium diet ?Cardiology consulted ?  ?Atrial fibrillation with rapid ventricular response ?Rate control is been challenging ?Plan: ?Continue metoprolol 100 mg p.o. twice daily ?As needed IV diltiazem ?Continue DOAC ? ?Urinary tract infection ?Suspect this may be underlying some of her encephalopathy ?Started IV Rocephin 4/10 ?Plan: ?Continue IV Rocephin ?Follow urine culture ?Monitor vitals and fever curve ? ?Dementia and acute metabolic encephalopathy ? CT of head negative.   ?May be due to delirium ?Infectious encephalopathy remains on differential ?Plan: ?As needed Haldol ?Frequent reorienting measures ?Treatment for UTI as above ?  ?  ?Hypothyroidism ?-Synthroid ?  ?Hyperlipidemia ?-Lipitor ?  ?Benign essential HTN ?-IV hydralazine as needed ?-Metoprolol as above ?  ?CAD (coronary artery disease): No chest pain ?-Continue Lipitor ?  ?Iron deficiency anemia ?Hemoglobin stable  ?Patient is not taking iron supplement currently ?  ? ?Hypokalemia ?Monitor and replace as necessary ?Keep K>4; Mg>2 ? ? ?DVT prophylaxis: DOAC ?Code Status: DNR ?Family Communication: Left VM for spouse Charlotte Crumb [phone] on 4/10, spouse at bedside 4/11 ?Disposition Plan: Status is: Inpatient ?Remains inpatient appropriate because: Acute decompensated diastolic congestive heart failure, atrial fibrillation rapid reticular response, acute metabolic encephalopathy ? ? ?Level of care: Med-Surg ? ?Consultants:  ?None ? ?Procedures:  ?None ? ?Antimicrobials: ?None ? ? ?  Subjective: ?Seen and examined.  Frail-appearing.  Confused, decreased LOC ? ?Objective: ?Vitals:  ? 11/26/21 0338 11/26/21 0453 11/26/21 0754 11/26/21  1039  ?BP: 122/84  132/78 135/74  ?Pulse: (!) 108  83   ?Resp: 18     ?Temp: 97.6 ?F (36.4 ?C)  97.9 ?F (36.6 ?C)   ?TempSrc:      ?SpO2: 100%  97%   ?Weight:  84.6 kg    ?Height:      ? ? ?Intake/Output Summary (Last 24 hours) at 11/26/2021 1233 ?Last data filed at 11/26/2021 [phone] ?Gross per 24 hour  ?Intake 383.12 ml  ?Output 300 ml  ?Net 83.12 ml  ? ?Filed Weights  ? 11/24/21 0857 11/25/21 0500 11/26/21 0453  ?Weight: 86.6 kg 86.1 kg 84.6 kg  ? ? ?Examination: ? ?General exam: No acute distress.  Frail-appearing.  Confused, decreased LOC ?Respiratory system: Bibasilar crackles.  Normal work of breathing.  Room air ?Cardiovascular system: Regular rate, irregular rhythm, no murmurs, no pedal edema ?Gastrointestinal system: Thin, soft, NT/ND, normal bowel sounds ?Central nervous system: Alert, oriented x2, no focal deficits ?Extremities: Symmetric 5 x 5 power. ?Skin: No rashes, lesions or ulcers ?Psychiatry: Judgement and insight appear impaired. Mood & affect confused.  ? ? ? ?Data Reviewed: I have personally reviewed following labs and imaging studies ? ?CBC: ?Recent Labs  ?Lab 11/24/21 ?0900 11/25/21 ?0530 11/26/21 ?0907  ?WBC 5.1 7.3 11.2*  ?NEUTROABS  --   --  8.6*  ?HGB 10.1* 11.2* 11.2*  ?HCT 34.7* 37.7 37.9  ?MCV 100.6* 100.5* 100.3*  ?PLT 191 207 195  ? ?Basic Metabolic Panel: ?Recent Labs  ?Lab 11/24/21 ?0900 11/24/21 ?1158 11/25/21 ?0530 11/26/21 ?0907  ?NA 142  --  144 147*  ?K 3.1*  --  4.0 4.2  ?CL 109  --  110 109  ?CO2 25  --  20* 20*  ?GLUCOSE 93  --  126* 101*  ?BUN 18  --  19 30*  ?CREATININE 0.98  --  1.25* 1.63*  ?CALCIUM 8.9  --  9.2 9.3  ?MG  --  1.7 1.8 1.9  ? ?GFR: ?Estimated Creatinine Clearance: 29.2 mL/min (A) (by C-G formula based on SCr of 1.63 mg/dL (H)). ?Liver Function Tests: ?Recent Labs  ?Lab 11/24/21 ?0900  ?AST 21  ?ALT 10  ?ALKPHOS 87  ?BILITOT 1.5*  ?PROT 6.3*  ?ALBUMIN 3.4*  ? ?No results for input(s): LIPASE, AMYLASE in the last 168 hours. ?No results for input(s): AMMONIA in  the last 168 hours. ?Coagulation Profile: ?No results for input(s): INR, PROTIME in the last 168 hours. ?Cardiac Enzymes: ?No results for input(s): CKTOTAL, CKMB, CKMBINDEX, TROPONINI in the last 168 hours. ?BNP (last 3 results) ?No results for input(s): PROBNP in the last 8760 hours. ?HbA1C: ?No results for input(s): HGBA1C in the last 72 hours. ?CBG: ?Recent Labs  ?Lab 11/25/21 ?0902 11/26/21 ?6789  ?GLUCAP 116* 99  ? ?Lipid Profile: ?No results for input(s): CHOL, HDL, LDLCALC, TRIG, CHOLHDL, LDLDIRECT in the last 72 hours. ?Thyroid Function Tests: ?No results for input(s): TSH, T4TOTAL, FREET4, T3FREE, THYROIDAB in the last 72 hours. ?Anemia Panel: ?No results for input(s): VITAMINB12, FOLATE, FERRITIN, TIBC, IRON, RETICCTPCT in the last 72 hours. ?Sepsis Labs: ?Recent Labs  ?Lab 11/24/21 ?1029 11/24/21 ?1610  ?PROCALCITON  --  <0.10  ?LATICACIDVEN 1.5 2.0*  ? ? ?Recent Results (from the past 240 hour(s))  ?Blood Culture (routine x 2)     Status: None (Preliminary result)  ? Collection Time: 11/24/21 10:29  AM  ? Specimen: BLOOD  ?Result Value Ref Range Status  ? Specimen Description BLOOD LEFT ANTECUBITAL  Final  ? Special Requests   Final  ?  BOTTLES DRAWN AEROBIC AND ANAEROBIC Blood Culture adequate volume  ? Culture   Final  ?  NO GROWTH 2 DAYS ?Performed at [facility], [address], [place], [place] [address] ?  ? Report Status PENDING  Incomplete  ?Blood Culture (routine x 2)     Status: None (Preliminary result)  ? Collection Time: 11/24/21 10:29 AM  ? Specimen: BLOOD  ?Result Value Ref Range Status  ? Specimen Description BLOOD RIGHT ANTECUBITAL  Final  ? Special Requests   Final  ?  BOTTLES DRAWN AEROBIC AND ANAEROBIC Blood Culture adequate volume  ? Culture   Final  ?  NO GROWTH 2 DAYS ?Performed at [facility], [address] [place], [place] [address] ?  ? Report Status PENDING  Incomplete  ?Resp Panel by RT-PCR (Flu A&B, Covid) Nasopharyngeal Swab     Status: None  ?  Collection Time: 11/24/21 11:20 AM  ? Specimen: Nasopharyngeal Swab; Nasopharyngeal(NP) swabs in vial transport medium  ?Result Value Ref Range Status  ? SARS Coronavirus 2 by RT PCR NEGATIVE NEGATIVE Final  ?  C

## 2021-11-26 NOTE — Plan of Care (Signed)
?  Problem: Respiratory: ?Goal: Ability to maintain adequate ventilation will improve ?Outcome: Not Progressing ?  ?Problem: Respiratory: ?Goal: Ability to maintain a clear airway will improve ?Outcome: Not Progressing ?  ?Problem: Clinical Measurements: ?Goal: Diagnostic test results will improve ?Outcome: Not Progressing ?  ?

## 2021-11-26 NOTE — Progress Notes (Signed)
Mobility Specialist - Progress Note ? ? 11/26/21 1000  ?Mobility  ?Activity Contraindicated/medical hold  ? ? ? ?Mobility contraindicated d/t elevated HR ranging 120s-150s. Will hold at this time and attempt when medically appropriate.  ? ? ?Kathee Delton ?Mobility Specialist ?11/26/21, 10:25 AM ? ? ? ? ?  ? ?

## 2021-11-26 NOTE — Progress Notes (Signed)
?   11/25/21 1545  ?Assess: if the MEWS score is Yellow or Red  ?Were vital signs taken at a resting state? Yes  ?Focused Assessment No change from prior assessment  ?Does the patient meet 2 or more of the SIRS criteria? No  ?MEWS guidelines implemented *See Row Information* Yes  ?Treat  ?MEWS Interventions Other (Comment) ?(notified Dr. Priscella Mann)  ?Take Vital Signs  ?Increase Vital Sign Frequency  Yellow: Q 2hr X 2 then Q 4hr X 2, if remains yellow, continue Q 4hrs  ?Escalate  ?MEWS: Escalate Yellow: discuss with charge nurse/RN and consider discussing with provider and RRT  ?Notify: Charge Nurse/RN  ?Name of Charge Nurse/RN Notified [place], RN  ?Date Charge Nurse/RN Notified 11/25/21  ?Time Charge Nurse/RN Notified 1545  ?Notify: Provider  ?Provider Name/Title Dr. Priscella Mann  ?Date Provider Notified 11/25/21  ?Time Provider Notified 1600  ?Notification Type Page  ?Notification Reason Other (Comment) ?(increased HR)  ?Provider response See new orders ?(prn with parameter ordered)  ?Date of Provider Response 11/25/21  ?Time of Provider Response 1605  ?Document  ?Patient Outcome Stabilized after interventions  ? ? ?

## 2021-11-26 NOTE — Consult Note (Signed)
[facility] ? ?CARDIOLOGY CONSULT NOTE  ?Patient ID: ?Brittney Ramirez ?MRN: [id number] ?DOB/AGE: [birth year] 81 y.o. ? ?Admit date: 11/24/2021 ?Referring Physician Ralene Muskrat ?Primary Physician Adin Hector, MD ?Primary Cardiologist Dr Nehemiah Massed ?Reason for Consultation HFpEF exacerbation ? ?HPI:  ?Brittney Ramirez is a 81 year old female with a history of HFpEF, moderate to severe MR, hypertension, hyperlipidemia, OSA not on CPAP, CKD 3, dementia who was admitted with altered mental status and shortness of breath. ? ?Notably, she was hospitalized from 10/17/2021 until 10/19/2021 with a right hip fracture.  She underwent surgery at that time and finished a course of rehab.  To her hospitalization on 11/24/2021 she was noted to have increased shortness of breath that was progressively worsening, as well as a mild dry cough.  In the setting she had worsened mental status with increased confusion and is oriented only to time and place. ? ?Since admission to the hospital she underwent a CTA which was negative for pulmonary embolism, was started on IV diuretics for decompensated heart failure.  She has diuresed well on her edema has improved and she is back on room air, though her renal function is worsened.  She continues to be quite encephalopathic and is not eating or drinking.  She has been started on IV antibiotics for a urinary tract infection which was started on 11/25/2021. ? ?At the time of my evaluation the patient is sleeping in bed and does not awake to voice or tactile stimuli.  Husband is at bedside who provides some of the collateral information. ? ?Review of systems complete and found to be negative unless listed above  ? ? ? ?Past Medical History:  ?Diagnosis Date  ? Aortic atherosclerosis ([place])   ? Arthritis   ? Atrial fibrillation ([place])   ? a.) CHA2DS2-VASc = 6 (age x 2, sex, CHF, HTN, aortic plaque). b.) rate/rhythm maintained on oral metoprolol succinate; chronically anticoagulated using dose reduced  rivaroxaban d/t CKD.  ? Basal cell carcinoma   ? CHF (congestive heart failure) ([place]) 10/26/2014  ? a.)  TTE 10/26/2014: EF 45%; mild AR/PR; moderate MR/TR. b.) TTE 12/03/2016 mild LV dysfunction with LVH; EF 40%; global HK; severe LA and moderate RA enlargement; mild AR/PR, moderate MR/TR. c.)  TTE 01/25/2020: EF 45%; global HK; moderate LAE and mild RA enlargement; mild RV enlargement; trivial PR, mild AR, moderate to severe MR, moderate TR.  ? CKD (chronic kidney disease), stage III ([place])   ? Coronary artery calcification seen on CT scan   ? Dementia ([place])   ? a.) on donepezil + apoaequorin  ? Diverticulosis   ? DOE (dyspnea on exertion)   ? Fatty infiltration of liver   ? Full dentures   ? GERD (gastroesophageal reflux disease)   ? Hyperlipidemia   ? Hypertension   ? Hypothyroidism   ? Jaw fracture ([place]) 02/1995  ? a.) BILATERAL subcondylar fractures  ? Long term current use of anticoagulant   ? a.) rivaroxaban; dose reduced d/t CKD  ? Miscarriage   ? OSA (obstructive sleep apnea)   ? a.) does not require nocturnal PAP therapy  ? Osteoporosis   ? Pulmonary nodules   ? Thoracic aortic aneurysm (TAA) ([place]) 12/14/2015  ? a.) CT 12/14/2015: measured 4.2 cm. b.) CT 11/28/2016: not appreciated by radiologist, however CT reviewed by vascular and 4.2-4.3 cm aneurysmal dilitation noted.  ? Thyroid cyst   ?  ?Past Surgical History:  ?Procedure Laterality Date  ? CATARACT EXTRACTION Right   ? CHOLECYSTECTOMY  2014  ? COLONOSCOPY    ? JOINT REPLACEMENT Bilateral 2003  ? knees  ? TOTAL HIP ARTHROPLASTY Right 10/17/2021  ? Procedure: TOTAL HIP ARTHROPLASTY ANTERIOR APPROACH;  Surgeon: Hessie Knows, MD;  Location: [facility] ORS;  Service: Orthopedics;  Laterality: Right;  ?  ?Medications Prior to Admission  ?Medication Sig Dispense Refill Last Dose  ? Apoaequorin (PREVAGEN) 10 MG CAPS Take 10 mg by mouth daily.   11/23/2021  ? atorvastatin (LIPITOR) 40 MG tablet Take 1 tablet (40 mg total) by mouth at bedtime. 90 tablet 0 11/23/2021  ?  erythromycin ophthalmic ointment Place 1 application into the left eye in the morning and at bedtime.   11/23/2021 at 1900  ? latanoprost (XALATAN) 0.005 % ophthalmic solution Place 1 drop into both eyes at bedtime.   11/23/2021 at 1900  ? levothyroxine (SYNTHROID) 88 MCG tablet Take 1 tablet (88 mcg total) by mouth daily before breakfast. 90 tablet 0 11/23/2021  ? metoprolol succinate (TOPROL-XL) 100 MG 24 hr tablet Take 100 mg by mouth daily.   11/23/2021 at 0900  ? vitamin B-12 (CYANOCOBALAMIN) 1000 MCG tablet Take 1,000 mcg by mouth daily.   11/23/2021 at 0900  ? XARELTO 15 MG TABS tablet Take 15 mg by mouth daily.   11/23/2021 at 1900  ? acetaminophen (TYLENOL) 500 MG tablet Take 2 tablets (1,000 mg total) by mouth every 6 (six) hours. 30 tablet 0 prn at unknown  ? docusate sodium (COLACE) 100 MG capsule Take 1 capsule (100 mg total) by mouth 2 (two) times daily. 10 capsule 0 prn at unknown  ? donepezil (ARICEPT) 5 MG tablet Take 5 mg by mouth at bedtime. (Patient not taking: Reported on 11/24/2021)   Not Taking  ? ferrous [id number] C-folic acid (TRINSICON / FOLTRIN) capsule Take 1 capsule by mouth 2 (two) times daily after a meal. (Patient not taking: Reported on 11/24/2021) 20 capsule 0 Not Taking  ? ferrous sulfate 325 (65 FE) MG EC tablet Take 325 mg by mouth 2 (two) times a week. (Patient not taking: Reported on 11/24/2021)   Not Taking  ? HYDROcodone-acetaminophen (NORCO) 7.5-325 MG tablet Take 0.5-1 tablets by mouth every 4 (four) hours as needed for severe pain (pain score 7-10). (Patient not taking: Reported on 11/24/2021) 30 tablet 0 Not Taking  ? methocarbamol (ROBAXIN) 500 MG tablet Take 1 tablet (500 mg total) by mouth every 6 (six) hours as needed for muscle spasms. 30 tablet 0 prn at unknown  ? ondansetron (ZOFRAN) 4 MG tablet Take 1 tablet (4 mg total) by mouth every 6 (six) hours as needed for nausea. 20 tablet 0 prn at unknown  ? polyethylene glycol (MIRALAX / GLYCOLAX) 17 g packet Take 17 g by mouth  daily as needed for mild constipation. 14 each 0 prn at unknown  ? valsartan-hydrochlorothiazide (DIOVAN-HCT) 160-25 MG tablet Take 1 tablet by mouth daily. 90 tablet 0   ? ?Social History  ? ?Socioeconomic History  ? Marital status: Married  ?  Spouse name: Charlotte Crumb  ? Number of children: Not on file  ? Years of education: Not on file  ? Highest education level: Some college, no degree  ?Occupational History  ? Occupation: retired  ?Tobacco Use  ? Smoking status: Former  ?  Types: Cigarettes  ?  Start date: 02/07/1955  ?  Quit date: 02/08/1955  ?  Years since quitting: 66.8  ? Smokeless tobacco: Never  ?Vaping Use  ? Vaping Use: Never used  ?Substance and Sexual Activity  ?  Alcohol use: No  ?  Alcohol/week: 0.0 standard drinks  ? Drug use: No  ? Sexual activity: Not on file  ?Other Topics Concern  ? Not on file  ?Social History Narrative  ? Not on file  ? ?Social Determinants of Health  ? ?Financial Resource Strain: Not on file  ?Food Insecurity: Not on file  ?Transportation Needs: Not on file  ?Physical Activity: Not on file  ?Stress: Not on file  ?Social Connections: Not on file  ?Intimate Partner Violence: Not on file  ?  ?Family History  ?Problem Relation Age of Onset  ? Heart disease Mother   ? Hypertension Mother   ? Heart disease Father   ? Hypertension Father   ? Arthritis Sister   ? Cancer Sister   ? Diabetes Sister   ? Heart disease Sister   ? Breast cancer Sister 65  ? Diabetes Brother   ? Heart disease Brother   ? Hypertension Brother   ?  ? ? ?Review of systems complete and found to be negative unless listed above  ? ? ? ? ?PHYSICAL EXAM ? ?General: Well developed, well nourished, in no acute distress ?HEENT:  Normocephalic and atramatic ?Neck:  No JVD.  ?Lungs: Clear bilaterally to auscultation and percussion. ?Heart: Irregularly irregular. 2/6 systolic murmur ?Abdomen: Bowel sounds are positive, abdomen soft and non-tender  ?Msk:  Back normal, normal gait. Normal strength and tone for age. ?Extremities:  No clubbing, cyanosis or edema.   ?Neuro: Alert and oriented X 3. ?Psych:  Good affect, responds appropriately ? ?Labs: ?  ?Lab Results  ?Component Value Date  ? WBC 11.2 (H) 11/26/2021  ? HGB 11.2 (L) 04/11

## 2021-11-27 ENCOUNTER — Inpatient Hospital Stay: Payer: Medicare HMO

## 2021-11-27 DIAGNOSIS — I639 Cerebral infarction, unspecified: Secondary | ICD-10-CM | POA: Diagnosis not present

## 2021-11-27 DIAGNOSIS — I6782 Cerebral ischemia: Secondary | ICD-10-CM | POA: Diagnosis not present

## 2021-11-27 DIAGNOSIS — R945 Abnormal results of liver function studies: Secondary | ICD-10-CM | POA: Diagnosis not present

## 2021-11-27 DIAGNOSIS — I5033 Acute on chronic diastolic (congestive) heart failure: Secondary | ICD-10-CM | POA: Diagnosis not present

## 2021-11-27 DIAGNOSIS — J90 Pleural effusion, not elsewhere classified: Secondary | ICD-10-CM | POA: Diagnosis not present

## 2021-11-27 DIAGNOSIS — Z9049 Acquired absence of other specified parts of digestive tract: Secondary | ICD-10-CM | POA: Diagnosis not present

## 2021-11-27 DIAGNOSIS — G319 Degenerative disease of nervous system, unspecified: Secondary | ICD-10-CM | POA: Diagnosis not present

## 2021-11-27 LAB — ACETAMINOPHEN LEVEL: Acetaminophen (Tylenol), Serum: <10 ug/mL — ABNORMAL LOW (ref 10–30)

## 2021-11-27 LAB — CBC
HCT: 33.1 % — ABNORMAL LOW (ref 36.0–46.0)
Hemoglobin: 10 g/dL — ABNORMAL LOW (ref 12.0–15.0)
MCH: 29.7 pg (ref 26.0–34.0)
MCHC: 30.2 g/dL (ref 30.0–36.0)
MCV: 98.2 fL (ref 80.0–100.0)
Platelets: 165 10*3/uL (ref 150–400)
RBC: 3.37 MIL/uL — ABNORMAL LOW (ref 3.87–5.11)
RDW: 17.6 % — ABNORMAL HIGH (ref 11.5–15.5)
WBC: 7.9 10*3/uL (ref 4.0–10.5)
nRBC: 0.5 % — ABNORMAL HIGH (ref 0.0–0.2)

## 2021-11-27 LAB — COMPREHENSIVE METABOLIC PANEL
ALT: 1107 U/L — ABNORMAL HIGH (ref 0–44)
AST: 1407 U/L — ABNORMAL HIGH (ref 15–41)
Albumin: 3.4 g/dL — ABNORMAL LOW (ref 3.5–5.0)
Alkaline Phosphatase: 102 U/L (ref 38–126)
Anion gap: 14 (ref 5–15)
BUN: 47 mg/dL — ABNORMAL HIGH (ref 8–23)
CO2: 22 mmol/L (ref 22–32)
Calcium: 8.6 mg/dL — ABNORMAL LOW (ref 8.9–10.3)
Chloride: 111 mmol/L (ref 98–111)
Creatinine, Ser: 2.08 mg/dL — ABNORMAL HIGH (ref 0.44–1.00)
GFR, Estimated: 23 mL/min — ABNORMAL LOW (ref >60)
Glucose, Bld: 124 mg/dL — ABNORMAL HIGH (ref 70–99)
Potassium: 3.4 mmol/L — ABNORMAL LOW (ref 3.5–5.1)
Sodium: 147 mmol/L — ABNORMAL HIGH (ref 135–145)
Total Bilirubin: 1.4 mg/dL — ABNORMAL HIGH (ref 0.3–1.2)
Total Protein: 6.3 g/dL — ABNORMAL LOW (ref 6.5–8.1)

## 2021-11-27 LAB — URINE CULTURE

## 2021-11-27 LAB — GLUCOSE, CAPILLARY
Glucose-Capillary: 126 mg/dL — ABNORMAL HIGH (ref 70–99)
Glucose-Capillary: 131 mg/dL — ABNORMAL HIGH (ref 70–99)

## 2021-11-27 LAB — AMMONIA: Ammonia: 26 umol/L (ref 9–35)

## 2021-11-27 MED ORDER — METOPROLOL TARTRATE 5 MG/5ML IV SOLN
5.0000 mg | Freq: Once | INTRAVENOUS | Status: DC
Start: 2021-11-27 — End: 2021-11-28

## 2021-11-27 MED ORDER — DILTIAZEM HCL-DEXTROSE 125-5 MG/125ML-% IV SOLN (PREMIX)
5.0000 mg/h | INTRAVENOUS | Status: DC
  Administered 2021-11-27 – 2021-11-28 (×2): 5 mg/h via INTRAVENOUS
  Filled 2021-11-27 (×3): qty 125

## 2021-11-27 MED ORDER — METOPROLOL TARTRATE 50 MG PO TABS
50.0000 mg | ORAL_TABLET | Freq: Two times a day (BID) | ORAL | Status: DC
  Administered 2021-11-28 – 2021-12-03 (×4): 50 mg via ORAL
  Filled 2021-11-27 (×8): qty 1

## 2021-11-27 MED ORDER — ENOXAPARIN SODIUM 30 MG/0.3ML IJ SOSY: 30.0000 mg | PREFILLED_SYRINGE | INTRAMUSCULAR | Status: DC

## 2021-11-27 MED ORDER — ENOXAPARIN SODIUM 100 MG/ML IJ SOSY: 1.0000 mg/kg | PREFILLED_SYRINGE | INTRAMUSCULAR | Status: DC

## 2021-11-27 MED ORDER — DEXTROSE 5 % IV SOLN: INTRAVENOUS | Status: DC

## 2021-11-27 MED ORDER — ENOXAPARIN SODIUM 100 MG/ML IJ SOSY
1.0000 mg/kg | PREFILLED_SYRINGE | INTRAMUSCULAR | Status: DC
  Administered 2021-11-27: 87.5 mg via SUBCUTANEOUS
  Filled 2021-11-27: qty 0.88

## 2021-11-27 MED ORDER — POTASSIUM CHLORIDE 10 MEQ/100ML IV SOLN
10.0000 meq | INTRAVENOUS | Status: AC
  Administered 2021-11-27 – 2021-11-28 (×4): 10 meq via INTRAVENOUS
  Filled 2021-11-27: qty 400

## 2021-11-27 MED ORDER — METOPROLOL TARTRATE 5 MG/5ML IV SOLN
5.0000 mg | Freq: Once | INTRAVENOUS | Status: AC
Start: 2021-11-27 — End: 2021-11-27
  Administered 2021-11-27: 5 mg via INTRAVENOUS
  Filled 2021-11-27: qty 5

## 2021-11-27 MED ORDER — METOPROLOL TARTRATE 5 MG/5ML IV SOLN
5.0000 mg | Freq: Once | INTRAVENOUS | Status: AC
  Administered 2021-11-27: 5 mg via INTRAVENOUS
  Filled 2021-11-27: qty 5

## 2021-11-27 NOTE — Consult Note (Signed)
[facility] ? ?CARDIOLOGY CONSULT NOTE  ?Patient ID: ?Brittney Ramirez ?MRN: [id number] ?DOB/AGE: [birth year] 81 y.o. ? ?Admit date: 11/24/2021 ?Referring Physician Ralene Muskrat ?Primary Physician Adin Hector, MD ?Primary Cardiologist Dr Nehemiah Massed ?Reason for Consultation HFpEF exacerbation ? ?HPI:  ?Brittney Ramirez is a 81 year old female with a history of HFpEF, moderate to severe MR, hypertension, hyperlipidemia, OSA not on CPAP, CKD 3, dementia who was admitted with altered mental status and shortness of breath. Cardiology consulted for assistance with AF.  ? ?Interval history:\ ?- Became agitated, with increased HR to 170's. Transferred to ICU and given IV haldol and IV diltiazem.  ?- Continues to not eat or drink.  ? ?Review of systems complete and found to be negative unless listed above  ? ? ? ?Past Medical History:  ?Diagnosis Date  ? Aortic atherosclerosis ([place])   ? Arthritis   ? Atrial fibrillation ([place])   ? a.) CHA2DS2-VASc = 6 (age x 2, sex, CHF, HTN, aortic plaque). b.) rate/rhythm maintained on oral metoprolol succinate; chronically anticoagulated using dose reduced rivaroxaban d/t CKD.  ? Basal cell carcinoma   ? CHF (congestive heart failure) ([place]) 10/26/2014  ? a.)  TTE 10/26/2014: EF 45%; mild AR/PR; moderate MR/TR. b.) TTE 12/03/2016 mild LV dysfunction with LVH; EF 40%; global HK; severe LA and moderate RA enlargement; mild AR/PR, moderate MR/TR. c.)  TTE 01/25/2020: EF 45%; global HK; moderate LAE and mild RA enlargement; mild RV enlargement; trivial PR, mild AR, moderate to severe MR, moderate TR.  ? CKD (chronic kidney disease), stage III ([place])   ? Coronary artery calcification seen on CT scan   ? Dementia ([place])   ? a.) on donepezil + apoaequorin  ? Diverticulosis   ? DOE (dyspnea on exertion)   ? Fatty infiltration of liver   ? Full dentures   ? GERD (gastroesophageal reflux disease)   ? Hyperlipidemia   ? Hypertension   ? Hypothyroidism   ? Jaw fracture ([place]) 02/1995  ? a.) BILATERAL  subcondylar fractures  ? Long term current use of anticoagulant   ? a.) rivaroxaban; dose reduced d/t CKD  ? Miscarriage   ? OSA (obstructive sleep apnea)   ? a.) does not require nocturnal PAP therapy  ? Osteoporosis   ? Pulmonary nodules   ? Thoracic aortic aneurysm (TAA) ([place]) 12/14/2015  ? a.) CT 12/14/2015: measured 4.2 cm. b.) CT 11/28/2016: not appreciated by radiologist, however CT reviewed by vascular and 4.2-4.3 cm aneurysmal dilitation noted.  ? Thyroid cyst   ?  ?Past Surgical History:  ?Procedure Laterality Date  ? CATARACT EXTRACTION Right   ? CHOLECYSTECTOMY  2014  ? COLONOSCOPY    ? JOINT REPLACEMENT Bilateral 2003  ? knees  ? TOTAL HIP ARTHROPLASTY Right 10/17/2021  ? Procedure: TOTAL HIP ARTHROPLASTY ANTERIOR APPROACH;  Surgeon: Hessie Knows, MD;  Location: [facility] ORS;  Service: Orthopedics;  Laterality: Right;  ?  ?Medications Prior to Admission  ?Medication Sig Dispense Refill Last Dose  ? Apoaequorin (PREVAGEN) 10 MG CAPS Take 10 mg by mouth daily.   11/23/2021  ? atorvastatin (LIPITOR) 40 MG tablet Take 1 tablet (40 mg total) by mouth at bedtime. 90 tablet 0 11/23/2021  ? erythromycin ophthalmic ointment Place 1 application into the left eye in the morning and at bedtime.   11/23/2021 at 1900  ? latanoprost (XALATAN) 0.005 % ophthalmic solution Place 1 drop into both eyes at bedtime.   11/23/2021 at 1900  ? levothyroxine (SYNTHROID) 88 MCG tablet Take  1 tablet (88 mcg total) by mouth daily before breakfast. 90 tablet 0 11/23/2021  ? metoprolol succinate (TOPROL-XL) 100 MG 24 hr tablet Take 100 mg by mouth daily.   11/23/2021 at 0900  ? vitamin B-12 (CYANOCOBALAMIN) 1000 MCG tablet Take 1,000 mcg by mouth daily.   11/23/2021 at 0900  ? XARELTO 15 MG TABS tablet Take 15 mg by mouth daily.   11/23/2021 at 1900  ? acetaminophen (TYLENOL) 500 MG tablet Take 2 tablets (1,000 mg total) by mouth every 6 (six) hours. 30 tablet 0 prn at unknown  ? docusate sodium (COLACE) 100 MG capsule Take 1 capsule (100 mg total) by  mouth 2 (two) times daily. 10 capsule 0 prn at unknown  ? donepezil (ARICEPT) 5 MG tablet Take 5 mg by mouth at bedtime. (Patient not taking: Reported on 11/24/2021)   Not Taking  ? ferrous [id number] C-folic acid (TRINSICON / FOLTRIN) capsule Take 1 capsule by mouth 2 (two) times daily after a meal. (Patient not taking: Reported on 11/24/2021) 20 capsule 0 Not Taking  ? ferrous sulfate 325 (65 FE) MG EC tablet Take 325 mg by mouth 2 (two) times a week. (Patient not taking: Reported on 11/24/2021)   Not Taking  ? HYDROcodone-acetaminophen (NORCO) 7.5-325 MG tablet Take 0.5-1 tablets by mouth every 4 (four) hours as needed for severe pain (pain score 7-10). (Patient not taking: Reported on 11/24/2021) 30 tablet 0 Not Taking  ? methocarbamol (ROBAXIN) 500 MG tablet Take 1 tablet (500 mg total) by mouth every 6 (six) hours as needed for muscle spasms. 30 tablet 0 prn at unknown  ? ondansetron (ZOFRAN) 4 MG tablet Take 1 tablet (4 mg total) by mouth every 6 (six) hours as needed for nausea. 20 tablet 0 prn at unknown  ? polyethylene glycol (MIRALAX / GLYCOLAX) 17 g packet Take 17 g by mouth daily as needed for mild constipation. 14 each 0 prn at unknown  ? valsartan-hydrochlorothiazide (DIOVAN-HCT) 160-25 MG tablet Take 1 tablet by mouth daily. 90 tablet 0   ? ?Social History  ? ?Socioeconomic History  ? Marital status: Married  ?  Spouse name: Charlotte Crumb  ? Number of children: Not on file  ? Years of education: Not on file  ? Highest education level: Some college, no degree  ?Occupational History  ? Occupation: retired  ?Tobacco Use  ? Smoking status: Former  ?  Types: Cigarettes  ?  Start date: 02/07/1955  ?  Quit date: 02/08/1955  ?  Years since quitting: 66.8  ? Smokeless tobacco: Never  ?Vaping Use  ? Vaping Use: Never used  ?Substance and Sexual Activity  ? Alcohol use: No  ?  Alcohol/week: 0.0 standard drinks  ? Drug use: No  ? Sexual activity: Not on file  ?Other Topics Concern  ? Not on file  ?Social History  Narrative  ? Not on file  ? ?Social Determinants of Health  ? ?Financial Resource Strain: Not on file  ?Food Insecurity: Not on file  ?Transportation Needs: Not on file  ?Physical Activity: Not on file  ?Stress: Not on file  ?Social Connections: Not on file  ?Intimate Partner Violence: Not on file  ?  ?Family History  ?Problem Relation Age of Onset  ? Heart disease Mother   ? Hypertension Mother   ? Heart disease Father   ? Hypertension Father   ? Arthritis Sister   ? Cancer Sister   ? Diabetes Sister   ? Heart disease Sister   ?  Breast cancer Sister 104  ? Diabetes Brother   ? Heart disease Brother   ? Hypertension Brother   ?  ? ? ?Review of systems complete and found to be negative unless listed above  ? ? ? ? ?PHYSICAL EXAM ? ?General: Malnourished. Appears ill.  ?HEENT:  Normocephalic and atramatic ?Neck:  No JVD.  ?Lungs: Clear bilaterally to auscultation and percussion. ?Heart: Irregularly irregular. 2/6 systolic murmur ?Abdomen: Bowel sounds are positive, abdomen soft and non-tender  ?Msk:  Back normal, normal gait. Normal strength and tone for age. ?Extremities: No clubbing, cyanosis or edema.   ?Neuro: Unable to assess given mental status ?Psych:  Does not respond to questioning.  ? ?Labs: ?  ?Lab Results  ?Component Value Date  ? WBC 7.9 11/27/2021  ? HGB 10.0 (L) 11/27/2021  ? HCT 33.1 (L) 11/27/2021  ? MCV 98.2 11/27/2021  ? PLT 165 11/27/2021  ?  ?Recent Labs  ?Lab 11/27/21 ?1123  ?NA 147*  ?K 3.4*  ?CL 111  ?CO2 22  ?BUN 47*  ?CREATININE 2.08*  ?CALCIUM 8.6*  ?PROT 6.3*  ?BILITOT 1.4*  ?ALKPHOS 102  ?ALT 1,107*  ?AST 1,407*  ?GLUCOSE 124*  ? ? ?Lab Results  ?Component Value Date  ? CKTOTAL 59 09/18/2020  ?  ?Lab Results  ?Component Value Date  ? CHOL 154 06/17/2018  ? CHOL 149 12/10/2017  ? CHOL 156 06/11/2017  ? ?Lab Results  ?Component Value Date  ? HDL 50 06/17/2018  ? HDL 49 06/11/2017  ? HDL 55 06/10/2016  ? ?Lab Results  ?Component Value Date  ? [place] 76 06/17/2018  ? [place] 77 06/11/2017  ?  [place] 76 06/10/2016  ? ?Lab Results  ?Component Value Date  ? TRIG 139 06/17/2018  ? TRIG 133 12/10/2017  ? TRIG 149 06/11/2017  ? ?Lab Results  ?Component Value Date  ? CHOLHDL 3.1 06/17/2018  ? CHOLHDL 3.2 10/25/2

## 2021-11-27 NOTE — Progress Notes (Signed)
?PROGRESS NOTE ? ? ? ?Brittney Ramirez  LKG:[id number] DOB: Jun 26, 1941 DOA: 11/24/2021 ?PCP: Adin Hector, MD  ? ? ?Brief Narrative:  ?hypothyroidism, OSA not on CPAP, CAD, CKD-3, CHF, atrial fibrillation on Xarelto, dementia, iron deficiency anemia, who presents with shortness of breath and altered mental status. ?  ?Patient was recently hospitalized from 3/2 - 3/4 due to right hip fracture.  Patient is s/p of right hip surgery. She finished course of rehab.  Per her son and husband at the bedside, in the past 2 days, patient has shortness breath which has been progressively worsening.  No chest pain, fever or chills.  Has mild dry cough.  Patient has history of dementia, but most of the time patient recognizes family members, is oriented to time and place. Patient is more confused today. ?  ?Pulmonary embolism ruled out.  Presentation consistent with acute decompensated heart failure.  Started on IV diuretics.  Atrial fibrillation with uncontrolled rate ? Volume status improved however kidney function worsen and dehydration noted on 4/11 ? ? ?4/12 patient had rapid response earlier this morning with A-fib RVR heart rate in 130s to 140s.  Patient was transferred to stepdown started on Cardizem drip. ?This am pt confused, not following commands. Has not taken po intake yet. Not able to give some of her po meds . MRI this am with small remote lacunar infarct of rt thalamus.  ?Tele afib rate 80's to 90's on  cardizem gtt. ? ? ?Consultants:  ?cardiology ? ?Procedures: CT , MRI ? ?Antimicrobials:  ?Rocephin, azithromycin ? ? ?Subjective: ?Confused.  ? ?Objective: ?Vitals:  ? 11/27/21 0600 11/27/21 0615 11/27/21 0630 11/27/21 0645  ?BP: (!) 155/119 (!) 138/102 (!) 147/81 (!) 153/75  ?Pulse: (!) 108 92 99 (!) 33  ?Resp: (!) 22 (!) 25 (!) 38 (!) 40  ?Temp:      ?TempSrc:      ?SpO2: 100% 99% 99% 97%  ?Weight:      ?Height:      ? ? ?Intake/Output Summary (Last 24 hours) at 11/27/2021 0919 ?Last data filed at 11/27/2021  0600 ?Gross per 24 hour  ?Intake 416.65 ml  ?Output 300 ml  ?Net 116.65 ml  ? ?Filed Weights  ? 11/25/21 0500 11/26/21 0453 11/27/21 0500  ?Weight: 86.1 kg 84.6 kg 86.3 kg  ? ? ?Examination: ? ?General exam: Appears calm , twiching shoulders, riveting back and forth in bed, appears Dry ?Respiratory system: anteriorly cta with poor respiratory effort. ?Cardiovascular system: Irregular, s1/s2 mildly tachy. No gallop ?Gastrointestinal system: Abdomen is nondistended, soft and nontender. +bs ?Central nervous system: confused, doesn't follow command.  ?Extremities: trace pedal edema ?Psychiatry:Mood & affect appropriate in current setting.  ? ? ? ?Data Reviewed: I have personally reviewed following labs and imaging studies ? ?CBC: ?Recent Labs  ?Lab 11/24/21 ?0900 11/25/21 ?0530 11/26/21 ?0907  ?WBC 5.1 7.3 11.2*  ?NEUTROABS  --   --  8.6*  ?HGB 10.1* 11.2* 11.2*  ?HCT 34.7* 37.7 37.9  ?MCV 100.6* 100.5* 100.3*  ?PLT 191 207 195  ? ?Basic Metabolic Panel: ?Recent Labs  ?Lab 11/24/21 ?0900 11/24/21 ?1158 11/25/21 ?0530 11/26/21 ?0907  ?NA 142  --  144 147*  ?K 3.1*  --  4.0 4.2  ?CL 109  --  110 109  ?CO2 25  --  20* 20*  ?GLUCOSE 93  --  126* 101*  ?BUN 18  --  19 30*  ?CREATININE 0.98  --  1.25* 1.63*  ?CALCIUM 8.9  --  9.2 9.3  ?MG  --  1.7 1.8 1.9  ? ?GFR: ?Estimated Creatinine Clearance: 29.4 mL/min (A) (by C-G formula based on SCr of 1.63 mg/dL (H)). ?Liver Function Tests: ?Recent Labs  ?Lab 11/24/21 ?0900  ?AST 21  ?ALT 10  ?ALKPHOS 87  ?BILITOT 1.5*  ?PROT 6.3*  ?ALBUMIN 3.4*  ? ?No results for input(s): LIPASE, AMYLASE in the last 168 hours. ?No results for input(s): AMMONIA in the last 168 hours. ?Coagulation Profile: ?No results for input(s): INR, PROTIME in the last 168 hours. ?Cardiac Enzymes: ?No results for input(s): CKTOTAL, CKMB, CKMBINDEX, TROPONINI in the last 168 hours. ?BNP (last 3 results) ?No results for input(s): PROBNP in the last 8760 hours. ?HbA1C: ?No results for input(s): HGBA1C in the last 72  hours. ?CBG: ?Recent Labs  ?Lab 11/25/21 ?0902 11/26/21 ?5621  ?GLUCAP 116* 99  ? ?Lipid Profile: ?No results for input(s): CHOL, HDL, LDLCALC, TRIG, CHOLHDL, LDLDIRECT in the last 72 hours. ?Thyroid Function Tests: ?No results for input(s): TSH, T4TOTAL, FREET4, T3FREE, THYROIDAB in the last 72 hours. ?Anemia Panel: ?No results for input(s): VITAMINB12, FOLATE, FERRITIN, TIBC, IRON, RETICCTPCT in the last 72 hours. ?Sepsis Labs: ?Recent Labs  ?Lab 11/24/21 ?1029 11/24/21 ?1610  ?PROCALCITON  --  <0.10  ?LATICACIDVEN 1.5 2.0*  ? ? ?Recent Results (from the past 240 hour(s))  ?Blood Culture (routine x 2)     Status: None (Preliminary result)  ? Collection Time: 11/24/21 10:29 AM  ? Specimen: BLOOD  ?Result Value Ref Range Status  ? Specimen Description BLOOD LEFT ANTECUBITAL  Final  ? Special Requests   Final  ?  BOTTLES DRAWN AEROBIC AND ANAEROBIC Blood Culture adequate volume  ? Culture   Final  ?  NO GROWTH 3 DAYS ?Performed at [facility], [address], [place], [place] [address] ?  ? Report Status PENDING  Incomplete  ?Blood Culture (routine x 2)     Status: None (Preliminary result)  ? Collection Time: 11/24/21 10:29 AM  ? Specimen: BLOOD  ?Result Value Ref Range Status  ? Specimen Description BLOOD RIGHT ANTECUBITAL  Final  ? Special Requests   Final  ?  BOTTLES DRAWN AEROBIC AND ANAEROBIC Blood Culture adequate volume  ? Culture   Final  ?  NO GROWTH 3 DAYS ?Performed at [facility], [address], [place], [place] [address] ?  ? Report Status PENDING  Incomplete  ?Resp Panel by RT-PCR (Flu A&B, Covid) Nasopharyngeal Swab     Status: None  ? Collection Time: 11/24/21 11:20 AM  ? Specimen: Nasopharyngeal Swab; Nasopharyngeal(NP) swabs in vial transport medium  ?Result Value Ref Range Status  ? SARS Coronavirus 2 by RT PCR NEGATIVE NEGATIVE Final  ?  Comment: (NOTE) ?SARS-CoV-2 target nucleic acids are NOT DETECTED. ? ?The SARS-CoV-2 RNA is generally detectable in upper  respiratory ?specimens during the acute phase of infection. The lowest ?concentration of SARS-CoV-2 viral copies this assay can detect is ?138 copies/mL. A negative result does not preclude SARS-Cov-2 ?infection and should not be used as the sole basis for treatment or ?other patient management decisions. A negative result may occur with  ?improper specimen collection/handling, submission of specimen other ?than nasopharyngeal swab, presence of viral mutation(s) within the ?areas targeted by this assay, and inadequate number of viral ?copies(<138 copies/mL). A negative result must be combined with ?clinical observations, patient history, and epidemiological ?information. The expected result is Negative. ? ?Fact Sheet for Patients:  ?EntrepreneurPulse.com.au ? ?Fact Sheet for Healthcare Providers:  ?  IncredibleEmployment.be ? ?This test is no t yet approved or cleared by the [place] FDA and  ?has been authorized for detection and/or diagnosis of SARS-CoV-2 by ?FDA under an Emergency Use Authorization (EUA). This EUA will remain  ?in effect (meaning this test can be used) for the duration of the ?COVID-19 declaration under Section 564(b)(1) of the Act, 21 ?U.S.C.section 360bbb-3(b)(1), unless the authorization is terminated  ?or revoked sooner.  ? ? ?  ? Influenza A by PCR NEGATIVE NEGATIVE Final  ? Influenza B by PCR NEGATIVE NEGATIVE Final  ?  Comment: (NOTE) ?The Xpert Xpress SARS-CoV-2/FLU/RSV plus assay is intended as an aid ?in the diagnosis of influenza from Nasopharyngeal swab specimens and ?should not be used as a sole basis for treatment. Nasal washings and ?aspirates are unacceptable for Xpert Xpress SARS-CoV-2/FLU/RSV ?testing. ? ?Fact Sheet for Patients: ?EntrepreneurPulse.com.au ? ?Fact Sheet for Healthcare Providers: ?IncredibleEmployment.be ? ?This test is not yet approved or cleared by the [place] FDA and ?has been  authorized for detection and/or diagnosis of SARS-CoV-2 by ?FDA under an Emergency Use Authorization (EUA). This EUA will remain ?in effect (meaning this test can be used) for the duration of the ?COVID-19 declaration under Sec

## 2021-11-27 NOTE — Progress Notes (Signed)
Son Brittney Ramirez informed of transfer to ICU 10. ?

## 2021-11-27 NOTE — Significant Event (Signed)
Rapid Response Event Note  ? ?Reason for Call :  ? ?Afib RVR in the 160s ?Initial Focused Assessment:  ?Patient agitated in the bed pulling at EKG and [place] wires. Hr in the 150s-160 BP 151/100 RR 30 O2 100 on RA. Patient confused unable to answer questions. Primary nurse informed rapid nurse that this is how her mental status has been since arrival to the hospital. Neomia Glass at bedsdie on Mayer arrival to bedside ? ? ? ? ?Interventions:  ?-EKG Gotten ?-'5mg'$  of metoprolol given IV X 2 ?-Haldol '2mg'$  given IM ?-Therapeutic communication ?HR 120s-145s after medication BP 200-150/ 90-110. ?Plan of Care:  ?-Transfer to [facility] for Cardizem GTTS ? ? ?Event Summary:  ? ?MD Notified: [phone] ?Call JHER:[phone] ?Arrival [id number] ?End [id number] ? ?Gonzella Lex, RN ?

## 2021-11-27 NOTE — Consult Note (Signed)
Consultation ? ?Referring Provider:     Dr Kurtis Bushman ?Admit date 11/24/21 ?Consult date        4 12/23 ?Reason for Consultation:     elevated transaminases ?       ? HPI:   ?Brittney Ramirez is a 81 y.o. female with medical history significant of hypertension, hyperlipidemia, GERD, hypothyroidism, OSA not on CPAP, CAD, CKD-3, CHF,  moderate to severe MR, atrial fibrillation on Xarelto, dementia, iron deficiency anemia, admitted shortness of breath and altered mental status. Had THR last month- finished a course at rehab lives at [facility] PE has been found on imaging- was treated with rochephin and azithromycin for possible pneuemonia but it is felt she had acute CHF exacrebation/pulmonary edema ?he also had afib exacerbation, was transferred to ICU and is on dilitazem drip ?GI has been consulted for elevated transaminases-  these were normal on admission but have elevated acutely today -ast 1407, alt 1107. Alp normal, bili 1.4.; platelets normal. She also has some increase in renal insufficiency. Abdominal US has been ordered today ?Acetaminophen hydrocodone with acetaminophen are noted on her home med list. Unable to obtain history from patient so history obtained from chart review, nurse and son ?Patient has no history of liver disease and no family history of liver disease. She does not use etoh/illicits/has no tattoos/use herbals, been incarcerated. Son is unsure how much acetaminophen patient has been using. She has no history of jaundice/ascites. She has been confused and mostly somnolent since Saturday night. Patient has had some htn up to 155/119 0600 was 251/182 at 0400. This has improved as has her tachycardia ? ?PREVIOUS ENDOSCOPIES:            ?Unk ? ?Past Medical History:  ?Diagnosis Date  ? Aortic atherosclerosis ([place])   ? Arthritis   ? Atrial fibrillation ([place])   ? a.) CHA2DS2-VASc = 6 (age x 2, sex, CHF, HTN, aortic plaque). b.) rate/rhythm maintained on oral metoprolol succinate; chronically anticoagulated  using dose reduced rivaroxaban d/t CKD.  ? Basal cell carcinoma   ? CHF (congestive heart failure) ([place]) 10/26/2014  ? a.)  TTE 10/26/2014: EF 45%; mild AR/PR; moderate MR/TR. b.) TTE 12/03/2016 mild LV dysfunction with LVH; EF 40%; global HK; severe LA and moderate RA enlargement; mild AR/PR, moderate MR/TR. c.)  TTE 01/25/2020: EF 45%; global HK; moderate LAE and mild RA enlargement; mild RV enlargement; trivial PR, mild AR, moderate to severe MR, moderate TR.  ? CKD (chronic kidney disease), stage III ([place])   ? Coronary artery calcification seen on CT scan   ? Dementia ([place])   ? a.) on donepezil + apoaequorin  ? Diverticulosis   ? DOE (dyspnea on exertion)   ? Fatty infiltration of liver   ? Full dentures   ? GERD (gastroesophageal reflux disease)   ? Hyperlipidemia   ? Hypertension   ? Hypothyroidism   ? Jaw fracture ([place]) 02/1995  ? a.) BILATERAL subcondylar fractures  ? Long term current use of anticoagulant   ? a.) rivaroxaban; dose reduced d/t CKD  ? Miscarriage   ? OSA (obstructive sleep apnea)   ? a.) does not require nocturnal PAP therapy  ? Osteoporosis   ? Pulmonary nodules   ? Thoracic aortic aneurysm (TAA) ([place]) 12/14/2015  ? a.) CT 12/14/2015: measured 4.2 cm. b.) CT 11/28/2016: not appreciated by radiologist, however CT reviewed by vascular and 4.2-4.3 cm aneurysmal dilitation noted.  ? Thyroid cyst   ? ? ?Past Surgical History:  ?Procedure  Laterality Date  ? CATARACT EXTRACTION Right   ? CHOLECYSTECTOMY  2014  ? COLONOSCOPY    ? JOINT REPLACEMENT Bilateral 2003  ? knees  ? TOTAL HIP ARTHROPLASTY Right 10/17/2021  ? Procedure: TOTAL HIP ARTHROPLASTY ANTERIOR APPROACH;  Surgeon: Hessie Knows, MD;  Location: [facility] ORS;  Service: Orthopedics;  Laterality: Right;  ? ? ?Family History  ?Problem Relation Age of Onset  ? Heart disease Mother   ? Hypertension Mother   ? Heart disease Father   ? Hypertension Father   ? Arthritis Sister   ? Cancer Sister   ? Diabetes Sister   ? Heart disease Sister   ? Breast  cancer Sister 23  ? Diabetes Brother   ? Heart disease Brother   ? Hypertension Brother   ?  ? ?Social History  ? ?Tobacco Use  ? Smoking status: Former  ?  Types: Cigarettes  ?  Start date: 02/07/1955  ?  Quit date: 02/08/1955  ?  Years since quitting: 66.8  ? Smokeless tobacco: Never  ?Vaping Use  ? Vaping Use: Never used  ?Substance Use Topics  ? Alcohol use: No  ?  Alcohol/week: 0.0 standard drinks  ? Drug use: No  ? ? ?Prior to Admission medications   ?Medication Sig Start Date End Date Taking? Authorizing Provider  ?Apoaequorin (PREVAGEN) 10 MG CAPS Take 10 mg by mouth daily.   Yes [provider]  ?atorvastatin (LIPITOR) 40 MG tablet Take 1 tablet (40 mg total) by mouth at bedtime. 07/29/19  Yes Volney American, PA-C  ?erythromycin ophthalmic ointment Place 1 application into the left eye in the morning and at bedtime.   Yes [provider]  ?latanoprost (XALATAN) 0.005 % ophthalmic solution Place 1 drop into both eyes at bedtime. 08/20/14  Yes [provider]  ?levothyroxine (SYNTHROID) 88 MCG tablet Take 1 tablet (88 mcg total) by mouth daily before breakfast. 07/29/19  Yes Volney American, PA-C  ?metoprolol succinate (TOPROL-XL) 100 MG 24 hr tablet Take 100 mg by mouth daily. 08/27/20  Yes [provider]  ?vitamin B-12 (CYANOCOBALAMIN) 1000 MCG tablet Take 1,000 mcg by mouth daily.   Yes [provider]  ?XARELTO 15 MG TABS tablet Take 15 mg by mouth daily. 07/08/20  Yes [provider]  ?acetaminophen (TYLENOL) 500 MG tablet Take 2 tablets (1,000 mg total) by mouth every 6 (six) hours. 10/19/21   Duanne Guess, PA-C  ?docusate sodium (COLACE) 100 MG capsule Take 1 capsule (100 mg total) by mouth 2 (two) times daily. 10/18/21   Duanne Guess, PA-C  ?donepezil (ARICEPT) 5 MG tablet Take 5 mg by mouth at bedtime. ?Patient not taking: Reported on 11/24/2021 08/28/20   [provider]  ?ferrous [id number] C-folic acid (TRINSICON  / FOLTRIN) capsule Take 1 capsule by mouth 2 (two) times daily after a meal. ?Patient not taking: Reported on 11/24/2021 10/18/21   Duanne Guess, PA-C  ?ferrous sulfate 325 (65 FE) MG EC tablet Take 325 mg by mouth 2 (two) times a week. ?Patient not taking: Reported on 11/24/2021    [provider]  ?HYDROcodone-acetaminophen (NORCO) 7.5-325 MG tablet Take 0.5-1 tablets by mouth every 4 (four) hours as needed for severe pain (pain score 7-10). ?Patient not taking: Reported on 11/24/2021 10/18/21   Duanne Guess, PA-C  ?methocarbamol (ROBAXIN) 500 MG tablet Take 1 tablet (500 mg total) by mouth every 6 (six) hours as needed for muscle spasms. 10/18/21   Duanne Guess,  PA-C  ?ondansetron (ZOFRAN) 4 MG tablet Take 1 tablet (4 mg total) by mouth every 6 (six) hours as needed for nausea. 10/18/21   Duanne Guess, PA-C  ?polyethylene glycol (MIRALAX / GLYCOLAX) 17 g packet Take 17 g by mouth daily as needed for mild constipation. 10/18/21   Duanne Guess, PA-C  ?valsartan-hydrochlorothiazide (DIOVAN-HCT) 160-25 MG tablet Take 1 tablet by mouth daily. 07/29/19   Volney American, PA-C  ? ? ?Current Facility-Administered Medications  ?Medication Dose Route Frequency Provider Last Rate Last Admin  ? 0.9 %  sodium chloride infusion   Intravenous PRN Sidney Ace, MD   Stopped at 11/26/21 1844  ? acetaminophen (TYLENOL) tablet 650 mg  650 mg Oral Q6H PRN Ivor Costa, MD   650 mg at 11/24/21 2156  ? albuterol (PROVENTIL) (2.5 MG/3ML) 0.083% nebulizer solution 2.5 mg  2.5 mg Inhalation Q4H PRN Ivor Costa, MD      ? azithromycin (ZITHROMAX) 500 mg in sodium chloride 0.9 % 250 mL IVPB  500 mg Intravenous Q24H Ralene Muskrat B, MD 250 mL/hr at 11/26/21 1853 Infusion Verify at 11/26/21 1853  ? cefTRIAXone (ROCEPHIN) 1 g in sodium chloride 0.9 % 100 mL IVPB  1 g Intravenous Q24H Sidney Ace, MD   Stopped at 11/26/21 1816  ? dextromethorphan-guaiFENesin (MUCINEX DM) 30-600 MG per 12 hr tablet 1 tablet   1 tablet Oral BID PRN Ivor Costa, MD      ? dextrose 5 % solution   Intravenous Continuous Nolberto Hanlon, MD 50 mL/hr at 11/27/21 1142 New Bag at 11/27/21 1142  ? diltiazem (CARDIZEM) 125 mg in dextrose 5

## 2021-11-27 NOTE — Progress Notes (Addendum)
At 0137, informed Neomia Glass NP that patient was yellow MEWS due to heart rate but after giving cardizem went back to 90's. ? ?At [place], informed by telemetry that patient has been sustaining heart rate of 120-145, paged Neomia Glass to inform her that no medicine is due to be given at the moment and patient is sustaining afib RVR ? ?At 0359, paged Neomia Glass that rapid response has been called as patient has been sustaining Heart rate at 145. ? ?At 0430 transferred patient to ICU. ?

## 2021-11-27 NOTE — Progress Notes (Addendum)
? ?      CROSS COVER NOTE ? ?NAME: MARLISS BUTTACAVOLI ?MRN: [id number] ?DOB : 12/23/1940 ? ? ? ?Date of Service ?  11/27/2021  ?HPI/Events of Note ?  Rapid response called on patient with known history of AFIB with RVR for HR sustaining 130s-140s in the setting of not taking PO meds. Patient is asymptomatic and hemodynamically stable on arrival to Rapid Response. After 5 mg IV Metoprolol x2 HR remained in 130s and SBP was 220. Manual BP taken and SBP 200. High suspicion BP is not accurate. ? ?81 yo F presented to [facility] ED with shortness of breath and altered mental status. Currently being treated for decompensated heart failure. PMH HTN, Hyperlipidemia, GERD, hypothyroidism, OSA, CAD, CKD-3, CHF, AFIB, dementia, Iron Deficiency Anemia.   ? ?On arrival to ICU HR 118-134; BP 151/90. ?  ?Interventions ?  Plan: ?Metoprolol '5mg'$  IV x2 during rapid response ?Transfer to Stepdown, no 2A beds available. ?Cardizem gtt if HR sustains above 130. ?   ?   ? ?Neomia Glass MHA, MSN, FNP-BC ?Nurse Practitioner ?[facility] ?[place] ?Pager [phone] ? ?

## 2021-11-27 NOTE — Plan of Care (Signed)
?  Problem: Clinical Measurements: ?Goal: Ability to maintain clinical measurements within normal limits will improve ?Outcome: Not Progressing ?  ?Problem: Clinical Measurements: ?Goal: Cardiovascular complication will be avoided ?Outcome: Not Progressing ?  ?Problem: Elimination: ?Goal: Will not experience complications related to bowel motility ?Outcome: Not Progressing ?  ?Problem: Nutrition: ?Goal: Adequate nutrition will be maintained ?Outcome: Not Progressing ?  ?

## 2021-11-27 NOTE — Progress Notes (Signed)
Patient has sustained Heart rate of >130, per order to administer cardizem, situation not escalated as PRN medicine has been administered and patient's Heart rate has been controlled ? ? ? 11/27/21 0109  ?Assess: MEWS Score  ?Temp 97.7 ?F (36.5 ?C)  ?BP (!) 149/98  ?Pulse Rate (!) 158  ?Resp 20  ?Level of Consciousness Alert  ?SpO2 94 %  ?O2 Device Room Air  ?Assess: MEWS Score  ?MEWS Temp 0  ?MEWS Systolic 0  ?MEWS Pulse 3  ?MEWS RR 0  ?MEWS LOC 0  ?MEWS Score 3  ?MEWS Score Color Yellow  ?Assess: if the MEWS score is Yellow or Red  ?Were vital signs taken at a resting state? Yes  ?Focused Assessment No change from prior assessment  ?Does the patient meet 2 or more of the SIRS criteria? No  ?MEWS guidelines implemented *See Row Information* No, vital signs rechecked  ?Treat  ?MEWS Interventions Administered prn meds/treatments  ?Pain Scale 0-10  ?Pain Score 0  ?Assess: SIRS CRITERIA  ?SIRS Temperature  0  ?SIRS Pulse 1  ?SIRS Respirations  0  ?SIRS WBC 0  ?SIRS Score Sum  1  ? ? ?

## 2021-11-28 DIAGNOSIS — I5033 Acute on chronic diastolic (congestive) heart failure: Secondary | ICD-10-CM | POA: Diagnosis not present

## 2021-11-28 LAB — COMPREHENSIVE METABOLIC PANEL
ALT: 811 U/L — ABNORMAL HIGH (ref 0–44)
AST: 759 U/L — ABNORMAL HIGH (ref 15–41)
Albumin: 3.4 g/dL — ABNORMAL LOW (ref 3.5–5.0)
Alkaline Phosphatase: 97 U/L (ref 38–126)
Anion gap: 10 (ref 5–15)
BUN: 44 mg/dL — ABNORMAL HIGH (ref 8–23)
CO2: 24 mmol/L (ref 22–32)
Calcium: 8.4 mg/dL — ABNORMAL LOW (ref 8.9–10.3)
Chloride: 111 mmol/L (ref 98–111)
Creatinine, Ser: 1.6 mg/dL — ABNORMAL HIGH (ref 0.44–1.00)
GFR, Estimated: 32 mL/min — ABNORMAL LOW (ref >60)
Glucose, Bld: 141 mg/dL — ABNORMAL HIGH (ref 70–99)
Potassium: 3.1 mmol/L — ABNORMAL LOW (ref 3.5–5.1)
Sodium: 145 mmol/L (ref 135–145)
Total Bilirubin: 1.2 mg/dL (ref 0.3–1.2)
Total Protein: 6.1 g/dL — ABNORMAL LOW (ref 6.5–8.1)

## 2021-11-28 LAB — CBC
HCT: 31.9 % — ABNORMAL LOW (ref 36.0–46.0)
Hemoglobin: 9.5 g/dL — ABNORMAL LOW (ref 12.0–15.0)
MCH: 29.3 pg (ref 26.0–34.0)
MCHC: 29.8 g/dL — ABNORMAL LOW (ref 30.0–36.0)
MCV: 98.5 fL (ref 80.0–100.0)
Platelets: 147 10*3/uL — ABNORMAL LOW (ref 150–400)
RBC: 3.24 MIL/uL — ABNORMAL LOW (ref 3.87–5.11)
RDW: 17.2 % — ABNORMAL HIGH (ref 11.5–15.5)
WBC: 8.6 10*3/uL (ref 4.0–10.5)
nRBC: 0.3 % — ABNORMAL HIGH (ref 0.0–0.2)

## 2021-11-28 LAB — HEPATITIS PANEL, ACUTE
HCV Ab: NONREACTIVE
Hep A IgM: NONREACTIVE
Hep B C IgM: NONREACTIVE
Hepatitis B Surface Ag: NONREACTIVE

## 2021-11-28 LAB — GLUCOSE, CAPILLARY: Glucose-Capillary: 134 mg/dL — ABNORMAL HIGH (ref 70–99)

## 2021-11-28 LAB — PROTIME-INR
INR: 2.3 — ABNORMAL HIGH (ref 0.8–1.2)
Prothrombin Time: 25.1 seconds — ABNORMAL HIGH (ref 11.4–15.2)

## 2021-11-28 MED ORDER — RIVAROXABAN 15 MG PO TABS
15.0000 mg | ORAL_TABLET | Freq: Every day | ORAL | Status: DC
  Administered 2021-11-28: 15 mg via ORAL
  Filled 2021-11-28: qty 1

## 2021-11-28 MED ORDER — CHLORHEXIDINE GLUCONATE CLOTH 2 % EX PADS
6.0000 | MEDICATED_PAD | Freq: Every day | CUTANEOUS | Status: DC
  Administered 2021-11-28 – 2021-11-29 (×2): 6 via TOPICAL

## 2021-11-28 MED ORDER — POTASSIUM CHLORIDE 10 MEQ/100ML IV SOLN
10.0000 meq | INTRAVENOUS | Status: AC
  Administered 2021-11-28 (×4): 10 meq via INTRAVENOUS
  Filled 2021-11-28 (×4): qty 100

## 2021-11-28 NOTE — Progress Notes (Signed)
GI Inpatient Follow-up Note ? ?Subjective: ? ?Patient seen and mentally somewhat better but appears slightly delirious. ? ?Scheduled Inpatient Medications:  ? Chlorhexidine Gluconate Cloth  6 each Topical Daily  ? erythromycin  1 application. Left Eye BID  ? latanoprost  1 drop Both Eyes QHS  ? levothyroxine  88 mcg Oral QAC breakfast  ? metoprolol tartrate  50 mg Oral BID  ? rivaroxaban  15 mg Oral Q supper  ? vitamin B-12  1,000 mcg Oral Daily  ? ? ?Continuous Inpatient Infusions: ?  ? sodium chloride Stopped (11/26/21 1844)  ? cefTRIAXone (ROCEPHIN)  IV Stopped (11/27/21 1848)  ? dextrose 50 mL/hr at 11/28/21 1200  ? diltiazem (CARDIZEM) infusion Stopped (11/28/21 1884)  ? ? ?PRN Inpatient Medications:  ?sodium chloride, acetaminophen, albuterol, dextromethorphan-guaiFENesin, diltiazem, hydrALAZINE, LORazepam, methocarbamol, ondansetron (ZOFRAN) IV ? ?Review of Systems: ? ?Unable to assess due to mental status ?  ?Physical Examination: ?BP 107/74   Pulse 89   Temp 98.8 ?F (37.1 ?C) (Oral)   Resp 19   Ht '5\' 6"'$  (1.676 m)   Wt 86.3 kg   SpO2 93%   BMI 30.71 kg/m?  ?Gen: NAD ?HEENT: PEERLA, EOMI, ?Neck: supple, no JVD or thyromegaly ?Chest: No respiratory distress ?Abd: soft, non-tender, non-distended, no HSM, guarding, ridigity, or rebound tenderness ?Ext: 1+ edema ?Skin: no rash or lesions noted ?Lymph: no LAD ? ?Data: ?Lab Results  ?Component Value Date  ? WBC 8.6 11/28/2021  ? HGB 9.5 (L) 11/28/2021  ? HCT 31.9 (L) 11/28/2021  ? MCV 98.5 11/28/2021  ? PLT 147 (L) 11/28/2021  ? ?Recent Labs  ?Lab 11/26/21 ?0907 11/27/21 ?1123 11/28/21 ?1660  ?HGB 11.2* 10.0* 9.5*  ? ?Lab Results  ?Component Value Date  ? NA 145 11/28/2021  ? K 3.1 (L) 11/28/2021  ? CL 111 11/28/2021  ? CO2 24 11/28/2021  ? BUN 44 (H) 11/28/2021  ? CREATININE 1.60 (H) 11/28/2021  ? GLU 99 07/02/2018  ? ?Lab Results  ?Component Value Date  ? ALT 811 (H) 11/28/2021  ? AST 759 (H) 11/28/2021  ? ALKPHOS 97 11/28/2021  ? BILITOT 1.2 11/28/2021   ? ?Recent Labs  ?Lab 11/28/21 ?6301  ?INR 2.3*  ? ?Assessment/Plan: ?Ms. Dingee is a 81 y.o. lady with multiple problems including recent hip fracture and repair, CKD, HFpEF, and moderate to severe mitral regurgitation here with heart failure exacerbation and sepsis who developed acute live injury due to ischemic hepatopathy from likely a brief instance of hypotension while on diltiazem drip. ? ?Recommendations: ? ?- continue supportive care ?- continue to trend LFT's, anticipate possible late bump in bilirubin ?- avoid hepatotoxic medications ?- rest of management per primary team ? ?We will sign-off, please call if any questions or concerns. ? ?Raylene Miyamoto MD, MPH ?[facility] ? ?  ?

## 2021-11-28 NOTE — Consult Note (Signed)
[facility] Cardiology ? ?CARDIOLOGY CONSULT NOTE  ?Patient ID: ?Brittney Ramirez ?MRN: [id number] ?DOB/AGE: Apr 24, 1941 81 y.o. ? ?Admit date: 11/24/2021 ?Referring Physician Ralene Muskrat ?Primary Physician Adin Hector, MD ?Primary Cardiologist Dr Nehemiah Massed ?Reason for Consultation HFpEF exacerbation ? ?HPI:  ?Brittney Ramirez is a 81 year old female with a history of HFpEF, moderate to severe MR, hypertension, hyperlipidemia, OSA not on CPAP, CKD 3, dementia who was admitted with altered mental status and shortness of breath. Cardiology consulted for assistance with AF.  ? ?Interval history:\ ?- Alert this morning and answering questions.  ?- Starting to take PO including medications.  ?- Denies shortness of breath.  ? ?Review of systems complete and found to be negative unless listed above  ? ? ? ?Past Medical History:  ?Diagnosis Date  ? Aortic atherosclerosis ([place])   ? Arthritis   ? Atrial fibrillation ([place])   ? a.) CHA2DS2-VASc = 6 (age x 2, sex, CHF, HTN, aortic plaque). b.) rate/rhythm maintained on oral metoprolol succinate; chronically anticoagulated using dose reduced rivaroxaban d/t CKD.  ? Basal cell carcinoma   ? CHF (congestive heart failure) ([place]) 10/26/2014  ? a.)  TTE 10/26/2014: EF 45%; mild AR/PR; moderate MR/TR. b.) TTE 12/03/2016 mild LV dysfunction with LVH; EF 40%; global HK; severe LA and moderate RA enlargement; mild AR/PR, moderate MR/TR. c.)  TTE 01/25/2020: EF 45%; global HK; moderate LAE and mild RA enlargement; mild RV enlargement; trivial PR, mild AR, moderate to severe MR, moderate TR.  ? CKD (chronic kidney disease), stage III ([place])   ? Coronary artery calcification seen on CT scan   ? Dementia ([place])   ? a.) on donepezil + apoaequorin  ? Diverticulosis   ? DOE (dyspnea on exertion)   ? Fatty infiltration of liver   ? Full dentures   ? GERD (gastroesophageal reflux disease)   ? Hyperlipidemia   ? Hypertension   ? Hypothyroidism   ? Jaw fracture ([place]) 02/1995  ? a.) BILATERAL subcondylar fractures   ? Long term current use of anticoagulant   ? a.) rivaroxaban; dose reduced d/t CKD  ? Miscarriage   ? OSA (obstructive sleep apnea)   ? a.) does not require nocturnal PAP therapy  ? Osteoporosis   ? Pulmonary nodules   ? Thoracic aortic aneurysm (TAA) ([place]) 12/14/2015  ? a.) CT 12/14/2015: measured 4.2 cm. b.) CT 11/28/2016: not appreciated by radiologist, however CT reviewed by vascular and 4.2-4.3 cm aneurysmal dilitation noted.  ? Thyroid cyst   ?  ?Past Surgical History:  ?Procedure Laterality Date  ? CATARACT EXTRACTION Right   ? CHOLECYSTECTOMY  2014  ? COLONOSCOPY    ? JOINT REPLACEMENT Bilateral 2003  ? knees  ? TOTAL HIP ARTHROPLASTY Right 10/17/2021  ? Procedure: TOTAL HIP ARTHROPLASTY ANTERIOR APPROACH;  Surgeon: Hessie Knows, MD;  Location: [facility] ORS;  Service: Orthopedics;  Laterality: Right;  ?  ?Medications Prior to Admission  ?Medication Sig Dispense Refill Last Dose  ? Apoaequorin (PREVAGEN) 10 MG CAPS Take 10 mg by mouth daily.   11/23/2021  ? atorvastatin (LIPITOR) 40 MG tablet Take 1 tablet (40 mg total) by mouth at bedtime. 90 tablet 0 11/23/2021  ? erythromycin ophthalmic ointment Place 1 application into the left eye in the morning and at bedtime.   11/23/2021 at 1900  ? latanoprost (XALATAN) 0.005 % ophthalmic solution Place 1 drop into both eyes at bedtime.   11/23/2021 at 1900  ? levothyroxine (SYNTHROID) 88 MCG tablet Take 1 tablet (88 mcg total)  by mouth daily before breakfast. 90 tablet 0 11/23/2021  ? metoprolol succinate (TOPROL-XL) 100 MG 24 hr tablet Take 100 mg by mouth daily.   11/23/2021 at 0900  ? vitamin B-12 (CYANOCOBALAMIN) 1000 MCG tablet Take 1,000 mcg by mouth daily.   11/23/2021 at 0900  ? XARELTO 15 MG TABS tablet Take 15 mg by mouth daily.   11/23/2021 at 1900  ? acetaminophen (TYLENOL) 500 MG tablet Take 2 tablets (1,000 mg total) by mouth every 6 (six) hours. 30 tablet 0 prn at unknown  ? docusate sodium (COLACE) 100 MG capsule Take 1 capsule (100 mg total) by mouth 2 (two) times  daily. 10 capsule 0 prn at unknown  ? donepezil (ARICEPT) 5 MG tablet Take 5 mg by mouth at bedtime. (Patient not taking: Reported on 11/24/2021)   Not Taking  ? ferrous [id number] C-folic acid (TRINSICON / FOLTRIN) capsule Take 1 capsule by mouth 2 (two) times daily after a meal. (Patient not taking: Reported on 11/24/2021) 20 capsule 0 Not Taking  ? ferrous sulfate 325 (65 FE) MG EC tablet Take 325 mg by mouth 2 (two) times a week. (Patient not taking: Reported on 11/24/2021)   Not Taking  ? HYDROcodone-acetaminophen (NORCO) 7.5-325 MG tablet Take 0.5-1 tablets by mouth every 4 (four) hours as needed for severe pain (pain score 7-10). (Patient not taking: Reported on 11/24/2021) 30 tablet 0 Not Taking  ? methocarbamol (ROBAXIN) 500 MG tablet Take 1 tablet (500 mg total) by mouth every 6 (six) hours as needed for muscle spasms. 30 tablet 0 prn at unknown  ? ondansetron (ZOFRAN) 4 MG tablet Take 1 tablet (4 mg total) by mouth every 6 (six) hours as needed for nausea. 20 tablet 0 prn at unknown  ? polyethylene glycol (MIRALAX / GLYCOLAX) 17 g packet Take 17 g by mouth daily as needed for mild constipation. 14 each 0 prn at unknown  ? valsartan-hydrochlorothiazide (DIOVAN-HCT) 160-25 MG tablet Take 1 tablet by mouth daily. 90 tablet 0   ? ?Social History  ? ?Socioeconomic History  ? Marital status: Married  ?  Spouse name: Charlotte Crumb  ? Number of children: Not on file  ? Years of education: Not on file  ? Highest education level: Some college, no degree  ?Occupational History  ? Occupation: retired  ?Tobacco Use  ? Smoking status: Former  ?  Types: Cigarettes  ?  Start date: 02/07/1955  ?  Quit date: 02/08/1955  ?  Years since quitting: 66.8  ? Smokeless tobacco: Never  ?Vaping Use  ? Vaping Use: Never used  ?Substance and Sexual Activity  ? Alcohol use: No  ?  Alcohol/week: 0.0 standard drinks  ? Drug use: No  ? Sexual activity: Not on file  ?Other Topics Concern  ? Not on file  ?Social History Narrative  ? Not on file   ? ?Social Determinants of Health  ? ?Financial Resource Strain: Not on file  ?Food Insecurity: Not on file  ?Transportation Needs: Not on file  ?Physical Activity: Not on file  ?Stress: Not on file  ?Social Connections: Not on file  ?Intimate Partner Violence: Not on file  ?  ?Family History  ?Problem Relation Age of Onset  ? Heart disease Mother   ? Hypertension Mother   ? Heart disease Father   ? Hypertension Father   ? Arthritis Sister   ? Cancer Sister   ? Diabetes Sister   ? Heart disease Sister   ? Breast cancer Sister 31  ?  Diabetes Brother   ? Heart disease Brother   ? Hypertension Brother   ?  ? ? ?Review of systems complete and found to be negative unless listed above  ? ? ? ? ?PHYSICAL EXAM ? ?General: Malnourished. Appears ill.  ?HEENT:  Normocephalic and atramatic ?Neck:  No JVD.  ?Lungs: Clear bilaterally to auscultation and percussion. ?Heart: Irregularly irregular. 2/6 systolic murmur ?Abdomen: Bowel sounds are positive, abdomen soft and non-tender  ?Msk:  Back normal, normal gait. Normal strength and tone for age. ?Extremities: No clubbing, cyanosis or edema.   ?Neuro: Unable to assess given mental status ?Psych:  Does not respond to questioning.  ? ?Labs: ?  ?Lab Results  ?Component Value Date  ? WBC 8.6 11/28/2021  ? HGB 9.5 (L) 11/28/2021  ? HCT 31.9 (L) 11/28/2021  ? MCV 98.5 11/28/2021  ? PLT 147 (L) 11/28/2021  ?  ?Recent Labs  ?Lab 11/28/21 ?0086  ?NA 145  ?K 3.1*  ?CL 111  ?CO2 24  ?BUN 44*  ?CREATININE 1.60*  ?CALCIUM 8.4*  ?PROT 6.1*  ?BILITOT 1.2  ?ALKPHOS 97  ?ALT 811*  ?AST 759*  ?GLUCOSE 141*  ? ? ?Lab Results  ?Component Value Date  ? CKTOTAL 59 09/18/2020  ?  ?Lab Results  ?Component Value Date  ? CHOL 154 06/17/2018  ? CHOL 149 12/10/2017  ? CHOL 156 06/11/2017  ? ?Lab Results  ?Component Value Date  ? HDL 50 06/17/2018  ? HDL 49 06/11/2017  ? HDL 55 06/10/2016  ? ?Lab Results  ?Component Value Date  ? [place] 76 06/17/2018  ? [place] 77 06/11/2017  ? [place] 76 06/10/2016  ? ?Lab  Results  ?Component Value Date  ? TRIG 139 06/17/2018  ? TRIG 133 12/10/2017  ? TRIG 149 06/11/2017  ? ?Lab Results  ?Component Value Date  ? CHOLHDL 3.1 06/17/2018  ? CHOLHDL 3.2 06/11/2017  ? CHOLHDL 2.9

## 2021-11-28 NOTE — Progress Notes (Signed)
PT Cancellation Note ? ?Patient Details ?Name: Brittney Ramirez ?MRN: [id number] ?DOB: 1941-06-28 ? ? ?Cancelled Treatment:    Reason Eval/Treat Not Completed: Other (comment) ?On arrival pt sleeping with son at bed side.  He reports that she is only just now starting to wake up and recognize family, etc. But states that her mentation is still very far from baseline. With repeated calling of her name she did wake, briefly, and was able to state her name and birthday (sans year) but could not stay engaged/keep eyes open despite cuing from PT and son.  Son/PT discussed further and decided that trying to force working with today would probably not be too fruitful; will try back tomorrow as appropriate.  ? ?Kreg Shropshire, DPT ?11/28/2021, 3:23 PM ?

## 2021-11-28 NOTE — Progress Notes (Signed)
?PROGRESS NOTE ? ? ? ?Brittney Ramirez  OMV:[id number] DOB: [birth year] DOA: 11/24/2021 ?PCP: Adin Hector, MD  ? ? ?Brief Narrative:  ?hypothyroidism, OSA not on CPAP, CAD, CKD-3, CHF, atrial fibrillation on Xarelto, dementia, iron deficiency anemia, who presents with shortness of breath and altered mental status. ?  ?Patient was recently hospitalized from 3/2 - 3/4 due to right hip fracture.  Patient is s/p of right hip surgery. She finished course of rehab.  Per her son and husband at the bedside, in the past 2 days, patient has shortness breath which has been progressively worsening.  No chest pain, fever or chills.  Has mild dry cough.  Patient has history of dementia, but most of the time patient recognizes family members, is oriented to time and place. Patient is more confused today. ?  ?Pulmonary embolism ruled out.  Presentation consistent with acute decompensated heart failure.  Started on IV diuretics.  Atrial fibrillation with uncontrolled rate ? Volume status improved however kidney function worsen and dehydration noted on 4/11 ? ? ?4/12 patient had rapid response earlier this morning with A-fib RVR heart rate in 130s to 140s.  Patient was transferred to stepdown started on Cardizem drip. ?This am pt confused, not following commands. Has not taken po intake yet. Not able to give some of her po meds . MRI this am with small remote lacunar infarct of rt thalamus. ?Tele afib rate 80's to 90's on  cardizem gtt. ? ? ?4/13 awake more alert. Recognizes husband, answers questions. Ate 50% of her breakfast. Took meds crushed.  ? ? ? ?Consultants:  ?Cardiology ? ? ? ?Procedures: CT , MRI ? ?Antimicrobials:  ?Rocephin, azithromycin ? ? ?Subjective: ?Awake, talkative. HR better controlled. On low dose cardizem gtt. ? ?Objective: ?Vitals:  ? 11/28/21 0900 11/28/21 0915 11/28/21 0922 11/28/21 0930  ?BP: 135/85 133/79 133/79 115/70  ?Pulse: (!) 46 (!) 106 89 96  ?Resp: '15 15  19  '$ ?Temp:      ?TempSrc:      ?SpO2: 94%  100%  99%  ?Weight:      ?Height:      ? ? ?Intake/Output Summary (Last 24 hours) at 11/28/2021 1145 ?Last data filed at 11/28/2021 0800 ?Gross per 24 hour  ?Intake 1452 ml  ?Output 750 ml  ?Net 702 ml  ? ?Filed Weights  ? 11/25/21 0500 11/26/21 0453 11/27/21 0500  ?Weight: 86.1 kg 84.6 kg 86.3 kg  ? ? ?Examination: ?Calm, NAD ?Cta no w/r ?Reg s1/s2 no gallop ?Soft benign +bs ?No edema ?Awake, alert , oriented to person and DOB. Not to place. ?Mood and affect appropriate in current setting  ? ? ? ?Data Reviewed: I have personally reviewed following labs and imaging studies ? ?CBC: ?Recent Labs  ?Lab 11/24/21 ?0900 11/25/21 ?0530 11/26/21 ?0907 11/27/21 ?1123 11/28/21 ?6283  ?WBC 5.1 7.3 11.2* 7.9 8.6  ?NEUTROABS  --   --  8.6*  --   --   ?HGB 10.1* 11.2* 11.2* 10.0* 9.5*  ?HCT 34.7* 37.7 37.9 33.1* 31.9*  ?MCV 100.6* 100.5* 100.3* 98.2 98.5  ?PLT 191 207 195 165 147*  ? ?Basic Metabolic Panel: ?Recent Labs  ?Lab 11/24/21 ?0900 11/24/21 ?1158 11/25/21 ?0530 11/26/21 ?0907 11/27/21 ?1123 11/28/21 ?6629  ?NA 142  --  144 147* 147* 145  ?K 3.1*  --  4.0 4.2 3.4* 3.1*  ?CL 109  --  110 109 111 111  ?CO2 25  --  20* 20* 22 24  ?GLUCOSE 93  --  126* 101* 124* 141*  ?BUN 18  --  19 30* 47* 44*  ?CREATININE 0.98  --  1.25* 1.63* 2.08* 1.60*  ?CALCIUM 8.9  --  9.2 9.3 8.6* 8.4*  ?MG  --  1.7 1.8 1.9  --   --   ? ?GFR: ?Estimated Creatinine Clearance: 30 mL/min (A) (by C-G formula based on SCr of 1.6 mg/dL (H)). ?Liver Function Tests: ?Recent Labs  ?Lab 11/24/21 ?0900 11/27/21 ?1123 11/28/21 ?3810  ?AST 21 1,407* 759*  ?ALT 10 1,107* 811*  ?ALKPHOS 87 102 97  ?BILITOT 1.5* 1.4* 1.2  ?PROT 6.3* 6.3* 6.1*  ?ALBUMIN 3.4* 3.4* 3.4*  ? ?No results for input(s): LIPASE, AMYLASE in the last 168 hours. ?Recent Labs  ?Lab 11/27/21 ?1558  ?AMMONIA 26  ? ?Coagulation Profile: ?Recent Labs  ?Lab 11/28/21 ?1751  ?INR 2.3*  ? ?Cardiac Enzymes: ?No results for input(s): CKTOTAL, CKMB, CKMBINDEX, TROPONINI in the last 168 hours. ?BNP (last 3  results) ?No results for input(s): PROBNP in the last 8760 hours. ?HbA1C: ?No results for input(s): HGBA1C in the last 72 hours. ?CBG: ?Recent Labs  ?Lab 11/25/21 ?0902 11/26/21 ?0258 11/27/21 ?0430 11/27/21 ?1646 11/28/21 ?5277  ?GLUCAP 116* 99 126* 131* 134*  ? ?Lipid Profile: ?No results for input(s): CHOL, HDL, LDLCALC, TRIG, CHOLHDL, LDLDIRECT in the last 72 hours. ?Thyroid Function Tests: ?No results for input(s): TSH, T4TOTAL, FREET4, T3FREE, THYROIDAB in the last 72 hours. ?Anemia Panel: ?No results for input(s): VITAMINB12, FOLATE, FERRITIN, TIBC, IRON, RETICCTPCT in the last 72 hours. ?Sepsis Labs: ?Recent Labs  ?Lab 11/24/21 ?1029 11/24/21 ?1610  ?PROCALCITON  --  <0.10  ?LATICACIDVEN 1.5 2.0*  ? ? ?Recent Results (from the past 240 hour(s))  ?Blood Culture (routine x 2)     Status: None (Preliminary result)  ? Collection Time: 11/24/21 10:29 AM  ? Specimen: BLOOD  ?Result Value Ref Range Status  ? Specimen Description BLOOD LEFT ANTECUBITAL  Final  ? Special Requests   Final  ?  BOTTLES DRAWN AEROBIC AND ANAEROBIC Blood Culture adequate volume  ? Culture   Final  ?  NO GROWTH 3 DAYS ?Performed at [facility], [address], [place], [place] [address] ?  ? Report Status PENDING  Incomplete  ?Blood Culture (routine x 2)     Status: None (Preliminary result)  ? Collection Time: 11/24/21 10:29 AM  ? Specimen: BLOOD  ?Result Value Ref Range Status  ? Specimen Description BLOOD RIGHT ANTECUBITAL  Final  ? Special Requests   Final  ?  BOTTLES DRAWN AEROBIC AND ANAEROBIC Blood Culture adequate volume  ? Culture   Final  ?  NO GROWTH 3 DAYS ?Performed at [facility], [address], [place], [place] [address] ?  ? Report Status PENDING  Incomplete  ?Resp Panel by RT-PCR (Flu A&B, Covid) Nasopharyngeal Swab     Status: None  ? Collection Time: 11/24/21 11:20 AM  ? Specimen: Nasopharyngeal Swab; Nasopharyngeal(NP) swabs in vial transport medium  ?Result Value Ref Range Status  ? SARS  Coronavirus 2 by RT PCR NEGATIVE NEGATIVE Final  ?  Comment: (NOTE) ?SARS-CoV-2 target nucleic acids are NOT DETECTED. ? ?The SARS-CoV-2 RNA is generally detectable in upper respiratory ?specimens during the acute phase of infection. The lowest ?concentration of SARS-CoV-2 viral copies this assay can detect is ?138 copies/mL. A negative result does not preclude SARS-Cov-2 ?infection and should not be used as the sole basis for treatment or ?other patient management decisions. A negative result may  occur with  ?improper specimen collection/handling, submission of specimen other ?than nasopharyngeal swab, presence of viral mutation(s) within the ?areas targeted by this assay, and inadequate number of viral ?copies(<138 copies/mL). A negative result must be combined with ?clinical observations, patient history, and epidemiological ?information. The expected result is Negative. ? ?Fact Sheet for Patients:  ?EntrepreneurPulse.com.au ? ?Fact Sheet for Healthcare Providers:  ?IncredibleEmployment.be ? ?This test is no t yet approved or cleared by the [place] FDA and  ?has been authorized for detection and/or diagnosis of SARS-CoV-2 by ?FDA under an Emergency Use Authorization (EUA). This EUA will remain  ?in effect (meaning this test can be used) for the duration of the ?COVID-19 declaration under Section 564(b)(1) of the Act, 21 ?U.S.C.section 360bbb-3(b)(1), unless the authorization is terminated  ?or revoked sooner.  ? ? ?  ? Influenza A by PCR NEGATIVE NEGATIVE Final  ? Influenza B by PCR NEGATIVE NEGATIVE Final  ?  Comment: (NOTE) ?The Xpert Xpress SARS-CoV-2/FLU/RSV plus assay is intended as an aid ?in the diagnosis of influenza from Nasopharyngeal swab specimens and ?should not be used as a sole basis for treatment. Nasal washings and ?aspirates are unacceptable for Xpert Xpress SARS-CoV-2/FLU/RSV ?testing. ? ?Fact Sheet for  Patients: ?EntrepreneurPulse.com.au ? ?Fact Sheet for Healthcare Providers: ?IncredibleEmployment.be ? ?This test is not yet approved or cleared by the [place] FDA and ?has been authorized for detection and/or diagnosis of

## 2021-11-28 NOTE — Progress Notes (Signed)
OT Cancellation Note ? ?Patient Details ?Name: Brittney Ramirez ?MRN: [id number] ?DOB: 02-16-1941 ? ? ?Cancelled Treatment:    Reason Eval/Treat Not Completed: Fatigue/lethargy limiting ability to participate. Order received and chart reviewed. Per conversation with PT, family requesting to hold this afternoon and initiate therapy services next date as appropriate.  ? ?Dessie Coma, M.S. OTR/L  ?11/28/21, 3:32 PM  ?ascom [id number] ? ?

## 2021-11-29 ENCOUNTER — Inpatient Hospital Stay: Payer: Medicare HMO

## 2021-11-29 DIAGNOSIS — I5033 Acute on chronic diastolic (congestive) heart failure: Secondary | ICD-10-CM | POA: Diagnosis not present

## 2021-11-29 LAB — CBC
HCT: 31.8 % — ABNORMAL LOW (ref 36.0–46.0)
Hemoglobin: 9.6 g/dL — ABNORMAL LOW (ref 12.0–15.0)
MCH: 29.4 pg (ref 26.0–34.0)
MCHC: 30.2 g/dL (ref 30.0–36.0)
MCV: 97.2 fL (ref 80.0–100.0)
Platelets: 126 10*3/uL — ABNORMAL LOW (ref 150–400)
RBC: 3.27 MIL/uL — ABNORMAL LOW (ref 3.87–5.11)
RDW: 16.9 % — ABNORMAL HIGH (ref 11.5–15.5)
WBC: 6.9 10*3/uL (ref 4.0–10.5)
nRBC: 0.3 % — ABNORMAL HIGH (ref 0.0–0.2)

## 2021-11-29 LAB — CULTURE, BLOOD (ROUTINE X 2)
Culture: NO GROWTH
Culture: NO GROWTH
Special Requests: ADEQUATE
Special Requests: ADEQUATE

## 2021-11-29 LAB — PROTIME-INR
INR: 1.8 — ABNORMAL HIGH (ref 0.8–1.2)
Prothrombin Time: 20.6 seconds — ABNORMAL HIGH (ref 11.4–15.2)

## 2021-11-29 LAB — EPSTEIN-BARR VIRUS (EBV) ANTIBODY PROFILE
EBV NA IgG: 266 U/mL — ABNORMAL HIGH (ref 0.0–17.9)
EBV VCA IgG: >600 U/mL — ABNORMAL HIGH (ref 0.0–17.9)
EBV VCA IgM: <36 U/mL (ref 0.0–35.9)

## 2021-11-29 LAB — GLUCOSE, CAPILLARY: Glucose-Capillary: 122 mg/dL — ABNORMAL HIGH (ref 70–99)

## 2021-11-29 LAB — COMPREHENSIVE METABOLIC PANEL
ALT: 575 U/L — ABNORMAL HIGH (ref 0–44)
AST: 353 U/L — ABNORMAL HIGH (ref 15–41)
Albumin: 3.3 g/dL — ABNORMAL LOW (ref 3.5–5.0)
Alkaline Phosphatase: 89 U/L (ref 38–126)
Anion gap: 9 (ref 5–15)
BUN: 30 mg/dL — ABNORMAL HIGH (ref 8–23)
CO2: 25 mmol/L (ref 22–32)
Calcium: 8.4 mg/dL — ABNORMAL LOW (ref 8.9–10.3)
Chloride: 107 mmol/L (ref 98–111)
Creatinine, Ser: 1.09 mg/dL — ABNORMAL HIGH (ref 0.44–1.00)
GFR, Estimated: 51 mL/min — ABNORMAL LOW (ref >60)
Glucose, Bld: 127 mg/dL — ABNORMAL HIGH (ref 70–99)
Potassium: 3 mmol/L — ABNORMAL LOW (ref 3.5–5.1)
Sodium: 141 mmol/L (ref 135–145)
Total Bilirubin: 1.4 mg/dL — ABNORMAL HIGH (ref 0.3–1.2)
Total Protein: 5.7 g/dL — ABNORMAL LOW (ref 6.5–8.1)

## 2021-11-29 LAB — CMV ANTIBODY, IGG (EIA): CMV Ab - IgG: >10 U/mL — ABNORMAL HIGH (ref 0.00–0.59)

## 2021-11-29 LAB — CMV IGM: CMV IgM: <30 AU/mL (ref 0.0–29.9)

## 2021-11-29 MED ORDER — METOPROLOL TARTRATE 5 MG/5ML IV SOLN
5.0000 mg | INTRAVENOUS | Status: DC | PRN
  Administered 2021-11-30 – 2021-12-05 (×3): 5 mg via INTRAVENOUS
  Filled 2021-11-29 (×3): qty 5

## 2021-11-29 MED ORDER — LACTATED RINGERS IV SOLN: INTRAVENOUS | Status: DC

## 2021-11-29 MED ORDER — ENOXAPARIN SODIUM 100 MG/ML IJ SOSY
1.0000 mg/kg | PREFILLED_SYRINGE | Freq: Two times a day (BID) | INTRAMUSCULAR | Status: DC
  Administered 2021-11-29 – 2021-12-03 (×8): 85 mg via SUBCUTANEOUS
  Filled 2021-11-29 (×8): qty 0.85

## 2021-11-29 MED ORDER — POTASSIUM CHLORIDE 10 MEQ/100ML IV SOLN
10.0000 meq | INTRAVENOUS | Status: AC
  Administered 2021-11-29 (×6): 10 meq via INTRAVENOUS
  Filled 2021-11-29 (×7): qty 100

## 2021-11-29 NOTE — Progress Notes (Addendum)
?PROGRESS NOTE ? ? ? ?Brittney Ramirez  ONG:[id number] DOB: 04-28-1941 DOA: 11/24/2021 ?PCP: Adin Hector, MD  ? ? ?Brief Narrative:  ?hypothyroidism, OSA not on CPAP, CAD, CKD-3, CHF, atrial fibrillation on Xarelto, dementia, iron deficiency anemia, who presents with shortness of breath and altered mental status. ?  ?Patient was recently hospitalized from 3/2 - 3/4 due to right hip fracture.  Patient is s/p of right hip surgery. She finished course of rehab.  Per her son and husband at the bedside, in the past 2 days, patient has shortness breath which has been progressively worsening.  No chest pain, fever or chills.  Has mild dry cough.  Patient has history of dementia, but most of the time patient recognizes family members, is oriented to time and place. Patient is more confused today. ?  ?Pulmonary embolism ruled out.  Presentation consistent with acute decompensated heart failure.  Started on IV diuretics.  Atrial fibrillation with uncontrolled rate ? Volume status improved however kidney function worsen and dehydration noted on 4/11 ? ? ?4/12 patient had rapid response earlier this morning with A-fib RVR heart rate in 130s to 140s.  Patient was transferred to stepdown started on Cardizem drip. ?This am pt confused, not following commands. Has not taken po intake yet. Not able to give some of her po meds . MRI this am with small remote lacunar infarct of rt thalamus. ?Tele afib rate 80's to 90's on  cardizem gtt. ? ? ?4/13 awake more alert. Recognizes husband, answers questions. Ate 50% of her breakfast. Took meds crushed.  ?4/14 confused this am . Husband at bedside. Refusing eating, or taking meds. ? ? ? ?Consultants:  ?Cardiology ? ? ? ?Procedures: CT , MRI ? ?Antimicrobials:  ?Rocephin, azithromycin ? ? ?Subjective: ?Per husband she did well last night, ate dinner.  ? ? ?Objective: ?Vitals:  ? 11/29/21 0900 11/29/21 1000 11/29/21 1100 11/29/21 1200  ?BP:  (!) 157/75 (!) 143/72 (!) 135/93  ?Pulse: 91 (!) 105  91 90  ?Resp: (!) 21 (!) 22 (!) 21 (!) 21  ?Temp:    99 ?F (37.2 ?C)  ?TempSrc:    Oral  ?SpO2: 100% 97% 97% 99%  ?Weight:      ?Height:      ? ? ?Intake/Output Summary (Last 24 hours) at 11/29/2021 1545 ?Last data filed at 11/29/2021 1000 ?Gross per 24 hour  ?Intake 1489.08 ml  ?Output 1325 ml  ?Net 164.08 ml  ? ?Filed Weights  ? 11/26/21 0453 11/27/21 0500 11/29/21 0600  ?Weight: 84.6 kg 86.3 kg 85.6 kg  ? ? ?Examination: ?Calm, NAD ?Poor respiratory effort, mildly gurgling upper airway, no wheezing or rhonchi ?Reg-irregs1/s2 no gallop ?Soft benign +bs ?No edema ?Confused, grossly intact ?Mood and affect appropriate in current setting  ? ? ? ?Data Reviewed: I have personally reviewed following labs and imaging studies ? ?CBC: ?Recent Labs  ?Lab 11/25/21 ?0530 11/26/21 ?0907 11/27/21 ?1123 11/28/21 ?4401 11/29/21 ?0272  ?WBC 7.3 11.2* 7.9 8.6 6.9  ?NEUTROABS  --  8.6*  --   --   --   ?HGB 11.2* 11.2* 10.0* 9.5* 9.6*  ?HCT 37.7 37.9 33.1* 31.9* 31.8*  ?MCV 100.5* 100.3* 98.2 98.5 97.2  ?PLT 207 195 165 147* 126*  ? ?Basic Metabolic Panel: ?Recent Labs  ?Lab 11/24/21 ?1158 11/25/21 ?0530 11/26/21 ?0907 11/27/21 ?1123 11/28/21 ?5366 11/29/21 ?4403  ?NA  --  144 147* 147* 145 141  ?K  --  4.0 4.2 3.4* 3.1* 3.0*  ?  CL  --  110 109 111 111 107  ?CO2  --  20* 20* '22 24 25  '$ ?GLUCOSE  --  126* 101* 124* 141* 127*  ?BUN  --  19 30* 47* 44* 30*  ?CREATININE  --  1.25* 1.63* 2.08* 1.60* 1.09*  ?CALCIUM  --  9.2 9.3 8.6* 8.4* 8.4*  ?MG 1.7 1.8 1.9  --   --   --   ? ?GFR: ?Estimated Creatinine Clearance: 43.8 mL/min (A) (by C-G formula based on SCr of 1.09 mg/dL (H)). ?Liver Function Tests: ?Recent Labs  ?Lab 11/24/21 ?0900 11/27/21 ?1123 11/28/21 ?4403 11/29/21 ?4742  ?AST 21 1,407* 759* 353*  ?ALT 10 1,107* 811* 575*  ?ALKPHOS 87 102 97 89  ?BILITOT 1.5* 1.4* 1.2 1.4*  ?PROT 6.3* 6.3* 6.1* 5.7*  ?ALBUMIN 3.4* 3.4* 3.4* 3.3*  ? ?No results for input(s): LIPASE, AMYLASE in the last 168 hours. ?Recent Labs  ?Lab 11/27/21 ?1558   ?AMMONIA 26  ? ?Coagulation Profile: ?Recent Labs  ?Lab 11/28/21 ?5956  ?INR 2.3*  ? ?Cardiac Enzymes: ?No results for input(s): CKTOTAL, CKMB, CKMBINDEX, TROPONINI in the last 168 hours. ?BNP (last 3 results) ?No results for input(s): PROBNP in the last 8760 hours. ?HbA1C: ?No results for input(s): HGBA1C in the last 72 hours. ?CBG: ?Recent Labs  ?Lab 11/26/21 ?3875 11/27/21 ?0430 11/27/21 ?1646 11/28/21 ?6433 11/29/21 ?2951  ?GLUCAP 99 126* 131* 134* 122*  ? ?Lipid Profile: ?No results for input(s): CHOL, HDL, LDLCALC, TRIG, CHOLHDL, LDLDIRECT in the last 72 hours. ?Thyroid Function Tests: ?No results for input(s): TSH, T4TOTAL, FREET4, T3FREE, THYROIDAB in the last 72 hours. ?Anemia Panel: ?No results for input(s): VITAMINB12, FOLATE, FERRITIN, TIBC, IRON, RETICCTPCT in the last 72 hours. ?Sepsis Labs: ?Recent Labs  ?Lab 11/24/21 ?1029 11/24/21 ?1610  ?PROCALCITON  --  <0.10  ?LATICACIDVEN 1.5 2.0*  ? ? ?Recent Results (from the past 240 hour(s))  ?Blood Culture (routine x 2)     Status: None  ? Collection Time: 11/24/21 10:29 AM  ? Specimen: BLOOD  ?Result Value Ref Range Status  ? Specimen Description BLOOD LEFT ANTECUBITAL  Final  ? Special Requests   Final  ?  BOTTLES DRAWN AEROBIC AND ANAEROBIC Blood Culture adequate volume  ? Culture   Final  ?  NO GROWTH 5 DAYS ?Performed at [facility], [address], [place], [place] [address] ?  ? Report Status 11/29/2021 FINAL  Final  ?Blood Culture (routine x 2)     Status: None  ? Collection Time: 11/24/21 10:29 AM  ? Specimen: BLOOD  ?Result Value Ref Range Status  ? Specimen Description BLOOD RIGHT ANTECUBITAL  Final  ? Special Requests   Final  ?  BOTTLES DRAWN AEROBIC AND ANAEROBIC Blood Culture adequate volume  ? Culture   Final  ?  NO GROWTH 5 DAYS ?Performed at [facility], [address], [place], [place] [address] ?  ? Report Status 11/29/2021 FINAL  Final  ?Resp Panel by RT-PCR (Flu A&B, Covid) Nasopharyngeal Swab     Status: None  ?  Collection Time: 11/24/21 11:20 AM  ? Specimen: Nasopharyngeal Swab; Nasopharyngeal(NP) swabs in vial transport medium  ?Result Value Ref Range Status  ? SARS Coronavirus 2 by RT PCR NEGATIVE NEGATIVE Final  ?  Comment: (NOTE) ?SARS-CoV-2 target nucleic acids are NOT DETECTED. ? ?The SARS-CoV-2 RNA is generally detectable in upper respiratory ?specimens during the acute phase of infection. The lowest ?concentration of SARS-CoV-2 viral copies this assay can detect  is ?138 copies/mL. A negative result does not preclude SARS-Cov-2 ?infection and should not be used as the sole basis for treatment or ?other patient management decisions. A negative result may occur with  ?improper specimen collection/handling, submission of specimen other ?than nasopharyngeal swab, presence of viral mutation(s) within the ?areas targeted by this assay, and inadequate number of viral ?copies(<138 copies/mL). A negative result must be combined with ?clinical observations, patient history, and epidemiological ?information. The expected result is Negative. ? ?Fact Sheet for Patients:  ?EntrepreneurPulse.com.au ? ?Fact Sheet for Healthcare Providers:  ?IncredibleEmployment.be ? ?This test is no t yet approved or cleared by the [place] FDA and  ?has been authorized for detection and/or diagnosis of SARS-CoV-2 by ?FDA under an Emergency Use Authorization (EUA). This EUA will remain  ?in effect (meaning this test can be used) for the duration of the ?COVID-19 declaration under Section 564(b)(1) of the Act, 21 ?U.S.C.section 360bbb-3(b)(1), unless the authorization is terminated  ?or revoked sooner.  ? ? ?  ? Influenza A by PCR NEGATIVE NEGATIVE Final  ? Influenza B by PCR NEGATIVE NEGATIVE Final  ?  Comment: (NOTE) ?The Xpert Xpress SARS-CoV-2/FLU/RSV plus assay is intended as an aid ?in the diagnosis of influenza from Nasopharyngeal swab specimens and ?should not be used as a sole basis for treatment.  Nasal washings and ?aspirates are unacceptable for Xpert Xpress SARS-CoV-2/FLU/RSV ?testing. ? ?Fact Sheet for Patients: ?EntrepreneurPulse.com.au ? ?Fact Sheet for Healthcare Providers: ?h

## 2021-11-29 NOTE — Progress Notes (Signed)
ANTICOAGULATION CONSULT NOTE - Initial Consult ? ?Pharmacy Consult for Enoxaparin ?Indication: atrial fibrillation ? ?Allergies  ?Allergen Reactions  ? Morphine And Related Nausea And Vomiting  ?  Diarrhea  ? Buprenorphine Hcl Nausea And Vomiting  ?  Diarrhea  ? Codeine Nausea Only  ?  GI upset ?  ? Desipramine Nausea Only  ? Fosamax [Alendronate] Rash  ? ? ?Patient Measurements: ?Height: '5\' 6"'$  (167.6 cm) ?Weight: 85.6 kg (188 lb 11.4 oz) ?IBW/kg (Calculated) : 59.3 ?Heparin Dosing Weight:   ? ?Vital Signs: ?Temp: 99 ?F (37.2 ?C) (04/14 1200) ?Temp Source: Oral (04/14 1200) ?BP: 135/93 (04/14 1200) ?Pulse Rate: 90 (04/14 1200) ? ?Labs: ?Recent Labs  ?  11/27/21 ?1123 11/28/21 ?0867 11/29/21 ?6195  ?HGB 10.0* 9.5* 9.6*  ?HCT 33.1* 31.9* 31.8*  ?PLT 165 147* 126*  ?LABPROT  --  25.1*  --   ?INR  --  2.3*  --   ?CREATININE 2.08* 1.60* 1.09*  ? ? ?Estimated Creatinine Clearance: 43.8 mL/min (A) (by C-G formula based on SCr of 1.09 mg/dL (H)). ? ? ?Medical History: ?Past Medical History:  ?Diagnosis Date  ? Aortic atherosclerosis ([place])   ? Arthritis   ? Atrial fibrillation ([place])   ? a.) CHA2DS2-VASc = 6 (age x 2, sex, CHF, HTN, aortic plaque). b.) rate/rhythm maintained on oral metoprolol succinate; chronically anticoagulated using dose reduced rivaroxaban d/t CKD.  ? Basal cell carcinoma   ? CHF (congestive heart failure) ([place]) 10/26/2014  ? a.)  TTE 10/26/2014: EF 45%; mild AR/PR; moderate MR/TR. b.) TTE 12/03/2016 mild LV dysfunction with LVH; EF 40%; global HK; severe LA and moderate RA enlargement; mild AR/PR, moderate MR/TR. c.)  TTE 01/25/2020: EF 45%; global HK; moderate LAE and mild RA enlargement; mild RV enlargement; trivial PR, mild AR, moderate to severe MR, moderate TR.  ? CKD (chronic kidney disease), stage III ([place])   ? Coronary artery calcification seen on CT scan   ? Dementia ([place])   ? a.) on donepezil + apoaequorin  ? Diverticulosis   ? DOE (dyspnea on exertion)   ? Fatty infiltration of liver   ? Full  dentures   ? GERD (gastroesophageal reflux disease)   ? Hyperlipidemia   ? Hypertension   ? Hypothyroidism   ? Jaw fracture ([place]) 02/1995  ? a.) BILATERAL subcondylar fractures  ? Long term current use of anticoagulant   ? a.) rivaroxaban; dose reduced d/t CKD  ? Miscarriage   ? OSA (obstructive sleep apnea)   ? a.) does not require nocturnal PAP therapy  ? Osteoporosis   ? Pulmonary nodules   ? Thoracic aortic aneurysm (TAA) ([place]) 12/14/2015  ? a.) CT 12/14/2015: measured 4.2 cm. b.) CT 11/28/2016: not appreciated by radiologist, however CT reviewed by vascular and 4.2-4.3 cm aneurysmal dilitation noted.  ? Thyroid cyst   ? ?Assessment: ?Patient is a 81 yo female with a history of afib on Xarelto. Patient is confused and is refusing oral medications at this time. Pharmacy consult to transition patient to Enoxaparin. ? ?Goal of Therapy:  ?Monitor platelets by anticoagulation protocol: Yes ?  ?Plan:  ?Lovenox '1mg'$ /kg SQ q12h. Monitor CBC and SCR per protocol. ? ?Paulina Fusi, PharmD, BCPS ?11/29/2021 ?4:05 PM ? ? ? ?

## 2021-11-29 NOTE — Evaluation (Signed)
Occupational Therapy Evaluation Patient Details Name: Brittney Ramirez MRN: [id number] DOB: 13-Dec-1940 Today's Date: 11/29/2021   History of Present Illness Pt is an 81 y/o F who was admitted after presenting with SOB & AMS. Pt admitted for tx of acute on chronic diastolic CHF. Pt had rapid response called on 4/12 2/2 a-fib with RVR HR & pt was transferred to ICU & placed on cardizem drip. MRI showed small remote lacunar infarct of R thalamus. Pt was recently hospitalized from 3/2-3/4 for R hip fx. PMH: hypothyroidism, OSA not on CPAP, CAD, CKD 3, CHF, a-fib on xarelto, dementia, iron deficiency anemia   Clinical Impression   [name] was seen for OT evaluation this date. Prior to hospital admission, pt was MOD I for mobility using RW. Pt lives with spouse in home c 4 STE. Pt presents to acute OT demonstrating impaired ADL performance and functional mobility 2/2 decreased activity tolerance, decreased cognition, and functional strength/ROM/balance deficits. Pt currently requires MAX A x2 pericare at bed level. MOD A hand over hand face washing at bed level - asssit for sequencing task as pt does not follow commands but demonstrates strength/ROM to perform. MAX A grooming seated EOB - requires BUE support static sitting. Pt would benefit from skilled OT to address noted impairments and functional limitations (see below for any additional details). Upon hospital discharge, recommend STR to maximize pt safety and return to PLOF.    Recommendations for follow up therapy are one component of a multi-disciplinary discharge planning process, led by the attending physician.  Recommendations may be updated based on patient status, additional functional criteria and insurance authorization.   Follow Up Recommendations  Skilled nursing-short term rehab (<3 hours/day)    Assistance Recommended at Discharge Frequent or constant Supervision/Assistance  Patient can return home with the following Two people to help  with walking and/or transfers;Two people to help with bathing/dressing/bathroom;Help with stairs or ramp for entrance    Functional Status Assessment  Patient has had a recent decline in their functional status and demonstrates the ability to make significant improvements in function in a reasonable and predictable amount of time.  Equipment Recommendations  Hospital bed    Recommendations for Other Services       Precautions / Restrictions Precautions Precautions: Fall Restrictions Weight Bearing Restrictions: Yes RLE Weight Bearing: Weight bearing as tolerated (anterior hip sx on 10/17/21)      Mobility Bed Mobility Overal bed mobility: Needs Assistance Bed Mobility: Supine to Sit, Sit to Supine, Rolling Rolling: Max assist, +2 for physical assistance   Supine to sit: Max assist, +2 for physical assistance, +2 for safety/equipment, HOB elevated Sit to supine: Max assist, HOB elevated, +2 for safety/equipment, +2 for physical assistance        Transfers                   General transfer comment: unsafe to attempt      Balance Overall balance assessment: Needs assistance Sitting-balance support: Feet supported, Bilateral upper extremity supported Sitting balance-Leahy Scale: Poor Sitting balance - Comments: intermittently CGA however primarily requires MIN-MOD A                                   ADL either performed or assessed with clinical judgement   ADL Overall ADL's : Needs assistance/impaired  General ADL Comments: MAX A x2 pericare at bed level. MOD A hand over hand face washing at bed level - asssit for sequencing task as pt does not follow commands but demonstrates strength/ROM to perform. MAX A grooming seated EOB - requires BUE support static sitting      Pertinent Vitals/Pain Pain Assessment Pain Assessment: Faces Faces Pain Scale: Hurts even more Pain Location: R hip Pain  Descriptors / Indicators: Grimacing Pain Intervention(s): Repositioned     Hand Dominance Right   Extremity/Trunk Assessment Upper Extremity Assessment Upper Extremity Assessment: Generalized weakness;Difficult to assess due to impaired cognition   Lower Extremity Assessment Lower Extremity Assessment: Defer to PT evaluation    Communication Communication Communication:  (pt only expresses "I don't know" and "yeah" during session)   Cognition Arousal/Alertness: Awake/alert Behavior During Therapy: Restless Overall Cognitive Status: Impaired/Different from baseline Area of Impairment: Orientation, Attention, Following commands, Awareness, Safety/judgement, Memory, Problem solving                 Orientation Level: Disoriented to, Place, Time, Situation, Person   Memory: Decreased recall of precautions, Decreased short-term memory Following Commands: Follows one step commands inconsistently, Follows one step commands with increased time Safety/Judgement: Decreased awareness of safety, Decreased awareness of deficits Awareness: Intellectual Problem Solving: Decreased initiation, Slow processing, Difficulty sequencing, Requires verbal cues, Requires tactile cues       General Comments  max HR seen 142     Home Living Family/patient expects to be discharged to:: Private residence Living Arrangements: Spouse/significant other Available Help at Discharge: Family;Available 24 hours/day Type of Home: House Home Access: Stairs to enter [employer] of Steps: 3-4 Entrance Stairs-Rails: Right (chart states B rails, pt's husband reports R rail on day of evaluation) Home Layout: One level         Bathroom Toilet: Handicapped height     Home Equipment: Agricultural consultant (2 wheels);Cane - single point;BSC/3in1;Shower seat;Grab bars - tub/shower          Prior Functioning/Environment               Mobility Comments: Pt had recent THA (anterior approach) on  10/17/21 & was almost finished with HHPT f/u, PRN use of RW. ADLs Comments: spouse assist for IADLs d/t cognition per chart review        OT Problem List: Decreased strength;Decreased range of motion;Decreased activity tolerance;Impaired balance (sitting and/or standing);Decreased safety awareness;Decreased knowledge of use of DME or AE;Decreased cognition      OT Treatment/Interventions: Self-care/ADL training;Therapeutic exercise;DME and/or AE instruction;Energy conservation;Therapeutic activities;Patient/family education    OT Goals(Current goals can be found in the care plan section) Acute Rehab OT Goals Patient Stated Goal: to feel better OT Goal Formulation: With family Time For Goal Achievement: Dec 16, 2021 Potential to Achieve Goals: Fair ADL Goals Pt Will Perform Eating: with set-up;with supervision;sitting Pt Will Perform Grooming: with modified independence;bed level (c no cues for sequencing) Pt Will Transfer to Toilet: with mod assist;squat pivot transfer;bedside commode  OT Frequency: Min 2X/week    Co-evaluation PT/OT/SLP Co-Evaluation/Treatment: Yes Reason for Co-Treatment: Necessary to address cognition/behavior during functional activity;For patient/therapist safety;To address functional/ADL transfers PT goals addressed during session: Mobility/safety with mobility OT goals addressed during session: ADL's and self-care      AM-PAC OT "6 Clicks" Daily Activity     Outcome Measure Help from another person eating meals?: A Little Help from another person taking care of personal grooming?: A Lot Help from another person toileting, which includes using toliet, bedpan, or  urinal?: A Lot Help from another person bathing (including washing, rinsing, drying)?: A Lot Help from another person to put on and taking off regular upper body clothing?: A Lot Help from another person to put on and taking off regular lower body clothing?: A Lot 6 Click Score: 13   End of Session  Nurse Communication: Mobility status  Activity Tolerance: Patient limited by fatigue Patient left: in bed;with call bell/phone within reach;with bed alarm set;with nursing/sitter in room;with family/visitor present  OT Visit Diagnosis: Other abnormalities of gait and mobility (R26.89);Muscle weakness (generalized) (M62.81)                Time: 2956-2130 OT Time Calculation (min): 31 min Charges:  OT General Charges $OT Visit: 1 Visit OT Evaluation $OT Eval Moderate Complexity: 1 Mod OT Treatments $Self Care/Home Management : 8-22 mins  Kathie Dike, M.S. OTR/L  11/29/21, 2:16 PM  ascom [phone]

## 2021-11-29 NOTE — Progress Notes (Signed)
Eeg done 

## 2021-11-29 NOTE — Progress Notes (Signed)
Stopped Cardizem drip per Dr. Jessy Oto note and from clinical presentation of patient, heart rate is under 100, notified NP Sharion Settler of stopping cardizem. Also stopped LR drip due to clinical presentation. And chest xray results. Again notified NP Sharion Settler of this. She agreed with both changes.  ?

## 2021-11-29 NOTE — Evaluation (Signed)
Physical Therapy Evaluation ?Patient Details ?Name: Brittney Ramirez ?MRN: [id number] ?DOB: [birth year] ?Today's Date: 11/29/2021 ? ?History of Present Illness ? Pt is an 81 y/o F who was admitted after presenting with SOB & AMS. Pt admitted for tx of acute on chronic diastolic CHF. Pt had rapid response called on 4/12 2/2 a-fib with RVR HR & pt was transferred to ICU & placed on cardizem drip. MRI showed small remote lacunar infarct of R thalamus. Pt was recently hospitalized from 3/2-3/4 for R hip fx. PMH: hypothyroidism, OSA not on CPAP, CAD, CKD 3, CHF, a-fib on xarelto, dementia, iron deficiency anemia  ?Clinical Impression ? Pt seen for PT evaluation with co-tx with OT for pt & therapists' safety. Pt's husband is present & reports pt was almost finished receiving HHPT f/u for R THA (sx 10/17/21) & was using RW. On this date, pt presents with minimal verbal communication, poor ability to follow commands & requires max assist +2 for supine<>sit & to sit EOB. Pt tolerates sitting EOB ~5 minutes but doesn't attempt to engage in functional task such as washing her face/holding washcloth. Pt requires dependent assist for peri hygiene once returned to supine. Pt would benefit from STR upon d/c to maximize independence with functional mobility, decrease caregiver burden, & reduce fall risk prior to return home. ?  ? ?Recommendations for follow up therapy are one component of a multi-disciplinary discharge planning process, led by the attending physician.  Recommendations may be updated based on patient status, additional functional criteria and insurance authorization. ? ?Follow Up Recommendations Skilled nursing-short term rehab (<3 hours/day) ? ?  ?Assistance Recommended at Discharge Frequent or constant Supervision/Assistance  ?Patient can return home with the following ? Two people to help with walking and/or transfers;Two people to help with bathing/dressing/bathroom;Direct supervision/assist for medications  management;Help with stairs or ramp for entrance;Assistance with feeding;Assist for transportation;Assistance with cooking/housework;Direct supervision/assist for financial management ? ?  ?Equipment Recommendations  (TBD in next venue)  ?Recommendations for Other Services ?    ?  ?Functional Status Assessment Patient has had a recent decline in their functional status and demonstrates the ability to make significant improvements in function in a reasonable and predictable amount of time.  ? ?  ?Precautions / Restrictions Precautions ?Precautions: Fall ?Restrictions ?Weight Bearing Restrictions: Yes ?RLE Weight Bearing: Weight bearing as tolerated (anterior hip sx on 10/17/21)  ? ?  ? ?Mobility ? Bed Mobility ?Overal bed mobility: Needs Assistance ?Bed Mobility: Supine to Sit, Sit to Supine, Rolling ?Rolling: Max assist, +2 for physical assistance ?  ?Supine to sit: Max assist, +2 for physical assistance, +2 for safety/equipment, HOB elevated ?Sit to supine: Max assist, HOB elevated, +2 for safety/equipment, +2 for physical assistance ?  ?  ?  ? ?Transfers ?  ?  ?  ?  ?  ?  ?  ?  ?  ?  ?  ? ?Ambulation/Gait ?  ?  ?  ?  ?  ?  ?  ?  ? ?Stairs ?  ?  ?  ?  ?  ? ?Wheelchair Mobility ?  ? ?Modified Rankin (Stroke Patients Only) ?  ? ?  ? ?Balance Overall balance assessment: Needs assistance ?Sitting-balance support: Feet supported, Bilateral upper extremity supported ?Sitting balance-Leahy Scale: Zero ?Sitting balance - Comments: pt with brief episodes of attempting to hold self upright while sitting EOB but otherwise requries assistance 2/2 posterior lean/L lateral lean ?  ?  ?  ?  ?  ?  ?  ?  ?  ?  ?  ?  ?  ?  ?  ?   ? ? ? ?  Pertinent Vitals/Pain Pain Assessment ?Pain Assessment: Faces ?Faces Pain Scale: Hurts even more ?Pain Location: R hip ?Pain Descriptors / Indicators: Grimacing ?Pain Intervention(s): Repositioned, Monitored during session  ? ? ?Home Living Family/patient expects to be discharged to:: Private  residence ?Living Arrangements: Spouse/significant other ?Available Help at Discharge: Family;Available 24 hours/day ?Type of Home: House ?Home Access: Stairs to enter ?Entrance Stairs-Rails: Right (chart states B rails, pt's husband reports R rail on day of evaluation) ?Entrance Stairs-Number of Steps: 3-4 ?  ?Home Layout: One level ?Home Equipment: Conservation officer, nature (2 wheels);Cane - single point;Wheelchair - manual ?   ?  ?Prior Function   ?  ?  ?  ?  ?  ?  ?Mobility Comments: Pt had recent THA (anterior approach) on 10/17/21 & was almost finished with HHPT f/u, PRN use of RW. ?  ?  ? ? ?Hand Dominance  ?   ? ?  ?Extremity/Trunk Assessment  ? Upper Extremity Assessment ?Upper Extremity Assessment: Generalized weakness;Difficult to assess due to impaired cognition ?  ? ?Lower Extremity Assessment ?Lower Extremity Assessment: Generalized weakness;Difficult to assess due to impaired cognition;RLE deficits/detail;LLE deficits/detail ?RLE Deficits / Details: R anterior hip incision healed; pt appears to be in pain in R hip with PT facilitates midline sitting as pt otherwise leans to L hip; pt resistive to knee flexion while supine but able to flex knees while sitting EOB, PT attempts to extend knees with pt sitting EOB but pt grimacing during movement ?LLE Deficits / Details: R anterior hip incision healed; pt appears to be in pain in R hip with PT facilitates midline sitting as pt otherwise leans to L hip; pt resistive to knee flexion while supine but able to flex knees while sitting EOB, PT attempts to extend knees with pt sitting EOB but pt grimacing during movement ?  ? ?   ?Communication  ? Communication:  (pt only expresses "I don't know" and "yeah" during session, otherwise pt with just grunts/groans)  ?Cognition Arousal/Alertness: Awake/alert ?Behavior During Therapy: Restless ?Overall Cognitive Status: Impaired/Different from baseline ?Area of Impairment: Orientation, Attention, Following commands, Awareness,  Safety/judgement, Memory, Problem solving ?  ?  ?  ?  ?  ?  ?  ?  ?Orientation Level: Disoriented to, Place, Time, Situation, Person ?  ?Memory: Decreased recall of precautions, Decreased short-term memory ?Following Commands: Follows one step commands inconsistently, Follows one step commands with increased time ?Safety/Judgement: Decreased awareness of safety, Decreased awareness of deficits ?Awareness: Intellectual ?Problem Solving: Decreased initiation, Slow processing, Difficulty sequencing, Requires verbal cues, Requires tactile cues ?  ?  ?  ? ?  ?General Comments General comments (skin integrity, edema, etc.): max HR of 142 bpm during sitting EOB; attempted to engage pt in reaching for/holding washcloth to wash face but pt with no initiation ? ?  ?Exercises    ? ?Assessment/Plan  ?  ?PT Assessment Patient needs continued PT services  ?PT Problem List Decreased strength;Decreased mobility;Decreased safety awareness;Decreased range of motion;Decreased knowledge of precautions;Decreased coordination;Decreased activity tolerance;Decreased cognition;Decreased balance;Decreased knowledge of use of DME;Pain;Cardiopulmonary status limiting activity;Decreased skin integrity ? ?   ?  ?PT Treatment Interventions DME instruction;Cognitive remediation;Modalities;Therapeutic activities;Gait training;Patient/family education;Therapeutic exercise;Stair training;Balance training;Neuromuscular re-education;Functional mobility training;Manual techniques   ? ?PT Goals (Current goals can be found in the Care Plan section)  ?Acute Rehab PT Goals ?Patient Stated Goal: get better ?PT Goal Formulation: With family ?Time For Goal Achievement: Dec 19, 2021 ?Potential to Achieve Goals: Fair ? ?  ?Frequency Min 2X/week ?  ? ? ?  Co-evaluation PT/OT/SLP Co-Evaluation/Treatment: Yes ?Reason for Co-Treatment: For patient/therapist safety;Complexity of the patient's impairments (multi-system involvement);Necessary to address cognition/behavior  during functional activity ?PT goals addressed during session: Mobility/safety with mobility;Balance ?  ?  ? ? ?  ?AM-PAC PT "6 Clicks" Mobility  ?Outcome Measure Help needed turning from your back to your side while in a flat b

## 2021-11-30 DIAGNOSIS — G9341 Metabolic encephalopathy: Secondary | ICD-10-CM | POA: Diagnosis not present

## 2021-11-30 DIAGNOSIS — F03B0 Unspecified dementia, moderate, without behavioral disturbance, psychotic disturbance, mood disturbance, and anxiety: Secondary | ICD-10-CM

## 2021-11-30 DIAGNOSIS — I5033 Acute on chronic diastolic (congestive) heart failure: Secondary | ICD-10-CM | POA: Diagnosis not present

## 2021-11-30 LAB — HEPATIC FUNCTION PANEL
ALT: 494 U/L — ABNORMAL HIGH (ref 0–44)
AST: 216 U/L — ABNORMAL HIGH (ref 15–41)
Albumin: 3.4 g/dL — ABNORMAL LOW (ref 3.5–5.0)
Alkaline Phosphatase: 95 U/L (ref 38–126)
Bilirubin, Direct: 0.5 mg/dL — ABNORMAL HIGH (ref 0.0–0.2)
Indirect Bilirubin: 1.1 mg/dL — ABNORMAL HIGH (ref 0.3–0.9)
Total Bilirubin: 1.6 mg/dL — ABNORMAL HIGH (ref 0.3–1.2)
Total Protein: 6.3 g/dL — ABNORMAL LOW (ref 6.5–8.1)

## 2021-11-30 LAB — CBC
HCT: 36.1 % (ref 36.0–46.0)
Hemoglobin: 10.8 g/dL — ABNORMAL LOW (ref 12.0–15.0)
MCH: 29.1 pg (ref 26.0–34.0)
MCHC: 29.9 g/dL — ABNORMAL LOW (ref 30.0–36.0)
MCV: 97.3 fL (ref 80.0–100.0)
Platelets: 115 10*3/uL — ABNORMAL LOW (ref 150–400)
RBC: 3.71 MIL/uL — ABNORMAL LOW (ref 3.87–5.11)
RDW: 16.9 % — ABNORMAL HIGH (ref 11.5–15.5)
WBC: 8.2 10*3/uL (ref 4.0–10.5)
nRBC: 0 % (ref 0.0–0.2)

## 2021-11-30 LAB — POTASSIUM: Potassium: 3.9 mmol/L (ref 3.5–5.1)

## 2021-11-30 LAB — GLUCOSE, CAPILLARY: Glucose-Capillary: 112 mg/dL — ABNORMAL HIGH (ref 70–99)

## 2021-11-30 MED ORDER — SODIUM CHLORIDE 0.9 % IV SOLN
1.0000 g | Freq: Two times a day (BID) | INTRAVENOUS | Status: DC
  Administered 2021-11-30 – 2021-12-01 (×4): 1 g via INTRAVENOUS
  Filled 2021-11-30 (×5): qty 20

## 2021-11-30 MED ORDER — DILTIAZEM HCL-DEXTROSE 125-5 MG/125ML-% IV SOLN (PREMIX)
5.0000 mg/h | INTRAVENOUS | Status: DC
  Administered 2021-11-30: 5 mg/h via INTRAVENOUS
  Administered 2021-12-01 – 2021-12-03 (×5): 10 mg/h via INTRAVENOUS
  Filled 2021-11-30 (×6): qty 125

## 2021-11-30 MED ORDER — LACTATED RINGERS IV SOLN: INTRAVENOUS | Status: DC

## 2021-11-30 MED ORDER — CHLORHEXIDINE GLUCONATE CLOTH 2 % EX PADS
6.0000 | MEDICATED_PAD | Freq: Every day | CUTANEOUS | Status: DC
  Administered 2021-11-30 – 2021-12-05 (×6): 6 via TOPICAL

## 2021-11-30 NOTE — Progress Notes (Signed)
MD at bedside and charge aware of patient LOC and HR. ?Cardizem drip started and fluids hung ? ? 11/30/21 1200  ?Assess: MEWS Score  ?Temp 97.9 ?F (36.6 ?C)  ?BP 140/89  ?ECG Heart Rate (!) 142  ?Resp 20  ?Level of Consciousness Responds to Voice  ?SpO2 94 %  ?O2 Device Room Air  ?Assess: MEWS Score  ?MEWS Temp 0  ?MEWS Systolic 0  ?MEWS Pulse 3  ?MEWS RR 0  ?MEWS LOC 1  ?MEWS Score 4  ?MEWS Score Color Red  ?Assess: if the MEWS score is Yellow or Red  ?Were vital signs taken at a resting state? Yes  ?Focused Assessment No change from prior assessment  ?Does the patient meet 2 or more of the SIRS criteria? No  ?MEWS guidelines implemented *See Row Information* Yes  ?Treat  ?MEWS Interventions Administered scheduled meds/treatments;Escalated (See documentation below)  ?Pain Scale PAINAD  ?Breathing 1  ?Negative Vocalization 0  ?Facial Expression 2  ?Body Language 1  ?Consolability 0  ?PAINAD Score 4  ?Take Vital Signs  ?Increase Vital Sign Frequency  Red: Q 1hr X 4 then Q 4hr X 4, if remains red, continue Q 4hrs  ?Escalate  ?MEWS: Escalate Red: discuss with charge nurse/RN and provider, consider discussing with RRT  ?Notify: Charge Nurse/RN  ?Name of Charge Nurse/RN Notified Caryl Pina RN  ?Date Charge Nurse/RN Notified 11/30/21  ?Time Charge Nurse/RN Notified 1200  ?Notify: Provider  ?Provider Name/Title Dr. Kurtis Bushman  ?Date Provider Notified 11/30/21  ?Time Provider Notified 1100  ?Notification Type Face-to-face  ?Provider response At bedside  ?Document  ?Patient Outcome Other (Comment) ?(new orders intitiated)  ?Assess: SIRS CRITERIA  ?SIRS Temperature  0  ?SIRS Pulse 1  ?SIRS Respirations  0  ?SIRS WBC 0  ?SIRS Score Sum  1  ? ? ?

## 2021-11-30 NOTE — Progress Notes (Signed)
Transferred to rm 256, gave report to [employer], no acute distress, vital signs stable.  ?

## 2021-11-30 NOTE — Consult Note (Signed)
Pharmacy Antibiotic Note ? ?Brittney Ramirez is a 82 y.o. female admitted on 11/24/2021 with pneumonia.  Pharmacy has been consulted for Meropenem dosing. Patient received 4 days of Azithromycin and 6 days of Rocephin. ? ?Plan: ?Meropenem 1g Q 12 hours ? ?Height: '5\' 6"'$  (167.6 cm) ?Weight: 83.3 kg (183 lb 10.3 oz) ?IBW/kg (Calculated) : 59.3 ? ?Temp (24hrs), Avg:98.2 ?F (36.8 ?C), Min:97.4 ?F (36.3 ?C), Max:99 ?F (37.2 ?C) ? ?Recent Labs  ?Lab 11/24/21 ?1029 11/24/21 ?1610 11/25/21 ?0530 11/26/21 ?0907 11/27/21 ?1123 11/28/21 ?4270 11/29/21 ?6237 11/30/21 ?0440  ?WBC  --   --  7.3 11.2* 7.9 8.6 6.9 8.2  ?CREATININE  --   --  1.25* 1.63* 2.08* 1.60* 1.09*  --   ?LATICACIDVEN 1.5 2.0*  --   --   --   --   --   --   ?  ?Estimated Creatinine Clearance: 43.3 mL/min (A) (by C-G formula based on SCr of 1.09 mg/dL (H)).   ? ?Allergies  ?Allergen Reactions  ? Morphine And Related Nausea And Vomiting  ?  Diarrhea  ? Buprenorphine Hcl Nausea And Vomiting  ?  Diarrhea  ? Codeine Nausea Only  ?  GI upset ?  ? Desipramine Nausea Only  ? Fosamax [Alendronate] Rash  ? ? ?Antimicrobials this admission: ?Meropenem 4/15 >>  ?Azithro 4/9 >> 4/12 ?Rocephin 4/9 >> 4/14 ? ?Dose adjustments this admission: ?N/a.  ? ?Microbiology results: ?4/9 BCx: NGTD ?4/10 UCx: multiple species, likely a contaminant  ?4/12 Sputum: pending  ? ? ?Thank you for allowing pharmacy to be a part of this patient?s care. ? ?Pearla Dubonnet ?11/30/2021 11:27 AM ? ?

## 2021-11-30 NOTE — Progress Notes (Signed)
?PROGRESS NOTE ? ? ? ?Brittney Ramirez  VOH:[id number] DOB: 06-01-1941 DOA: 11/24/2021 ?PCP: Adin Hector, MD  ? ? ?Brief Narrative:  ?hypothyroidism, OSA not on CPAP, CAD, CKD-3, CHF, atrial fibrillation on Xarelto, dementia, iron deficiency anemia, who presents with shortness of breath and altered mental status. ?  ?Patient was recently hospitalized from 3/2 - 3/4 due to right hip fracture.  Patient is s/p of right hip surgery. She finished course of rehab.  Per her son and husband at the bedside, in the past 2 days, patient has shortness breath which has been progressively worsening.  No chest pain, fever or chills.  Has mild dry cough.  Patient has history of dementia, but most of the time patient recognizes family members, is oriented to time and place. Patient is more confused today. ?  ?Pulmonary embolism ruled out.  Presentation consistent with acute decompensated heart failure.  Started on IV diuretics.  Atrial fibrillation with uncontrolled rate ? Volume status improved however kidney function worsen and dehydration noted on 4/11 ? ? ?4/12 patient had rapid response earlier this morning with A-fib RVR heart rate in 130s to 140s.  Patient was transferred to stepdown started on Cardizem drip. ?This am pt confused, not following commands. Has not taken po intake yet. Not able to give some of her po meds . MRI this am with small remote lacunar infarct of rt thalamus. ?Tele afib rate 80's to 90's on  cardizem gtt. ? ? ?4/13 awake more alert. Recognizes husband, answers questions. Ate 50% of her breakfast. Took meds crushed.  ?4/14 confused this am . Husband at bedside. Refusing eating, or taking meds. ?4/15 this am was told family at bedside with multiple questions and, being aggressive. Saw pt and had long discussion with family about pts status. Tried to explain to them about dementia (which husband kept denying has this ), delirium, inpt delirium and now has asp. Pna. Discussed giving ivf, but have to be  cautious due to her chf.. Family aggressive. Told they were not updated. Explained to them , pts husband at bedside every morning and he has been personally updated, husband also agreed with this, Told family if they would all like to get updated, they can assign someone for [place] to talk to daily, but otherwise will continue updating pts husband at his request. Nsg was also at bedside, tried to answer questions about the same issues as I discussed, tried to help family understand about her medical condition. I also discussed with them, that once her MS at more at baseline, will have SPL evaluate her swallowing. Discussed with family if she continues not to eat , then need to decided on NGT/feeding tube placement. They finally calm down. I asked Dr. Quinn Axe to talk to family about her dementia and delirium too. ? ? ?Consultants:  ?Cardiology ?neurology ? ? ? ?Procedures: CT , MRI ? ?Antimicrobials:  ?Rocephin, azithromycin ? ? ?Subjective: ?Moaning, confused.  ? ? ?Objective: ?Vitals:  ? 11/30/21 1200 11/30/21 1406 11/30/21 1450 11/30/21 1610  ?BP: 140/89 (!) 151/88 (!) 134/107 (!) 156/95  ?Pulse:  (!) 120 97 93  ?Resp: 20 (!) '24 20 20  '$ ?Temp: 97.9 ?F (36.6 ?C)   98.3 ?F (36.8 ?C)  ?TempSrc: Axillary     ?SpO2: 94%  92% 92%  ?Weight:      ?Height:      ? ? ?Intake/Output Summary (Last 24 hours) at 11/30/2021 1747 ?Last data filed at 11/30/2021 1704 ?Gross per 24 hour  ?  Intake 214.74 ml  ?Output 1025 ml  ?Net -810.26 ml  ? ?Filed Weights  ? 11/27/21 0500 11/29/21 0600 11/30/21 0500  ?Weight: 86.3 kg 85.6 kg 83.3 kg  ? ? ?Examination: ?Moaning in bed, confused ?Coarse bs, no wheezing ?Reg s1/s2 no gallop ?Soft benign +bs ?No edema ?Grossly intact, confused. ?Mood and affect appropriate in current setting  ? ? ? ?Data Reviewed: I have personally reviewed following labs and imaging studies ? ?CBC: ?Recent Labs  ?Lab 11/26/21 ?0907 11/27/21 ?1123 11/28/21 ?8828 11/29/21 ?0034 11/30/21 ?0440  ?WBC 11.2* 7.9 8.6 6.9 8.2   ?NEUTROABS 8.6*  --   --   --   --   ?HGB 11.2* 10.0* 9.5* 9.6* 10.8*  ?HCT 37.9 33.1* 31.9* 31.8* 36.1  ?MCV 100.3* 98.2 98.5 97.2 97.3  ?PLT 195 165 147* 126* 115*  ? ?Basic Metabolic Panel: ?Recent Labs  ?Lab 11/24/21 ?1158 11/25/21 ?0530 11/26/21 ?0907 11/27/21 ?1123 11/28/21 ?9179 11/29/21 ?1505 11/30/21 ?0440  ?NA  --  144 147* 147* 145 141  --   ?K  --  4.0 4.2 3.4* 3.1* 3.0* 3.9  ?CL  --  110 109 111 111 107  --   ?CO2  --  20* 20* '22 24 25  '$ --   ?GLUCOSE  --  126* 101* 124* 141* 127*  --   ?BUN  --  19 30* 47* 44* 30*  --   ?CREATININE  --  1.25* 1.63* 2.08* 1.60* 1.09*  --   ?CALCIUM  --  9.2 9.3 8.6* 8.4* 8.4*  --   ?MG 1.7 1.8 1.9  --   --   --   --   ? ?GFR: ?Estimated Creatinine Clearance: 43.3 mL/min (A) (by C-G formula based on SCr of 1.09 mg/dL (H)). ?Liver Function Tests: ?Recent Labs  ?Lab 11/24/21 ?0900 11/27/21 ?1123 11/28/21 ?6979 11/29/21 ?4801 11/30/21 ?0440  ?AST 21 1,407* 759* 353* 216*  ?ALT 10 1,107* 811* 575* 494*  ?ALKPHOS 87 102 97 89 95  ?BILITOT 1.5* 1.4* 1.2 1.4* 1.6*  ?PROT 6.3* 6.3* 6.1* 5.7* 6.3*  ?ALBUMIN 3.4* 3.4* 3.4* 3.3* 3.4*  ? ?No results for input(s): LIPASE, AMYLASE in the last 168 hours. ?Recent Labs  ?Lab 11/27/21 ?1558  ?AMMONIA 26  ? ?Coagulation Profile: ?Recent Labs  ?Lab 11/28/21 ?6553 11/29/21 ?1648  ?INR 2.3* 1.8*  ? ?Cardiac Enzymes: ?No results for input(s): CKTOTAL, CKMB, CKMBINDEX, TROPONINI in the last 168 hours. ?BNP (last 3 results) ?No results for input(s): PROBNP in the last 8760 hours. ?HbA1C: ?No results for input(s): HGBA1C in the last 72 hours. ?CBG: ?Recent Labs  ?Lab 11/27/21 ?0430 11/27/21 ?1646 11/28/21 ?7482 11/29/21 ?0803 11/30/21 ?7078  ?GLUCAP 126* 131* 134* 122* 112*  ? ?Lipid Profile: ?No results for input(s): CHOL, HDL, LDLCALC, TRIG, CHOLHDL, LDLDIRECT in the last 72 hours. ?Thyroid Function Tests: ?No results for input(s): TSH, T4TOTAL, FREET4, T3FREE, THYROIDAB in the last 72 hours. ?Anemia Panel: ?No results for input(s): VITAMINB12,  FOLATE, FERRITIN, TIBC, IRON, RETICCTPCT in the last 72 hours. ?Sepsis Labs: ?Recent Labs  ?Lab 11/24/21 ?1029 11/24/21 ?1610  ?PROCALCITON  --  <0.10  ?LATICACIDVEN 1.5 2.0*  ? ? ?Recent Results (from the past 240 hour(s))  ?Blood Culture (routine x 2)     Status: None  ? Collection Time: 11/24/21 10:29 AM  ? Specimen: BLOOD  ?Result Value Ref Range Status  ? Specimen Description BLOOD LEFT ANTECUBITAL  Final  ? Special Requests   Final  ?  BOTTLES DRAWN  AEROBIC AND ANAEROBIC Blood Culture adequate volume  ? Culture   Final  ?  NO GROWTH 5 DAYS ?Performed at [facility], [address] [place], [place] [address] ?  ? Report Status 11/29/2021 FINAL  Final  ?Blood Culture (routine x 2)     Status: None  ? Collection Time: 11/24/21 10:29 AM  ? Specimen: BLOOD  ?Result Value Ref Range Status  ? Specimen Description BLOOD RIGHT ANTECUBITAL  Final  ? Special Requests   Final  ?  BOTTLES DRAWN AEROBIC AND ANAEROBIC Blood Culture adequate volume  ? Culture   Final  ?  NO GROWTH 5 DAYS ?Performed at [facility], [address] [place], [place] [address] ?  ? Report Status 11/29/2021 FINAL  Final  ?Resp Panel by RT-PCR (Flu A&B, Covid) Nasopharyngeal Swab     Status: None  ? Collection Time: 11/24/21 11:20 AM  ? Specimen: Nasopharyngeal Swab; Nasopharyngeal(NP) swabs in vial transport medium  ?Result Value Ref Range Status  ? SARS Coronavirus 2 by RT PCR NEGATIVE NEGATIVE Final  ?  Comment: (NOTE) ?SARS-CoV-2 target nucleic acids are NOT DETECTED. ? ?The SARS-CoV-2 RNA is generally detectable in upper respiratory ?specimens during the acute phase of infection. The lowest ?concentration of SARS-CoV-2 viral copies this assay can detect is ?138 copies/mL. A negative result does not preclude SARS-Cov-2 ?infection and should not be used as the sole basis for treatment or ?other patient management decisions. A negative result may occur with  ?improper specimen collection/handling, submission of specimen  other ?than nasopharyngeal swab, presence of viral mutation(s) within the ?areas targeted by this assay, and inadequate number of viral ?copies(<138 copies/mL). A negative result must be combined with ?clinical obse

## 2021-11-30 NOTE — [place] FL2 (Signed)
?[place] MEDICAID FL2 LEVEL OF CARE SCREENING TOOL  ?  ? ?IDENTIFICATION  ?Patient Name: ?Brittney Ramirez Birthdate: [birth year] Sex: female Admission Date (Current Location): ?11/24/2021  ?[place] and [place] Number: ?   ?  Facility and Address:  ?[facility] , [address], [place], [place] [address] ?     Provider Number: ?[id number]  ?Attending Physician Name and Address:  ?Nolberto Hanlon, MD ? Relative Name and Phone Number:  ?Rolan Lipa (spouse) [phone] ?   ?Current Level of Care: ?Hospital Recommended Level of Care: ?[place] Prior Approval Number: ?  ? ?Date Approved/Denied: ?  PASRR Number: ?[id number] A ? ?Discharge Plan: ?SNF ?  ? ?Current Diagnoses: ?Patient Active Problem List  ? Diagnosis Date Noted  ? Chronic diastolic CHF (congestive heart failure) ([place]) 11/24/2021  ? CAD (coronary artery disease) 11/24/2021  ? Iron deficiency anemia 11/24/2021  ? Dementia ([place]) 11/24/2021  ? Hypokalemia 11/24/2021  ? Acute metabolic encephalopathy 60/05/9322  ? Acute on chronic diastolic CHF (congestive heart failure) ([place]) 11/24/2021  ? Status post total hip replacement, right 10/17/2021  ? Inability to ambulate due to right hip 09/19/2020  ? Weakness 09/18/2020  ? Cerumen debris on tympanic membrane, right 06/17/2018  ? UTI (urinary tract infection) 06/17/2018  ? Multiple rib fractures 01/27/2018  ? Advanced care planning/counseling discussion 06/11/2017  ? Thoracic aortic aneurysm without rupture ([place]) 12/09/2016  ? Aortic calcification ([place]) 12/09/2016  ? SOBOE (shortness of breath on exertion) 11/03/2016  ? Gastroesophageal reflux disease without esophagitis 05/14/2016  ? CKD (chronic kidney disease) stage 3, GFR 30-59 ml/min (HCC) 05/14/2016  ? Atrial fibrillation ([place]) 12/05/2015  ? Hypothyroidism 12/05/2015  ? Hyperlipidemia 12/05/2015  ? Hypertensive heart and renal disease with congestive heart failure and renal failure ([place]) 06/07/2015  ? CHF (congestive heart  failure), NYHA class II, chronic, systolic ([place]) 55/73/2202  ? Pulmonary nodules 05/07/2015  ? Chronic systolic CHF (congestive heart failure), NYHA class 2 ([place]) 10/11/2014  ? Benign essential HTN 04/06/2014  ? Chronic a-fib ([place]) 04/06/2014  ? ? ?Orientation RESPIRATION BLADDER Height & Weight   ?  ? (disoriented x4) ? Normal Incontinent, External catheter Weight: 183 lb 10.3 oz (83.3 kg) ?Height:  '5\' 6"'$  (167.6 cm)  ?BEHAVIORAL SYMPTOMS/MOOD NEUROLOGICAL BOWEL NUTRITION STATUS  ?    Incontinent Diet (see discharge summary)  ?AMBULATORY STATUS COMMUNICATION OF NEEDS Skin   ?Extensive Assist Verbally Normal ?  ?  ?  ?    ?     ?     ? ? ?Personal Care Assistance Level of Assistance  ?Bathing, Feeding, Dressing, Total care Bathing Assistance: Limited assistance ?Feeding assistance: Limited assistance ?Dressing Assistance: Maximum assistance ?Total Care Assistance: Maximum assistance  ? ?Functional Limitations Info  ?Sight, Hearing, Speech Sight Info: Impaired ?Hearing Info: Adequate ?Speech Info: Adequate  ? ? ?SPECIAL CARE FACTORS FREQUENCY  ?PT (By licensed PT), OT (By licensed OT)   ?  ?PT Frequency: min 4x weekly ?OT Frequency: min 4x weekly ?  ?  ?  ?   ? ? ?Contractures Contractures Info: Not present  ? ? ?Additional Factors Info  ?Code Status, Allergies Code Status Info: DNR ?Allergies Info: Morphine And Related   Buprenorphine Hcl   Codeine   Desipramine   Fosamax (Alendronate) ?  ?  ?  ?   ? ?Current Medications (11/30/2021):  This is the current hospital active medication list ?Current Facility-Administered Medications  ?Medication Dose Route Frequency Provider Last Rate Last Admin  ? 0.9 %  sodium chloride infusion   Intravenous PRN Sidney Ace, MD   Stopped at 11/26/21 1844  ? acetaminophen (TYLENOL) tablet 650 mg  650 mg Oral Q6H PRN Ivor Costa, MD   650 mg at 11/24/21 2156  ? albuterol (PROVENTIL) (2.5 MG/3ML) 0.083% nebulizer solution 2.5 mg  2.5 mg Inhalation Q4H PRN Ivor Costa, MD      ?  Chlorhexidine Gluconate Cloth 2 % PADS 6 each  6 each Topical Daily Nolberto Hanlon, MD      ? diltiazem (CARDIZEM) injection 10 mg  10 mg Intravenous Q6H PRN Ralene Muskrat B, MD   10 mg at 11/27/21 0107  ? enoxaparin (LOVENOX) injection 85 mg  1 mg/kg Subcutaneous Q12H Vira Blanco, RPH   85 mg at 11/30/21 7322  ? erythromycin ophthalmic ointment 1 application.  1 application. Left Eye BID Ivor Costa, MD   1 application. at 11/29/21 2125  ? hydrALAZINE (APRESOLINE) injection 5 mg  5 mg Intravenous Q2H PRN Ivor Costa, MD   5 mg at 11/28/21 1748  ? lactated ringers infusion   Intravenous Continuous Nolberto Hanlon, MD      ? latanoprost (XALATAN) 0.005 % ophthalmic solution 1 drop  1 drop Both Eyes QHS Ivor Costa, MD   1 drop at 11/29/21 2125  ? levothyroxine (SYNTHROID) tablet 88 mcg  88 mcg Oral QAC breakfast Ivor Costa, MD   88 mcg at 11/25/21 0254  ? LORazepam (ATIVAN) injection 0.5 mg  0.5 mg Intravenous Once PRN Ralene Muskrat B, MD      ? meropenem (MERREM) 1 g in sodium chloride 0.9 % 100 mL IVPB  1 g Intravenous Q12H Nolberto Hanlon, MD      ? methocarbamol (ROBAXIN) tablet 500 mg  500 mg Oral Q8H PRN Ivor Costa, MD   500 mg at 11/25/21 0347  ? metoprolol tartrate (LOPRESSOR) injection 5 mg  5 mg Intravenous Q3H PRN Nolberto Hanlon, MD   5 mg at 11/30/21 1024  ? metoprolol tartrate (LOPRESSOR) tablet 50 mg  50 mg Oral BID Nolberto Hanlon, MD   50 mg at 11/28/21 2706  ? ondansetron (ZOFRAN) injection 4 mg  4 mg Intravenous Q8H PRN Ivor Costa, MD      ? vitamin B-12 (CYANOCOBALAMIN) tablet 1,000 mcg  1,000 mcg Oral Daily Ivor Costa, MD   1,000 mcg at 11/28/21 2376  ? ? ? ?Discharge Medications: ?Please see discharge summary for a list of discharge medications. ? ?Relevant Imaging Results: ? ?Relevant Lab Results: ? ? ?Additional Information ?EGB:[id number] ? ?Alberteen Sam, LCSW ? ? ? ? ?

## 2021-11-30 NOTE — Procedures (Signed)
Routine EEG Report ? ?Brittney Ramirez is a 81 y.o. female with a history of encephalopathy who is undergoing an EEG to evaluate for seizures. ? ?Report: This EEG was acquired with electrodes placed according to the International 10-20 electrode system (including Fp1, Fp2, F3, F4, C3, C4, P3, P4, O1, O2, T3, T4, T5, T6, A1, A2, Fz, Cz, Pz). The following electrodes were missing or displaced: none. ? ?The occipital dominant rhythm was 4-5 Hz. This activity is reactive to stimulation. Drowsiness was manifested by background fragmentation; deeper stages of sleep were identified by K complexes and sleep spindles. There was no focal slowing. There were no interictal epileptiform discharges. There were no electrographic seizures identified. Photic stimulation and hyperventilation were not performed. ? ?Impression and clinical correlation: This EEG was obtained while awake and asleep and is abnormal due to moderate diffuse slowing indicative of global cerebral dysfunction. Epileptiform abnormalities were not seen during this recording. ? ?Su Monks, MD ?[facility] ?[phone] ? ?If 7pm- 7am, please page neurology on call as listed in [place].  ?

## 2021-11-30 NOTE — TOC Initial Note (Signed)
Transition of Care (TOC) - Initial/Assessment Note  ? ? ?Patient Details  ?Name: Brittney Ramirez ?MRN: [id number] ?Date of Birth: 02/11/1941 ? ?Transition of Care (TOC) CM/SW Contact:    ?Alberteen Sam, LCSW ?Phone Number: ?11/30/2021, 2:27 PM ? ?Clinical Narrative:                 ? ?CSW spoke with patient's spouse Johnnie regarding PT recs of SNF. Johnnie expressed hesitation with this plan however agreeable for CSW to send out referral to local facilities to see what bed offers he gets back.  ? ?TOC has sent out referrals pending bed offers at this time.  ? ? ?Expected Discharge Plan: [place] ?Barriers to Discharge: Continued Medical Work up ? ? ?Patient Goals and CMS Choice ?Patient states their goals for this hospitalization and ongoing recovery are:: to go home ?CMS Medicare.gov Compare Post Acute Care list provided to:: Patient Represenative (must comment) ?Choice offered to / list presented to : Spouse ? ?Expected Discharge Plan and Services ?Expected Discharge Plan: [place] ?  ?  ?  ?Living arrangements for the past 2 months: [place] ?                ?  ?  ?  ?  ?  ?  ?  ?  ?  ?  ? ?Prior Living Arrangements/Services ?Living arrangements for the past 2 months: [place] ?Lives with:: Spouse ?  ?       ?  ?  ?  ?  ? ?Activities of Daily Living ?Home Assistive Devices/Equipment: [place] Rile (specify type) ?ADL Screening (condition at time of admission) ?Patient's cognitive ability adequate to safely complete daily activities?: Yes ?Is the patient deaf or have difficulty hearing?: No ?Does the patient have difficulty seeing, even when wearing glasses/contacts?: Yes ?Does the patient have difficulty concentrating, remembering, or making decisions?: No ?Patient able to express need for assistance with ADLs?: Yes ?Does the patient have difficulty dressing or bathing?: Yes ?Independently performs ADLs?: No ?Communication: Independent ?Dressing (OT): Needs assistance ?Is  this a change from baseline?: Pre-admission baseline ?Grooming: Needs assistance ?Is this a change from baseline?: Pre-admission baseline ?Feeding: Independent ?Bathing: Needs assistance ?Is this a change from baseline?: Pre-admission baseline ?Toileting: Needs assistance ?Is this a change from baseline?: Pre-admission baseline ?In/Out Bed: Needs assistance ?Is this a change from baseline?: Pre-admission baseline ?Walks in Home: Independent with device (comment) ?Does the patient have difficulty walking or climbing stairs?: Yes ?Weakness of Legs: Both ?Weakness of Arms/Hands: None ? ?Permission Sought/Granted ?  ?  ?   ?   ?   ?   ? ?Emotional Assessment ?  ?  ?  ?  ?  ?  ? ?Admission diagnosis:  CAP (community acquired pneumonia) [J18.9] ?Community acquired pneumonia of left lower lobe of lung [J18.9] ?Acute on chronic diastolic CHF (congestive heart failure) ([place]) [I50.33] ?Patient Active Problem List  ? Diagnosis Date Noted  ? Chronic diastolic CHF (congestive heart failure) ([place]) 11/24/2021  ? CAD (coronary artery disease) 11/24/2021  ? Iron deficiency anemia 11/24/2021  ? Dementia ([place]) 11/24/2021  ? Hypokalemia 11/24/2021  ? Acute metabolic encephalopathy 02/11/9484  ? Acute on chronic diastolic CHF (congestive heart failure) ([place]) 11/24/2021  ? Status post total hip replacement, right 10/17/2021  ? Inability to ambulate due to right hip 09/19/2020  ? Weakness 09/18/2020  ? Cerumen debris on tympanic membrane, right 06/17/2018  ? UTI (urinary tract infection) 06/17/2018  ?  Multiple rib fractures 01/27/2018  ? Advanced care planning/counseling discussion 06/11/2017  ? Thoracic aortic aneurysm without rupture ([place]) 12/09/2016  ? Aortic calcification ([place]) 12/09/2016  ? SOBOE (shortness of breath on exertion) 11/03/2016  ? Gastroesophageal reflux disease without esophagitis 05/14/2016  ? CKD (chronic kidney disease) stage 3, GFR 30-59 ml/min (HCC) 05/14/2016  ? Atrial fibrillation ([place]) 12/05/2015  ?  Hypothyroidism 12/05/2015  ? Hyperlipidemia 12/05/2015  ? Hypertensive heart and renal disease with congestive heart failure and renal failure ([place]) 06/07/2015  ? CHF (congestive heart failure), NYHA class II, chronic, systolic ([place]) 16/96/7893  ? Pulmonary nodules 05/07/2015  ? Chronic systolic CHF (congestive heart failure), NYHA class 2 ([place]) 10/11/2014  ? Benign essential HTN 04/06/2014  ? Chronic a-fib ([place]) 04/06/2014  ? ?PCP:  Adin Hector, MD ?Pharmacy:   ?[facility] [address] ?[place] ?[place] [address] ?Phone: [phone] Fax: [phone] ? ? ? ? ?Social Determinants of Health (SDOH) Interventions ?  ? ?Readmission Risk Interventions ?   ? View : No data to display.  ?  ?  ?  ? ? ? ?

## 2021-12-01 DIAGNOSIS — I5033 Acute on chronic diastolic (congestive) heart failure: Secondary | ICD-10-CM | POA: Diagnosis not present

## 2021-12-01 LAB — HEPATIC FUNCTION PANEL
ALT: 324 U/L — ABNORMAL HIGH (ref 0–44)
AST: 101 U/L — ABNORMAL HIGH (ref 15–41)
Albumin: 3.5 g/dL (ref 3.5–5.0)
Alkaline Phosphatase: 100 U/L (ref 38–126)
Bilirubin, Direct: 0.7 mg/dL — ABNORMAL HIGH (ref 0.0–0.2)
Indirect Bilirubin: 1.7 mg/dL — ABNORMAL HIGH (ref 0.3–0.9)
Total Bilirubin: 2.4 mg/dL — ABNORMAL HIGH (ref 0.3–1.2)
Total Protein: 6.4 g/dL — ABNORMAL LOW (ref 6.5–8.1)

## 2021-12-01 LAB — GLUCOSE, CAPILLARY: Glucose-Capillary: 111 mg/dL — ABNORMAL HIGH (ref 70–99)

## 2021-12-01 NOTE — Progress Notes (Addendum)
?PROGRESS NOTE ? ? ? ?Brittney Ramirez  EXN:[id number] DOB: [birth year] DOA: 11/24/2021 ?PCP: Adin Hector, MD  ? ? ?Brief Narrative:  ?hypothyroidism, OSA not on CPAP, CAD, CKD-3, CHF, atrial fibrillation on Xarelto, dementia, iron deficiency anemia, who presents with shortness of breath and altered mental status. ?  ?Patient was recently hospitalized from 3/2 - 3/4 due to right hip fracture.  Patient is s/p of right hip surgery. She finished course of rehab.  Per her son and husband at the bedside, in the past 2 days, patient has shortness breath which has been progressively worsening.  No chest pain, fever or chills.  Has mild dry cough.  Patient has history of dementia, but most of the time patient recognizes family members, is oriented to time and place. Patient is more confused today. ?  ?Pulmonary embolism ruled out.  Presentation consistent with acute decompensated heart failure.  Started on IV diuretics.  Atrial fibrillation with uncontrolled rate ? Volume status improved however kidney function worsen and dehydration noted on 4/11 ? ? ?4/12 patient had rapid response earlier this morning with A-fib RVR heart rate in 130s to 140s.  Patient was transferred to stepdown started on Cardizem drip. ?This am pt confused, not following commands. Has not taken po intake yet. Not able to give some of her po meds . MRI this am with small remote lacunar infarct of rt thalamus. ?Tele afib rate 80's to 90's on  cardizem gtt. ? ? ?4/13 awake more alert. Recognizes husband, answers questions. Ate 50% of her breakfast. Took meds crushed.  ?4/14 confused this am . Husband at bedside. Refusing eating, or taking meds. ?4/15 this am was told family at bedside with multiple questions and, being aggressive. Saw pt and had long discussion with family about pts status. Tried to explain to them about dementia (which husband kept denying has this ), delirium, inpt delirium and now has asp. Pna. Discussed giving ivf, but have to be  cautious due to her chf.. Family aggressive. Told they were not updated. Explained to them , pts husband at bedside every morning and he has been personally updated, husband also agreed with this, Told family if they would all like to get updated, they can assign someone for [place] to talk to daily, but otherwise will continue updating pts husband at his request. Nsg was also at bedside, tried to answer questions about the same issues as I discussed, tried to help family understand about her medical condition. I also discussed with them, that once her MS at more at baseline, will have SPL evaluate her swallowing. Discussed with family if she continues not to eat , then need to decided on NGT/feeding tube placement. They finally calm down. I asked Dr. Quinn Axe to talk to family about her dementia and delirium too. ? ?4/16 moaning this am. Eyes open.  ? ? ?Consultants:  ?Cardiology ?neurology ? ? ? ?Procedures: CT , MRI ? ?Antimicrobials:  ?Rocephin, azithromycin ? ? ?Subjective: ?Confused, moaning when calling her name HR better controlled ? ? ?Objective: ?Vitals:  ? 11/30/21 2350 12/01/21 0329 12/01/21 0416 12/01/21 0804  ?BP: (!) 157/83 (!) 154/84  (!) 155/84  ?Pulse: 80 89  88  ?Resp: '20 20  20  '$ ?Temp: 97.8 ?F (36.6 ?C) 97.9 ?F (36.6 ?C)    ?TempSrc: Oral Axillary    ?SpO2: 90% 95%  95%  ?Weight:   85.3 kg   ?Height:      ? ? ?Intake/Output Summary (Last 24 hours)  at 12/01/2021 0859 ?Last data filed at 12/01/2021 0753 ?Gross per 24 hour  ?Intake 1153.84 ml  ?Output 950 ml  ?Net 203.84 ml  ? ?Filed Weights  ? 11/29/21 0600 11/30/21 0500 12/01/21 0416  ?Weight: 85.6 kg 83.3 kg 85.3 kg  ? ? ?Examination: ?Calm, maoning, confused ?Coarse bs, no wheezing ?Irreg s1/s2 no gallop ?Soft benign +bs ?No edema ?Confused. ?Mood and affect appropriate in current setting  ? ? ? ?Data Reviewed: I have personally reviewed following labs and imaging studies ? ?CBC: ?Recent Labs  ?Lab 11/26/21 ?0907 11/27/21 ?1123 11/28/21 ?1245  11/29/21 ?8099 11/30/21 ?0440  ?WBC 11.2* 7.9 8.6 6.9 8.2  ?NEUTROABS 8.6*  --   --   --   --   ?HGB 11.2* 10.0* 9.5* 9.6* 10.8*  ?HCT 37.9 33.1* 31.9* 31.8* 36.1  ?MCV 100.3* 98.2 98.5 97.2 97.3  ?PLT 195 165 147* 126* 115*  ? ?Basic Metabolic Panel: ?Recent Labs  ?Lab 11/24/21 ?1158 11/25/21 ?0530 11/26/21 ?0907 11/27/21 ?1123 11/28/21 ?8338 11/29/21 ?2505 11/30/21 ?0440  ?NA  --  144 147* 147* 145 141  --   ?K  --  4.0 4.2 3.4* 3.1* 3.0* 3.9  ?CL  --  110 109 111 111 107  --   ?CO2  --  20* 20* '22 24 25  '$ --   ?GLUCOSE  --  126* 101* 124* 141* 127*  --   ?BUN  --  19 30* 47* 44* 30*  --   ?CREATININE  --  1.25* 1.63* 2.08* 1.60* 1.09*  --   ?CALCIUM  --  9.2 9.3 8.6* 8.4* 8.4*  --   ?MG 1.7 1.8 1.9  --   --   --   --   ? ?GFR: ?Estimated Creatinine Clearance: 43.8 mL/min (A) (by C-G formula based on SCr of 1.09 mg/dL (H)). ?Liver Function Tests: ?Recent Labs  ?Lab 11/24/21 ?0900 11/27/21 ?1123 11/28/21 ?3976 11/29/21 ?7341 11/30/21 ?0440  ?AST 21 1,407* 759* 353* 216*  ?ALT 10 1,107* 811* 575* 494*  ?ALKPHOS 87 102 97 89 95  ?BILITOT 1.5* 1.4* 1.2 1.4* 1.6*  ?PROT 6.3* 6.3* 6.1* 5.7* 6.3*  ?ALBUMIN 3.4* 3.4* 3.4* 3.3* 3.4*  ? ?No results for input(s): LIPASE, AMYLASE in the last 168 hours. ?Recent Labs  ?Lab 11/27/21 ?1558  ?AMMONIA 26  ? ?Coagulation Profile: ?Recent Labs  ?Lab 11/28/21 ?9379 11/29/21 ?1648  ?INR 2.3* 1.8*  ? ?Cardiac Enzymes: ?No results for input(s): CKTOTAL, CKMB, CKMBINDEX, TROPONINI in the last 168 hours. ?BNP (last 3 results) ?No results for input(s): PROBNP in the last 8760 hours. ?HbA1C: ?No results for input(s): HGBA1C in the last 72 hours. ?CBG: ?Recent Labs  ?Lab 11/27/21 ?1646 11/28/21 ?0240 11/29/21 ?0803 11/30/21 ?9735 12/01/21 ?3299  ?GLUCAP 131* 134* 122* 112* 111*  ? ?Lipid Profile: ?No results for input(s): CHOL, HDL, LDLCALC, TRIG, CHOLHDL, LDLDIRECT in the last 72 hours. ?Thyroid Function Tests: ?No results for input(s): TSH, T4TOTAL, FREET4, T3FREE, THYROIDAB in the last 72  hours. ?Anemia Panel: ?No results for input(s): VITAMINB12, FOLATE, FERRITIN, TIBC, IRON, RETICCTPCT in the last 72 hours. ?Sepsis Labs: ?Recent Labs  ?Lab 11/24/21 ?1029 11/24/21 ?1610  ?PROCALCITON  --  <0.10  ?LATICACIDVEN 1.5 2.0*  ? ? ?Recent Results (from the past 240 hour(s))  ?Blood Culture (routine x 2)     Status: None  ? Collection Time: 11/24/21 10:29 AM  ? Specimen: BLOOD  ?Result Value Ref Range Status  ? Specimen Description BLOOD LEFT ANTECUBITAL  Final  ?  Special Requests   Final  ?  BOTTLES DRAWN AEROBIC AND ANAEROBIC Blood Culture adequate volume  ? Culture   Final  ?  NO GROWTH 5 DAYS ?Performed at [facility], [address] [place], [place] [address] ?  ? Report Status 11/29/2021 FINAL  Final  ?Blood Culture (routine x 2)     Status: None  ? Collection Time: 11/24/21 10:29 AM  ? Specimen: BLOOD  ?Result Value Ref Range Status  ? Specimen Description BLOOD RIGHT ANTECUBITAL  Final  ? Special Requests   Final  ?  BOTTLES DRAWN AEROBIC AND ANAEROBIC Blood Culture adequate volume  ? Culture   Final  ?  NO GROWTH 5 DAYS ?Performed at [facility], [address], [place], [place] [address] ?  ? Report Status 11/29/2021 FINAL  Final  ?Resp Panel by RT-PCR (Flu A&B, Covid) Nasopharyngeal Swab     Status: None  ? Collection Time: 11/24/21 11:20 AM  ? Specimen: Nasopharyngeal Swab; Nasopharyngeal(NP) swabs in vial transport medium  ?Result Value Ref Range Status  ? SARS Coronavirus 2 by RT PCR NEGATIVE NEGATIVE Final  ?  Comment: (NOTE) ?SARS-CoV-2 target nucleic acids are NOT DETECTED. ? ?The SARS-CoV-2 RNA is generally detectable in upper respiratory ?specimens during the acute phase of infection. The lowest ?concentration of SARS-CoV-2 viral copies this assay can detect is ?138 copies/mL. A negative result does not preclude SARS-Cov-2 ?infection and should not be used as the sole basis for treatment or ?other patient management decisions. A negative result may occur with   ?improper specimen collection/handling, submission of specimen other ?than nasopharyngeal swab, presence of viral mutation(s) within the ?areas targeted by this assay, and inadequate number of viral ?copies(<138 copies/mL).

## 2021-12-01 NOTE — Consult Note (Signed)
NEUROLOGY CONSULTATION NOTE  ? ?Date of service: December 01, 2021 ?Patient Name: Brittney Ramirez ?MRN:  [id number] ?DOB:  04/17/1941 ?Reason for consult: encephalopathy ?Requesting physician: Dr. Nolberto Hanlon ?_ _ _   _ __   _ __ _ _  __ __   _ __   __ _ ? ?History of Present Illness  ? ?This is a 81 year old woman with a history of dementia, iron deficiency anemia, A-fib on Xarelto, hypothyroidism, OSA not on CPAP, CAD, CKD stage III, CHF who is admitted with acute decompensated heart failure.  Her stay has been complicated by AKI and acute hepatic injury as well as A-fib with RVR requiring Cardizem drip.  She had a urinary tract infection and was treated with Rocephin.  Her mental status has started to improve however over the last 2 days has deteriorated.  Attending states that she sounded gurgly on exam yesterday and chest x-ray was obtained which was concerning for aspiration pneumonia.  Rocephin was changed to meropenem.  Today patient is awake but mumbling without intelligible speech and is minimally interactive.   ? ?Outpatient neurology notes were reviewed which shows concern for neurodegenerative process.  Specifically her SLUMS ([place] Mental Status Exam) was 66 in 2022 (cutoff for dementia is 19) and volumetric analysis of MRI brain demonstrated her to be < 1st percentile of age and sex matched controls. She has been on donepezil as an outpatient but this was d/c'd by family 2/2 nightmares.  ? ?Creatinine and liver enzymes are improving. MRI brain this admission personally reviewed, motion-degraded but with that caveat NAICP noted. rEEG last week showed moderate diffuse slowing with no epileptiform abnl.  ?  ?ROS  ? ?UTA 2/2 encephalopathy ? ?Past History  ? ?I have reviewed the following: ? ?Past Medical History:  ?Diagnosis Date  ? Aortic atherosclerosis ([place])   ? Arthritis   ? Atrial fibrillation ([place])   ? a.) CHA2DS2-VASc = 6 (age x 2, sex, CHF, HTN, aortic plaque). b.) rate/rhythm maintained on oral  metoprolol succinate; chronically anticoagulated using dose reduced rivaroxaban d/t CKD.  ? Basal cell carcinoma   ? CHF (congestive heart failure) ([place]) 10/26/2014  ? a.)  TTE 10/26/2014: EF 45%; mild AR/PR; moderate MR/TR. b.) TTE 12/03/2016 mild LV dysfunction with LVH; EF 40%; global HK; severe LA and moderate RA enlargement; mild AR/PR, moderate MR/TR. c.)  TTE 01/25/2020: EF 45%; global HK; moderate LAE and mild RA enlargement; mild RV enlargement; trivial PR, mild AR, moderate to severe MR, moderate TR.  ? CKD (chronic kidney disease), stage III ([place])   ? Coronary artery calcification seen on CT scan   ? Dementia ([place])   ? a.) on donepezil + apoaequorin  ? Diverticulosis   ? DOE (dyspnea on exertion)   ? Fatty infiltration of liver   ? Full dentures   ? GERD (gastroesophageal reflux disease)   ? Hyperlipidemia   ? Hypertension   ? Hypothyroidism   ? Jaw fracture ([place]) 02/1995  ? a.) BILATERAL subcondylar fractures  ? Long term current use of anticoagulant   ? a.) rivaroxaban; dose reduced d/t CKD  ? Miscarriage   ? OSA (obstructive sleep apnea)   ? a.) does not require nocturnal PAP therapy  ? Osteoporosis   ? Pulmonary nodules   ? Thoracic aortic aneurysm (TAA) ([place]) 12/14/2015  ? a.) CT 12/14/2015: measured 4.2 cm. b.) CT 11/28/2016: not appreciated by radiologist, however CT reviewed by vascular and 4.2-4.3 cm aneurysmal dilitation noted.  ?  Thyroid cyst   ? ?Past Surgical History:  ?Procedure Laterality Date  ? CATARACT EXTRACTION Right   ? CHOLECYSTECTOMY  2014  ? COLONOSCOPY    ? JOINT REPLACEMENT Bilateral 2003  ? knees  ? TOTAL HIP ARTHROPLASTY Right 10/17/2021  ? Procedure: TOTAL HIP ARTHROPLASTY ANTERIOR APPROACH;  Surgeon: Hessie Knows, MD;  Location: [facility] ORS;  Service: Orthopedics;  Laterality: Right;  ? ?Family History  ?Problem Relation Age of Onset  ? Heart disease Mother   ? Hypertension Mother   ? Heart disease Father   ? Hypertension Father   ? Arthritis Sister   ? Cancer Sister   ? Diabetes  Sister   ? Heart disease Sister   ? Breast cancer Sister 14  ? Diabetes Brother   ? Heart disease Brother   ? Hypertension Brother   ? ?Social History  ? ?Socioeconomic History  ? Marital status: Married  ?  Spouse name: Charlotte Crumb  ? Number of children: Not on file  ? Years of education: Not on file  ? Highest education level: Some college, no degree  ?Occupational History  ? Occupation: retired  ?Tobacco Use  ? Smoking status: Former  ?  Types: Cigarettes  ?  Start date: 02/07/1955  ?  Quit date: 02/08/1955  ?  Years since quitting: 66.8  ? Smokeless tobacco: Never  ?Vaping Use  ? Vaping Use: Never used  ?Substance and Sexual Activity  ? Alcohol use: No  ?  Alcohol/week: 0.0 standard drinks  ? Drug use: No  ? Sexual activity: Not on file  ?Other Topics Concern  ? Not on file  ?Social History Narrative  ? Not on file  ? ?Social Determinants of Health  ? ?Financial Resource Strain: Not on file  ?Food Insecurity: Not on file  ?Transportation Needs: Not on file  ?Physical Activity: Not on file  ?Stress: Not on file  ?Social Connections: Not on file  ? ?Allergies  ?Allergen Reactions  ? Morphine And Related Nausea And Vomiting  ?  Diarrhea  ? Buprenorphine Hcl Nausea And Vomiting  ?  Diarrhea  ? Codeine Nausea Only  ?  GI upset ?  ? Desipramine Nausea Only  ? Fosamax [Alendronate] Rash  ? ? ?Medications  ? ?Medications Prior to Admission  ?Medication Sig Dispense Refill Last Dose  ? Apoaequorin (PREVAGEN) 10 MG CAPS Take 10 mg by mouth daily.   11/23/2021  ? atorvastatin (LIPITOR) 40 MG tablet Take 1 tablet (40 mg total) by mouth at bedtime. 90 tablet 0 11/23/2021  ? erythromycin ophthalmic ointment Place 1 application into the left eye in the morning and at bedtime.   11/23/2021 at 1900  ? latanoprost (XALATAN) 0.005 % ophthalmic solution Place 1 drop into both eyes at bedtime.   11/23/2021 at 1900  ? levothyroxine (SYNTHROID) 88 MCG tablet Take 1 tablet (88 mcg total) by mouth daily before breakfast. 90 tablet 0 11/23/2021  ?  metoprolol succinate (TOPROL-XL) 100 MG 24 hr tablet Take 100 mg by mouth daily.   11/23/2021 at 0900  ? vitamin B-12 (CYANOCOBALAMIN) 1000 MCG tablet Take 1,000 mcg by mouth daily.   11/23/2021 at 0900  ? XARELTO 15 MG TABS tablet Take 15 mg by mouth daily.   11/23/2021 at 1900  ? acetaminophen (TYLENOL) 500 MG tablet Take 2 tablets (1,000 mg total) by mouth every 6 (six) hours. 30 tablet 0 prn at unknown  ? docusate sodium (COLACE) 100 MG capsule Take 1 capsule (100 mg total) by mouth  2 (two) times daily. 10 capsule 0 prn at unknown  ? donepezil (ARICEPT) 5 MG tablet Take 5 mg by mouth at bedtime. (Patient not taking: Reported on 11/24/2021)   Not Taking  ? ferrous [id number]-P29-[id number] C-folic acid (TRINSICON / FOLTRIN) capsule Take 1 capsule by mouth 2 (two) times daily after a meal. (Patient not taking: Reported on 11/24/2021) 20 capsule 0 Not Taking  ? ferrous sulfate 325 (65 FE) MG EC tablet Take 325 mg by mouth 2 (two) times a week. (Patient not taking: Reported on 11/24/2021)   Not Taking  ? HYDROcodone-acetaminophen (NORCO) 7.5-325 MG tablet Take 0.5-1 tablets by mouth every 4 (four) hours as needed for severe pain (pain score 7-10). (Patient not taking: Reported on 11/24/2021) 30 tablet 0 Not Taking  ? methocarbamol (ROBAXIN) 500 MG tablet Take 1 tablet (500 mg total) by mouth every 6 (six) hours as needed for muscle spasms. 30 tablet 0 prn at unknown  ? ondansetron (ZOFRAN) 4 MG tablet Take 1 tablet (4 mg total) by mouth every 6 (six) hours as needed for nausea. 20 tablet 0 prn at unknown  ? polyethylene glycol (MIRALAX / GLYCOLAX) 17 g packet Take 17 g by mouth daily as needed for mild constipation. 14 each 0 prn at unknown  ? valsartan-hydrochlorothiazide (DIOVAN-HCT) 160-25 MG tablet Take 1 tablet by mouth daily. 90 tablet 0   ?  ? ? ?Current Facility-Administered Medications:  ?  0.9 %  sodium chloride infusion, , Intravenous, PRN, Sidney Ace, MD, Stopped at 11/26/21 1844 ?  acetaminophen (TYLENOL) tablet  650 mg, 650 mg, Oral, Q6H PRN, Ivor Costa, MD, 650 mg at 11/24/21 2156 ?  albuterol (PROVENTIL) (2.5 MG/3ML) 0.083% nebulizer solution 2.5 mg, 2.5 mg, Inhalation, Q4H PRN, Ivor Costa, MD ?  Chlorhexidine Gluco

## 2021-12-01 NOTE — Progress Notes (Signed)
I went to patient's room today to see if family was present and if so to answer any further questions. There was no family at bedside. Please see neurology consult note from yesterday evening for findings and content of my discussion with husband about her encephalopathy being 2/2 infection and delirium overlying baseline dementia. No further specific neurologic workup indicated at this time. Neurology to sign off, but please re-engage if additional neurologic concerns arise. ? ?Su Monks, MD ?[facility] ?[phone] ? ?If 7pm- 7am, please page neurology on call as listed in [place]. ? ?

## 2021-12-02 ENCOUNTER — Inpatient Hospital Stay: Payer: Medicare HMO

## 2021-12-02 DIAGNOSIS — I5033 Acute on chronic diastolic (congestive) heart failure: Secondary | ICD-10-CM | POA: Diagnosis not present

## 2021-12-02 DIAGNOSIS — Z7189 Other specified counseling: Secondary | ICD-10-CM | POA: Diagnosis not present

## 2021-12-02 LAB — CBC
HCT: 33.8 % — ABNORMAL LOW (ref 36.0–46.0)
Hemoglobin: 10.5 g/dL — ABNORMAL LOW (ref 12.0–15.0)
MCH: 29.4 pg (ref 26.0–34.0)
MCHC: 31.1 g/dL (ref 30.0–36.0)
MCV: 94.7 fL (ref 80.0–100.0)
Platelets: 110 10*3/uL — ABNORMAL LOW (ref 150–400)
RBC: 3.57 MIL/uL — ABNORMAL LOW (ref 3.87–5.11)
RDW: 16.7 % — ABNORMAL HIGH (ref 11.5–15.5)
WBC: 10.3 10*3/uL (ref 4.0–10.5)
nRBC: 0 % (ref 0.0–0.2)

## 2021-12-02 LAB — BASIC METABOLIC PANEL
Anion gap: 8 (ref 5–15)
BUN: 15 mg/dL (ref 8–23)
CO2: 26 mmol/L (ref 22–32)
Calcium: 8.5 mg/dL — ABNORMAL LOW (ref 8.9–10.3)
Chloride: 105 mmol/L (ref 98–111)
Creatinine, Ser: 0.84 mg/dL (ref 0.44–1.00)
GFR, Estimated: >60 mL/min (ref >60)
Glucose, Bld: 102 mg/dL — ABNORMAL HIGH (ref 70–99)
Potassium: 2.8 mmol/L — ABNORMAL LOW (ref 3.5–5.1)
Sodium: 139 mmol/L (ref 135–145)

## 2021-12-02 LAB — MAGNESIUM: Magnesium: 1.5 mg/dL — ABNORMAL LOW (ref 1.7–2.4)

## 2021-12-02 LAB — GLUCOSE, CAPILLARY
Glucose-Capillary: 104 mg/dL — ABNORMAL HIGH (ref 70–99)
Glucose-Capillary: 104 mg/dL — ABNORMAL HIGH (ref 70–99)
Glucose-Capillary: 134 mg/dL — ABNORMAL HIGH (ref 70–99)
Glucose-Capillary: 141 mg/dL — ABNORMAL HIGH (ref 70–99)

## 2021-12-02 LAB — PHOSPHORUS: Phosphorus: 1.6 mg/dL — ABNORMAL LOW (ref 2.5–4.6)

## 2021-12-02 MED ORDER — SODIUM CHLORIDE 0.9 % IV SOLN
1.0000 g | Freq: Three times a day (TID) | INTRAVENOUS | Status: DC
  Administered 2021-12-02 – 2021-12-05 (×9): 1 g via INTRAVENOUS
  Filled 2021-12-02 (×2): qty 20
  Filled 2021-12-02: qty 1
  Filled 2021-12-02 (×8): qty 20

## 2021-12-02 MED ORDER — POTASSIUM CHLORIDE 10 MEQ/100ML IV SOLN
10.0000 meq | INTRAVENOUS | Status: AC
  Administered 2021-12-02 (×5): 10 meq via INTRAVENOUS
  Filled 2021-12-02 (×6): qty 100

## 2021-12-02 MED ORDER — OSMOLITE 1.2 CAL PO LIQD
1000.0000 mL | ORAL | Status: DC
  Administered 2021-12-02 – 2021-12-04 (×3): 1000 mL

## 2021-12-02 MED ORDER — VITAL HIGH PROTEIN PO LIQD: 1000.0000 mL | ORAL | Status: DC

## 2021-12-02 MED ORDER — POTASSIUM CHLORIDE 10 MEQ/100ML IV SOLN
10.0000 meq | Freq: Once | INTRAVENOUS | Status: AC
  Administered 2021-12-02: 10 meq via INTRAVENOUS
  Filled 2021-12-02: qty 100

## 2021-12-02 MED ORDER — PROSOURCE TF PO LIQD
45.0000 mL | Freq: Every day | ORAL | Status: DC
  Administered 2021-12-02 – 2021-12-04 (×3): 45 mL
  Filled 2021-12-02: qty 45

## 2021-12-02 NOTE — Progress Notes (Signed)
Physical Therapy Treatment ?Patient Details ?Name: Brittney Ramirez ?MRN: [id number] ?DOB: [birth year] ?Today's Date: 12/02/2021 ? ? ?History of Present Illness Pt is an 81 y/o F who was admitted after presenting with SOB & AMS. Pt admitted for tx of acute on chronic diastolic CHF. Pt had rapid response called on 4/12 2/2 a-fib with RVR HR & pt was transferred to ICU & placed on cardizem drip. MRI showed small remote lacunar infarct of R thalamus. Pt was recently hospitalized from 3/2-3/4 for R hip fx. PMH: hypothyroidism, OSA not on CPAP, CAD, CKD 3, CHF, a-fib on xarelto, dementia, iron deficiency anemia ? ?  ?PT Comments  ? ? Pt seen for PT tx with co-tx with OT for pt & therapists safety. Pt continues to demonstrate impaired cognition & alertness. Pt requires total assist +2 for all bed mobility but sits EOB ~15 minutes with CGA<>max assist. Pt with poor righting reactions & does not attempt to correct posterior lean. Pt cries out in pain with all end range PROM to BUE/BLE joints. Pt left in bed in chair position with family present. ?   ?Recommendations for follow up therapy are one component of a multi-disciplinary discharge planning process, led by the attending physician.  Recommendations may be updated based on patient status, additional functional criteria and insurance authorization. ? ?Follow Up Recommendations ? Skilled nursing-short term rehab (<3 hours/day) ?  ?  ?Assistance Recommended at Discharge Frequent or constant Supervision/Assistance  ?Patient can return home with the following Two people to help with walking and/or transfers;Two people to help with bathing/dressing/bathroom;Direct supervision/assist for medications management;Help with stairs or ramp for entrance;Assistance with feeding;Assist for transportation;Assistance with cooking/housework;Direct supervision/assist for financial management ?  ?Equipment Recommendations ? Hospital bed;Wheelchair (measurements PT);Wheelchair cushion  (measurements PT) (hoyer lift & sling)  ?  ?Recommendations for Other Services   ? ? ?  ?Precautions / Restrictions Precautions ?Precautions: Fall ?Restrictions ?Weight Bearing Restrictions: No  ?  ? ?Mobility ? Bed Mobility ?Overal bed mobility: Needs Assistance ?Bed Mobility: Supine to Sit, Sit to Supine, Rolling ?Rolling: Total assist, +2 for physical assistance, +2 for safety/equipment ?  ?Supine to sit: Total assist, +2 for physical assistance, HOB elevated ?Sit to supine: Total assist, +2 for safety/equipment, +2 for physical assistance ?  ?  ?  ? ?Transfers ?  ?  ?  ?  ?  ?  ?  ?  ?  ?  ?  ? ?Ambulation/Gait ?  ?  ?  ?  ?  ?  ?  ?  ? ? ?Stairs ?  ?  ?  ?  ?  ? ? ?Wheelchair Mobility ?  ? ?Modified Rankin (Stroke Patients Only) ?  ? ? ?  ?Balance Overall balance assessment: Needs assistance ?Sitting-balance support: Feet supported, Bilateral upper extremity supported ?Sitting balance-Leahy Scale: Poor ?Sitting balance - Comments: intermittently CGA however primarily min<>max assist; when pt begins to lean posteriorly pt demonstrates absent righting reactions & does not attempt to correct herself; attempted to engage pt in reaching tasks to focus on sitting balance but pt unable to follow simple commands despite extra time/cuing ?  ?  ?  ?  ?  ?  ?  ?  ?  ?  ?  ?  ?  ?  ?  ?  ? ?  ?Cognition Arousal/Alertness: Awake/alert ?Behavior During Therapy: Anxious ?Overall Cognitive Status: Impaired/Different from baseline ?Area of Impairment: Orientation, Attention, Following commands, Awareness, Safety/judgement, Memory, Problem solving ?  ?  ?  ?  ?  ?  ?  ?  ?  Orientation Level: Disoriented to, Person, Place, Time, Situation ?Current Attention Level: Focused ?Memory: Decreased recall of precautions, Decreased short-term memory ?Following Commands: Follows one step commands inconsistently ?Safety/Judgement: Decreased awareness of safety, Decreased awareness of deficits ?Awareness: Intellectual ?Problem Solving:  Decreased initiation, Slow processing, Difficulty sequencing, Requires verbal cues, Requires tactile cues ?General Comments: Pt able to verbalize a few more words/phrases during today's session but continues to demonstrate significantly impaired cognition & ability to follow commands. ?  ?  ? ?  ?Exercises   ? ?  ?General Comments   ?  ?  ? ?Pertinent Vitals/Pain Pain Assessment ?Pain Assessment: Faces ?Faces Pain Scale: Hurts worst ?Pain Location: BUE/BLE with PROM at end ranges ?Pain Descriptors / Indicators: Crying, Grimacing, Guarding ?Pain Intervention(s): Repositioned, Limited activity within patient's tolerance  ? ? ?Home Living   ?  ?  ?  ?  ?  ?  ?  ?  ?  ?   ?  ?Prior Function    ?  ?  ?   ? ?PT Goals (current goals can now be found in the care plan section) Acute Rehab PT Goals ?Patient Stated Goal: get better ?PT Goal Formulation: With family ?Time For Goal Achievement: 17-Dec-2021 ?Potential to Achieve Goals: Poor ?Progress towards PT goals: PT to reassess next treatment ? ?  ?Frequency ? ? ? Min 2X/week ? ? ? ?  ?PT Plan Current plan remains appropriate (c/o pain)  ? ? ?Co-evaluation PT/OT/SLP Co-Evaluation/Treatment: Yes ?Reason for Co-Treatment: Complexity of the patient's impairments (multi-system involvement);Necessary to address cognition/behavior during functional activity;For patient/therapist safety ?PT goals addressed during session: Mobility/safety with mobility;Balance;Strengthening/ROM ?OT goals addressed during session: ADL's and self-care ?  ? ?  ?AM-PAC PT "6 Clicks" Mobility   ?Outcome Measure ? Help needed turning from your back to your side while in a flat bed without using bedrails?: Total ?Help needed moving from lying on your back to sitting on the side of a flat bed without using bedrails?: Total ?Help needed moving to and from a bed to a chair (including a wheelchair)?: Total ?Help needed standing up from a chair using your arms (e.g., wheelchair or bedside chair)?: Total ?Help  needed to walk in hospital room?: Total ?Help needed climbing 3-5 steps with a railing? : Total ?6 Click Score: 6 ? ?  ?End of Session Equipment Utilized During Treatment: Oxygen ?Activity Tolerance: Patient limited by fatigue;Patient limited by pain (limited by impaired cognition) ?Patient left: in bed;with call bell/phone within reach;with bed alarm set;with family/visitor present (4 rails up with family approval, in bed in chair position) ?Nurse Communication: Mobility status ?PT Visit Diagnosis: Muscle weakness (generalized) (M62.81);Difficulty in walking, not elsewhere classified (R26.2);Unsteadiness on feet (R26.81);Pain ?Pain - part of body:  (generalized) ?  ? ? ?Time: 8264-1583 ?PT Time Calculation (min) (ACUTE ONLY): 32 min ? ?Charges:  $Therapeutic Activity: 8-22 mins          ?          ? ?Lavone Nian, PT, DPT ?12/02/21, 11:50 AM ? ?Waunita Schooner ?12/02/2021, 11:49 AM ? ?

## 2021-12-02 NOTE — Progress Notes (Signed)
Occupational Therapy Treatment ?Patient Details ?Name: Brittney Ramirez ?MRN: [id number] ?DOB: Sep 26, 1940 ?Today's Date: 12/02/2021 ? ? ?History of present illness Pt is an 81 y/o F who was admitted after presenting with SOB & AMS. Pt admitted for tx of acute on chronic diastolic CHF. Pt had rapid response called on 4/12 2/2 a-fib with RVR HR & pt was transferred to ICU & placed on cardizem drip. MRI showed small remote lacunar infarct of R thalamus. Pt was recently hospitalized from 3/2-3/4 for R hip fx. PMH: hypothyroidism, OSA not on CPAP, CAD, CKD 3, CHF, a-fib on xarelto, dementia, iron deficiency anemia ?  ?OT comments ? Brittney Ramirez was seen for OT treatment on this date. Upon arrival to room pt reclined in bed, family at bedside, agreeable to tx and eager for oral care. Pt requires MAX A x2 sup<>sit. MAX A x2 oral care sitting EOB, +2 for static sitting balance. Attempted AROM and reaching activity however pt with notable difficulty sustaining attention and following commands, limited by pain "everywhere" to touch and PROM. Tolerated seated PROM of BUE with face showing 10/10 pain. Returned to bed in chair position and family educated on PROM and delirium prevention precautions. Pt making progress toward goals. Will continue to follow POC. Discharge recommendation remains appropriate.  ?  ? ?Recommendations for follow up therapy are one component of a multi-disciplinary discharge planning process, led by the attending physician.  Recommendations may be updated based on patient status, additional functional criteria and insurance authorization. ?   ?Follow Up Recommendations ? Skilled nursing-short term rehab (<3 hours/day)  ?  ?Assistance Recommended at Discharge Frequent or constant Supervision/Assistance  ?Patient can return home with the following ? Two people to help with walking and/or transfers;Two people to help with bathing/dressing/bathroom;Help with stairs or ramp for entrance ?  ?Equipment Recommendations ?  Hospital bed  ?  ?Recommendations for Other Services   ? ?  ?Precautions / Restrictions Precautions ?Precautions: Fall ?Restrictions ?Weight Bearing Restrictions: No  ? ? ?  ? ?Mobility Bed Mobility ?Overal bed mobility: Needs Assistance ?Bed Mobility: Supine to Sit, Sit to Supine, Rolling ?  ?  ?Supine to sit: Max assist, +2 for physical assistance, HOB elevated ?Sit to supine: Max assist, +2 for physical assistance ?  ?  ?  ? ?Transfers ?  ?  ?  ?  ?  ?  ?  ?  ?  ?General transfer comment: unsafe to attempt ?  ?  ?Balance Overall balance assessment: Needs assistance ?Sitting-balance support: Feet supported, Bilateral upper extremity supported ?Sitting balance-Leahy Scale: Poor ?  ?  ?  ?  ?  ?  ?  ?  ?  ?  ?  ?  ?  ?  ?  ?  ?   ? ?ADL either performed or assessed with clinical judgement  ? ?ADL Overall ADL's : Needs assistance/impaired ?  ?  ?  ?  ?  ?  ?  ?  ?  ?  ?  ?  ?  ?  ?  ?  ?  ?  ?  ?General ADL Comments: MAX A x2 oral care sitting EOB, +2 for balance ?  ? ? ? ?Cognition Arousal/Alertness: Awake/alert ?Behavior During Therapy: Anxious ?Overall Cognitive Status: Impaired/Different from baseline ?Area of Impairment: Orientation, Attention, Following commands, Awareness, Safety/judgement, Memory, Problem solving ?  ?  ?  ?  ?  ?  ?  ?  ?Orientation Level: Disoriented to, Person, Place, Time, Situation ?Current  Attention Level: Focused ?Memory: Decreased recall of precautions, Decreased short-term memory ?Following Commands: Follows one step commands inconsistently, Follows one step commands with increased time ?Safety/Judgement: Decreased awareness of safety, Decreased awareness of deficits ?  ?Problem Solving: Decreased initiation, Slow processing, Difficulty sequencing, Requires verbal cues, Requires tactile cues ?  ?  ?  ?   ?   ?   ?   ? ? ?Pertinent Vitals/ Pain       Pain Assessment ?Pain Assessment: Faces ?Faces Pain Scale: Hurts worst ?Pain Location: BUE/BLE with PROM at end ranges ?Pain Descriptors  / Indicators: Crying, Grimacing, Guarding ?Pain Intervention(s): Limited activity within patient's tolerance, Repositioned ? ?   ?   ? ?Frequency ? Min 2X/week  ? ? ? ? ?  ?Progress Toward Goals ? ?OT Goals(current goals can now be found in the care plan section) ? Progress towards OT goals: Progressing toward goals ? ?Acute Rehab OT Goals ?Patient Stated Goal: to improve pain ?OT Goal Formulation: With family ?Time For Goal Achievement: 07-Jan-2022 ?Potential to Achieve Goals: Fair ?ADL Goals ?Pt Will Perform Eating: with set-up;with supervision;sitting ?Pt Will Perform Grooming: with modified independence;bed level ?Pt Will Transfer to Toilet: with mod assist;squat pivot transfer;bedside commode  ?Plan Discharge plan remains appropriate;Frequency remains appropriate   ? ?Co-evaluation ? ? ? PT/OT/SLP Co-Evaluation/Treatment: Yes ?Reason for Co-Treatment: For patient/therapist safety;To address functional/ADL transfers;Necessary to address cognition/behavior during functional activity ?PT goals addressed during session: Mobility/safety with mobility;Balance ?OT goals addressed during session: ADL's and self-care ?  ? ?  ?AM-PAC OT "6 Clicks" Daily Activity     ?Outcome Measure ? ? Help from another person eating meals?: A Little ?Help from another person taking care of personal grooming?: A Lot ?Help from another person toileting, which includes using toliet, bedpan, or urinal?: A Lot ?Help from another person bathing (including washing, rinsing, drying)?: A Lot ?Help from another person to put on and taking off regular upper body clothing?: A Lot ?Help from another person to put on and taking off regular lower body clothing?: A Lot ?6 Click Score: 13 ? ?  ?End of Session Equipment Utilized During Treatment: Oxygen ? ?OT Visit Diagnosis: Other abnormalities of gait and mobility (R26.89);Muscle weakness (generalized) (M62.81) ?  ?Activity Tolerance Patient limited by fatigue ?  ?Patient Left in bed;with call bell/phone  within reach;with bed alarm set;with family/visitor present ?  ?Nurse Communication Mobility status ?  ? ?   ? ?Time: 5188-4166 ?OT Time Calculation (min): 33 min ? ?Charges: OT General Charges ?$OT Visit: 1 Visit ?OT Treatments ?$Self Care/Home Management : 8-22 mins ? ?Dessie Coma, M.S. OTR/L  ?12/02/21, 10:54 AM  ?ascom [phone] ? ?

## 2021-12-02 NOTE — Progress Notes (Signed)
Initial Nutrition Assessment ? ?DOCUMENTATION CODES:  ? ?Obesity unspecified ? ?INTERVENTION:  ? ?Initiate Osmolite 1.2 @ 20 ml/hr via NGT and increase by 10 ml every 8 hours to goal rate of 60 ml/hr.  ? ?45 ml Prosource daily.   ? ?Tube feeding regimen provides 1768 kcal (100% of needs), 91 grams of protein, and 1181 ml of H2O.   ? ?-Monitor Mg, K, and Phos daily and replete as needed secondary to high refeeding risk ? ?NUTRITION DIAGNOSIS:  ? ?Inadequate oral intake related to inability to eat as evidenced by NPO status. ? ?GOAL:  ? ?Patient will meet greater than or equal to 90% of their needs ? ?MONITOR:  ? ?Diet advancement, Labs, Weight trends, TF tolerance, Skin, I & O's ? ?REASON FOR ASSESSMENT:  ? ?Consult ?Enteral/tube feeding initiation and management ? ?ASSESSMENT:  ? ?Brittney Ramirez is a 81 y.o. female with medical history significant of hypertension, hyperlipidemia, GERD, hypothyroidism, OSA not on CPAP, CAD, CKD-3, CHF, atrial fibrillation on Xarelto, dementia, iron deficiency anemia, who presents with shortness of breath and altered mental status. ? ?Pt admitted with CHF.  ? ?Reviewed I/O's: +310 ml x 24 hours and +1.7 L since admission ? ?UOP: 1.3 L x 24 hours ? ?Spoke with pt and multiple family members at bedside (husband, sisters, and grandson). Family reports that pt has experienced a general decline in health after having hip surgery about 6 weeks ago. She has been weak, but trying to progress to prior level of function. Sister notes that pt had a very bad cough when speaking to her PTA and it was difficult for her to communicate and catch her breath. Pt has had mental status changes over the past 8-9 days. Intake has been very poor. The last time pt ate was last Thursday, where she consumed a few bites and sips at meals. Family reports that she was very lethargic over the weekend, however, she is a little more alert this morning. Sister says that she is now able to open her eyes and recognize  her. Observed multiple family members interact with pt- pt would turn to them when spoken to and open her mouth to respond to questions. Pt with minimally interactive with this RD; do not think pt would be able to pass a swallow evaluation or take adequate PO's if placed on a diet.    ? ?Reviewed wt hx; no wt loss noted over the past year.  ? ?Spoke with family regarding options on how to address nutrition. Husband is in agreement with NGT; he would like to see if initiation of nutrition will improve mental status.  ? ?Case discussed with RN and MD. NGT placed by RN later today. Will start TF.  ? ?Medications reviewed and include vitamin B-12, cardizem, and lactated ringers infusion @ 50 ml/hr.  ? ?No results found for: HGBA1C PTA DM medications are none.  ? ?Labs reviewed: K: 2.8, CBGS: 111-112 (inpatient orders for glycemic control are none).   ? ?NUTRITION - FOCUSED PHYSICAL EXAM: ? ?Flowsheet Row Most Recent Value  ?Orbital Region No depletion  ?Upper Arm Region No depletion  ?Thoracic and Lumbar Region No depletion  ?Buccal Region No depletion  ?Temple Region No depletion  ?Clavicle Bone Region No depletion  ?Clavicle and Acromion Bone Region No depletion  ?Scapular Bone Region No depletion  ?Dorsal Hand No depletion  ?Patellar Region No depletion  ?Anterior Thigh Region No depletion  ?Posterior Calf Region No depletion  ?Edema (RD Assessment) Mild  ?  Hair Reviewed  ?Eyes Reviewed  ?Mouth Reviewed  ?Skin Reviewed  ?Nails Reviewed  ? ?  ? ? ?Diet Order:   ?Diet Order   ? ?       ?  Diet 2 gram sodium Room service appropriate? Yes; Fluid consistency: Thin  Diet effective now       ?  ? ?  ?  ? ?  ? ? ?EDUCATION NEEDS:  ? ?Education needs have been addressed ? ?Skin:  Skin Assessment: Reviewed RN Assessment ? ?Last BM:  12/01/21 ? ?Height:  ? ?Ht Readings from Last 1 Encounters:  ?11/24/21 '5\' 6"'$  (1.676 m)  ? ? ?Weight:  ? ?Wt Readings from Last 1 Encounters:  ?12/02/21 87.4 kg  ? ? ?Ideal Body Weight:  59.1  kg ? ?BMI:  Body mass index is 31.1 kg/m?. ? ?Estimated Nutritional Needs:  ? ?Kcal:  1750-1950 ? ?Protein:  90-105 grams ? ?Fluid:  > 1.7 L ? ? ? ?Loistine Chance, RD, LDN, CDCES ?Registered Dietitian II ?Certified Diabetes Care and Education Specialist ?Please refer to [facility] for RD and/or RD on-call/weekend/after hours pager  ?

## 2021-12-02 NOTE — Plan of Care (Signed)
Full note to follow. Conversation with family regarding care planning; they would like to talk as a family.  ? ? ?Family meeting at 10:00 at bedside tomorrow morning.  ?

## 2021-12-02 NOTE — Progress Notes (Signed)
?PROGRESS NOTE ? ? ? ?Brittney Ramirez  MAU:[id number] DOB: 11-Sep-1940 DOA: 11/24/2021 ?PCP: Adin Hector, MD  ? ? ?Brief Narrative:  ?hypothyroidism, OSA not on CPAP, CAD, CKD-3, CHF, atrial fibrillation on Xarelto, dementia, iron deficiency anemia, who presents with shortness of breath and altered mental status. ?  ?Patient was recently hospitalized from 3/2 - 3/4 due to right hip fracture.  Patient is s/p of right hip surgery. She finished course of rehab.  Per her son and husband at the bedside, in the past 2 days, patient has shortness breath which has been progressively worsening.  No chest pain, fever or chills.  Has mild dry cough.  Patient has history of dementia, but most of the time patient recognizes family members, is oriented to time and place. Patient is more confused today. ?  ?Pulmonary embolism ruled out.  Presentation consistent with acute decompensated heart failure.  Started on IV diuretics.  Atrial fibrillation with uncontrolled rate ? Volume status improved however kidney function worsen and dehydration noted on 4/11 ? ? ?4/12 patient had rapid response earlier this morning with A-fib RVR heart rate in 130s to 140s.  Patient was transferred to stepdown started on Cardizem drip. ?This am pt confused, not following commands. Has not taken po intake yet. Not able to give some of her po meds . MRI this am with small remote lacunar infarct of rt thalamus. ?Tele afib rate 80's to 90's on  cardizem gtt. ? ? ?4/13 awake more alert. Recognizes husband, answers questions. Ate 50% of her breakfast. Took meds crushed.  ?4/14 confused this am . Husband at bedside. Refusing eating, or taking meds. ?4/15 this am was told family at bedside with multiple questions and, being aggressive. Saw pt and had long discussion with family about pts status. Tried to explain to them about dementia (which husband kept denying has this ), delirium, inpt delirium and now has asp. Pna. Discussed giving ivf, but have to be  cautious due to her chf.. Family aggressive. Told they were not updated. Explained to them , pts husband at bedside every morning and he has been personally updated, husband also agreed with this, Told family if they would all like to get updated, they can assign someone for [place] to talk to daily, but otherwise will continue updating pts husband at his request. Nsg was also at bedside, tried to answer questions about the same issues as I discussed, tried to help family understand about her medical condition. I also discussed with them, that once her MS at more at baseline, will have SPL evaluate her swallowing. Discussed with family if she continues not to eat , then need to decided on NGT/feeding tube placement. They finally calm down. I asked Dr. Quinn Axe to talk to family about her dementia and delirium too. ? ?4/16 moaning this am. Eyes open.  ?4/17 working with PT this am. Moaning, but opens eyes. Doesn't respond. More awake today . ? ?Consultants:  ?Cardiology ?neurology ? ? ? ?Procedures: CT , MRI ? ?Antimicrobials:  ?Rocephin, azithromycin ? ? ?Subjective: ?Confused ? ? ?Objective: ?Vitals:  ? 12/02/21 0347 12/02/21 0429 12/02/21 0827 12/02/21 1304  ?BP: 127/69  131/80 111/74  ?Pulse: (!) 107  95 (!) 107  ?Resp: '20  20 16  '$ ?Temp: 98.7 ?F (37.1 ?C)  98.7 ?F (37.1 ?C) 98.5 ?F (36.9 ?C)  ?TempSrc: Axillary     ?SpO2: 96%  95% 97%  ?Weight:  87.4 kg    ?Height:      ? ? ?  Intake/Output Summary (Last 24 hours) at 12/02/2021 1605 ?Last data filed at 12/02/2021 1500 ?Gross per 24 hour  ?Intake 1739.96 ml  ?Output 1250 ml  ?Net 489.96 ml  ? ?Filed Weights  ? 11/30/21 0500 12/01/21 0416 12/02/21 0429  ?Weight: 83.3 kg 85.3 kg 87.4 kg  ? ? ?Examination: ?Calm, NAD ?Cta with decrease bs.. no wheezing or crackles ?Reg-irregs1/s2 no gallop ?Soft benign +bs ?No edema ?Confused. Awake. ?Mood and affect appropriate in current setting  ? ? ? ?Data Reviewed: I have personally reviewed following labs and imaging  studies ? ?CBC: ?Recent Labs  ?Lab 11/26/21 ?0907 11/27/21 ?1123 11/28/21 ?2500 11/29/21 ?3704 11/30/21 ?0440 12/02/21 ?0617  ?WBC 11.2* 7.9 8.6 6.9 8.2 10.3  ?NEUTROABS 8.6*  --   --   --   --   --   ?HGB 11.2* 10.0* 9.5* 9.6* 10.8* 10.5*  ?HCT 37.9 33.1* 31.9* 31.8* 36.1 33.8*  ?MCV 100.3* 98.2 98.5 97.2 97.3 94.7  ?PLT 195 165 147* 126* 115* 110*  ? ?Basic Metabolic Panel: ?Recent Labs  ?Lab 11/26/21 ?0907 11/27/21 ?1123 11/28/21 ?8889 11/29/21 ?1694 11/30/21 ?0440 12/02/21 ?0617  ?NA 147* 147* 145 141  --  139  ?K 4.2 3.4* 3.1* 3.0* 3.9 2.8*  ?CL 109 111 111 107  --  105  ?CO2 20* '22 24 25  '$ --  26  ?GLUCOSE 101* 124* 141* 127*  --  102*  ?BUN 30* 47* 44* 30*  --  15  ?CREATININE 1.63* 2.08* 1.60* 1.09*  --  0.84  ?CALCIUM 9.3 8.6* 8.4* 8.4*  --  8.5*  ?MG 1.9  --   --   --   --   --   ? ?GFR: ?Estimated Creatinine Clearance: 57.5 mL/min (by C-G formula based on SCr of 0.84 mg/dL). ?Liver Function Tests: ?Recent Labs  ?Lab 11/27/21 ?1123 11/28/21 ?5038 11/29/21 ?8828 11/30/21 ?0440 12/01/21 ?0034  ?AST 1,407* 759* 353* 216* 101*  ?ALT 1,107* 811* 575* 494* 324*  ?ALKPHOS 102 97 89 95 100  ?BILITOT 1.4* 1.2 1.4* 1.6* 2.4*  ?PROT 6.3* 6.1* 5.7* 6.3* 6.4*  ?ALBUMIN 3.4* 3.4* 3.3* 3.4* 3.5  ? ?No results for input(s): LIPASE, AMYLASE in the last 168 hours. ?Recent Labs  ?Lab 11/27/21 ?1558  ?AMMONIA 26  ? ?Coagulation Profile: ?Recent Labs  ?Lab 11/28/21 ?9179 11/29/21 ?1648  ?INR 2.3* 1.8*  ? ?Cardiac Enzymes: ?No results for input(s): CKTOTAL, CKMB, CKMBINDEX, TROPONINI in the last 168 hours. ?BNP (last 3 results) ?No results for input(s): PROBNP in the last 8760 hours. ?HbA1C: ?No results for input(s): HGBA1C in the last 72 hours. ?CBG: ?Recent Labs  ?Lab 11/28/21 ?1505 11/29/21 ?0803 11/30/21 ?0756 12/01/21 ?6979 12/02/21 ?1018  ?GLUCAP 134* 122* 112* 111* 104*  ? ?Lipid Profile: ?No results for input(s): CHOL, HDL, LDLCALC, TRIG, CHOLHDL, LDLDIRECT in the last 72 hours. ?Thyroid Function Tests: ?No results for  input(s): TSH, T4TOTAL, FREET4, T3FREE, THYROIDAB in the last 72 hours. ?Anemia Panel: ?No results for input(s): VITAMINB12, FOLATE, FERRITIN, TIBC, IRON, RETICCTPCT in the last 72 hours. ?Sepsis Labs: ?No results for input(s): PROCALCITON, LATICACIDVEN in the last 168 hours. ? ? ?Recent Results (from the past 240 hour(s))  ?Blood Culture (routine x 2)     Status: None  ? Collection Time: 11/24/21 10:29 AM  ? Specimen: BLOOD  ?Result Value Ref Range Status  ? Specimen Description BLOOD LEFT ANTECUBITAL  Final  ? Special Requests   Final  ?  BOTTLES DRAWN AEROBIC AND ANAEROBIC Blood Culture adequate  volume  ? Culture   Final  ?  NO GROWTH 5 DAYS ?Performed at [facility], [address], [place], [place] [address] ?  ? Report Status 11/29/2021 FINAL  Final  ?Blood Culture (routine x 2)     Status: None  ? Collection Time: 11/24/21 10:29 AM  ? Specimen: BLOOD  ?Result Value Ref Range Status  ? Specimen Description BLOOD RIGHT ANTECUBITAL  Final  ? Special Requests   Final  ?  BOTTLES DRAWN AEROBIC AND ANAEROBIC Blood Culture adequate volume  ? Culture   Final  ?  NO GROWTH 5 DAYS ?Performed at [facility], [address], [place], [place] [address] ?  ? Report Status 11/29/2021 FINAL  Final  ?Resp Panel by RT-PCR (Flu A&B, Covid) Nasopharyngeal Swab     Status: None  ? Collection Time: 11/24/21 11:20 AM  ? Specimen: Nasopharyngeal Swab; Nasopharyngeal(NP) swabs in vial transport medium  ?Result Value Ref Range Status  ? SARS Coronavirus 2 by RT PCR NEGATIVE NEGATIVE Final  ?  Comment: (NOTE) ?SARS-CoV-2 target nucleic acids are NOT DETECTED. ? ?The SARS-CoV-2 RNA is generally detectable in upper respiratory ?specimens during the acute phase of infection. The lowest ?concentration of SARS-CoV-2 viral copies this assay can detect is ?138 copies/mL. A negative result does not preclude SARS-Cov-2 ?infection and should not be used as the sole basis for treatment or ?other patient management decisions.  A negative result may occur with  ?improper specimen collection/handling, submission of specimen other ?than nasopharyngeal swab, presence of viral mutation(s) within the ?areas targeted by this assay, and inadequate number of

## 2021-12-03 DIAGNOSIS — Z7189 Other specified counseling: Secondary | ICD-10-CM | POA: Diagnosis not present

## 2021-12-03 DIAGNOSIS — I5033 Acute on chronic diastolic (congestive) heart failure: Secondary | ICD-10-CM | POA: Diagnosis not present

## 2021-12-03 DIAGNOSIS — D696 Thrombocytopenia, unspecified: Secondary | ICD-10-CM

## 2021-12-03 LAB — COMPREHENSIVE METABOLIC PANEL
ALT: 149 U/L — ABNORMAL HIGH (ref 0–44)
AST: 34 U/L (ref 15–41)
Albumin: 2.7 g/dL — ABNORMAL LOW (ref 3.5–5.0)
Alkaline Phosphatase: 75 U/L (ref 38–126)
Anion gap: 7 (ref 5–15)
BUN: 18 mg/dL (ref 8–23)
CO2: 27 mmol/L (ref 22–32)
Calcium: 8.2 mg/dL — ABNORMAL LOW (ref 8.9–10.3)
Chloride: 104 mmol/L (ref 98–111)
Creatinine, Ser: 0.79 mg/dL (ref 0.44–1.00)
GFR, Estimated: >60 mL/min (ref >60)
Glucose, Bld: 143 mg/dL — ABNORMAL HIGH (ref 70–99)
Potassium: 3.5 mmol/L (ref 3.5–5.1)
Sodium: 138 mmol/L (ref 135–145)
Total Bilirubin: 1.3 mg/dL — ABNORMAL HIGH (ref 0.3–1.2)
Total Protein: 5.4 g/dL — ABNORMAL LOW (ref 6.5–8.1)

## 2021-12-03 LAB — PHOSPHORUS
Phosphorus: 1.4 mg/dL — ABNORMAL LOW (ref 2.5–4.6)
Phosphorus: 1.2 mg/dL — ABNORMAL LOW (ref 2.5–4.6)

## 2021-12-03 LAB — MAGNESIUM
Magnesium: 1.6 mg/dL — ABNORMAL LOW (ref 1.7–2.4)
Magnesium: 2.3 mg/dL (ref 1.7–2.4)

## 2021-12-03 LAB — GLUCOSE, CAPILLARY
Glucose-Capillary: 119 mg/dL — ABNORMAL HIGH (ref 70–99)
Glucose-Capillary: 122 mg/dL — ABNORMAL HIGH (ref 70–99)
Glucose-Capillary: 129 mg/dL — ABNORMAL HIGH (ref 70–99)
Glucose-Capillary: 134 mg/dL — ABNORMAL HIGH (ref 70–99)
Glucose-Capillary: 138 mg/dL — ABNORMAL HIGH (ref 70–99)
Glucose-Capillary: 161 mg/dL — ABNORMAL HIGH (ref 70–99)

## 2021-12-03 LAB — HEPARIN ANTI-XA: Heparin LMW: 1.21 IU/mL

## 2021-12-03 LAB — HEPARIN INDUCED PLATELET AB (HIT ANTIBODY): Heparin Induced Plt Ab: 0.075 OD (ref 0.000–0.400)

## 2021-12-03 MED ORDER — MAGNESIUM SULFATE 2 GM/50ML IV SOLN
2.0000 g | Freq: Once | INTRAVENOUS | Status: AC
  Administered 2021-12-03: 2 g via INTRAVENOUS
  Filled 2021-12-03: qty 50

## 2021-12-03 MED ORDER — K PHOS MONO-SOD PHOS DI & MONO 155-852-130 MG PO TABS
500.0000 mg | ORAL_TABLET | ORAL | Status: DC
  Administered 2021-12-03 – 2021-12-04 (×3): 500 mg via ORAL
  Filled 2021-12-03 (×4): qty 2

## 2021-12-03 MED ORDER — ENOXAPARIN SODIUM 80 MG/0.8ML IJ SOSY
65.0000 mg | PREFILLED_SYRINGE | Freq: Two times a day (BID) | INTRAMUSCULAR | Status: DC
  Administered 2021-12-03 – 2021-12-04 (×3): 65 mg via SUBCUTANEOUS
  Filled 2021-12-03 (×3): qty 0.8

## 2021-12-03 NOTE — Progress Notes (Signed)
Occupational Therapy Treatment ?Patient Details ?Name: Brittney Ramirez ?MRN: [id number] ?DOB: May 16, 1941 ?Today's Date: 12/03/2021 ? ? ?History of present illness Pt is an 81 y/o F who was admitted after presenting with SOB & AMS. Pt admitted for tx of acute on chronic diastolic CHF. Pt had rapid response called on 4/12 2/2 a-fib with RVR HR & pt was transferred to ICU & placed on cardizem drip. MRI showed small remote lacunar infarct of R thalamus. Pt was recently hospitalized from 3/2-3/4 for R hip fx. PMH: hypothyroidism, OSA not on CPAP, CAD, CKD 3, CHF, a-fib on xarelto, dementia, iron deficiency anemia ?  ?OT comments ? Pt seen for OT/PT co-treatment on this date. Upon arrival to room, pt resting in bed with eyes closed and 3 visitors present. Pt oriented to self and able to verbalize a few words during session, however eyes closed for majority of session. Pt required TOTAL A+2 for bed mobility, but was able to sit EOB requiring CGA<>MAX A for sitting balance. While seated EOB, pt engaged in x2 seated grooming tasks requiring MAX A (+2 for sitting balance). Pt is making good progress toward goals and continues to benefit from skilled OT services to maximize return to PLOF and minimize risk of future falls, injury, caregiver burden, and readmission. Will continue to follow POC. Discharge recommendation remains appropriate.    ? ?Recommendations for follow up therapy are one component of a multi-disciplinary discharge planning process, led by the attending physician.  Recommendations may be updated based on patient status, additional functional criteria and insurance authorization. ?   ?Follow Up Recommendations ? Skilled nursing-short term rehab (<3 hours/day)  ?  ?Assistance Recommended at Discharge Frequent or constant Supervision/Assistance  ?Patient can return home with the following ? Two people to help with walking and/or transfers;Two people to help with bathing/dressing/bathroom;Help with stairs or ramp for  entrance ?  ?Equipment Recommendations ? Hospital bed  ?  ?   ?Precautions / Restrictions Precautions ?Precautions: Fall ?Restrictions ?Weight Bearing Restrictions: No  ? ? ?  ? ?Mobility Bed Mobility ?Overal bed mobility: Needs Assistance ?Bed Mobility: Supine to Sit, Sit to Supine ?  ?  ?Supine to sit: Total assist, +2 for physical assistance, HOB elevated ?Sit to supine: Total assist, +2 for safety/equipment, +2 for physical assistance ?  ?  ?  ? ?Transfers ?  ?  ?  ?  ?  ?  ?  ?  ?  ?General transfer comment: unsafe to attempt ?  ?  ?Balance Overall balance assessment: Needs assistance ?Sitting-balance support: Feet supported, Bilateral upper extremity supported ?  ?Sitting balance - Comments: intermittently CGA however primarily min<>max assist ?  ?  ?  ?  ?  ?  ?  ?  ?  ?  ?  ?  ?  ?  ?  ?   ? ?ADL either performed or assessed with clinical judgement  ? ?ADL Overall ADL's : Needs assistance/impaired ?  ?  ?  ?  ?  ?  ?  ?  ?  ?  ?  ?  ?  ?  ?  ?  ?  ?  ?  ?General ADL Comments: MAX A x2 washing face and oral care sitting EOB, +2 for balance ?  ? ? ? ?Cognition Arousal/Alertness: Awake/alert ?Behavior During Therapy: Anxious ?Overall Cognitive Status: Impaired/Different from baseline ?Area of Impairment: Orientation, Attention, Following commands, Awareness, Safety/judgement, Memory, Problem solving ?  ?  ?  ?  ?  ?  ?  ?  ?  Orientation Level: Disoriented to, Place, Time, Situation ?Current Attention Level: Focused ?Memory: Decreased recall of precautions, Decreased short-term memory ?Following Commands: Follows one step commands inconsistently ?Safety/Judgement: Decreased awareness of safety, Decreased awareness of deficits ?Awareness: Intellectual ?Problem Solving: Decreased initiation, Slow processing, Difficulty sequencing, Requires verbal cues, Requires tactile cues ?General Comments: Pt able to verbalize a few words this date, however eyes closed for majority of session. ?  ?  ?   ?   ?   ?    ? ? ?Pertinent Vitals/ Pain       Pain Assessment ?Pain Assessment: PAINAD ?Breathing: occasional labored breathing, short period of hyperventilation ?Negative Vocalization: none ?Facial Expression: facial grimacing ?Body Language: tense, distressed pacing, fidgeting ?Consolability: no need to console ?PAINAD Score: 4 ?Pain Location: BUE/BLE with PROM at end ranges ?Pain Descriptors / Indicators: Grimacing, Guarding ?Pain Intervention(s): Limited activity within patient's tolerance, Repositioned ? ?   ?   ? ?Frequency ? Min 2X/week  ? ? ? ? ?  ?Progress Toward Goals ? ?OT Goals(current goals can now be found in the care plan section) ? Progress towards OT goals: Progressing toward goals ? ?Acute Rehab OT Goals ?Patient Stated Goal: to improve pain ?OT Goal Formulation: With family ?Time For Goal Achievement: 2021-12-23 ?Potential to Achieve Goals: Fair  ?Plan Discharge plan remains appropriate;Frequency remains appropriate   ? ?Co-evaluation ? ? ? PT/OT/SLP Co-Evaluation/Treatment: Yes ?Reason for Co-Treatment: Complexity of the patient's impairments (multi-system involvement);Necessary to address cognition/behavior during functional activity;For patient/therapist safety;To address functional/ADL transfers ?  ?  ?  ? ?  ?AM-PAC OT "6 Clicks" Daily Activity     ?Outcome Measure ? ? Help from another person eating meals?: Total (NG tube) ?Help from another person taking care of personal grooming?: A Lot ?Help from another person toileting, which includes using toliet, bedpan, or urinal?: Total ?Help from another person bathing (including washing, rinsing, drying)?: A Lot ?Help from another person to put on and taking off regular upper body clothing?: A Lot ?Help from another person to put on and taking off regular lower body clothing?: A Lot ?6 Click Score: 10 ? ?  ?End of Session Equipment Utilized During Treatment: Oxygen ? ?OT Visit Diagnosis: Other abnormalities of gait and mobility (R26.89);Muscle weakness  (generalized) (M62.81) ?  ?Activity Tolerance Patient limited by fatigue ?  ?Patient Left in bed;with call bell/phone within reach;with bed alarm set;with family/visitor present ?  ?Nurse Communication Mobility status ?  ? ?   ? ?Time: 2876-8115 ?OT Time Calculation (min): 29 min ? ?Charges: OT General Charges ?$OT Visit: 1 Visit ?OT Treatments ?$Self Care/Home Management : 8-22 mins ? ?Fredirick Maudlin, OTR/L ?[place] ? ?

## 2021-12-03 NOTE — Progress Notes (Signed)
Nutrition Follow-up ? ?DOCUMENTATION CODES:  ? ?Obesity unspecified ? ?INTERVENTION:  ? ?Continue Osmolite 1.2 @ 60 ml/hr via NGT  ? ?45 ml Prosource daily.   ?  ?Tube feeding regimen provides 1768 kcal (100% of needs), 91 grams of protein, and 1181 ml of H2O.   ?  ?-Monitor Mg, K, and Phos daily and replete as needed secondary to high refeeding risk ? ?NUTRITION DIAGNOSIS:  ? ?Inadequate oral intake related to inability to eat as evidenced by NPO status. ? ?Ongoing ? ?GOAL:  ? ?Patient will meet greater than or equal to 90% of their needs ? ?Met with TF ? ?MONITOR:  ? ?Diet advancement, Labs, Weight trends, TF tolerance, Skin, I & O's ? ?REASON FOR ASSESSMENT:  ? ?Consult ?Enteral/tube feeding initiation and management ? ?ASSESSMENT:  ? ?Brittney Ramirez is a 81 y.o. female with medical history significant of hypertension, hyperlipidemia, GERD, hypothyroidism, OSA not on CPAP, CAD, CKD-3, CHF, atrial fibrillation on Xarelto, dementia, iron deficiency anemia, who presents with shortness of breath and altered mental status. ? ?Reviewed I/O's: +1.8 L x 24 hours and +3.5 L since admission ? ?UOP: 700 ml x 24 hours  ? ?Palliative care following; family does not desire PEG, however, would like to keep NGT for a few days to see if improved. Palliative care to follow-up with family meeting on 12/05/21. ? ?Osmolite 1.2 infusing via NGT at 60 ml/hr. Pt tolerating well. Pt still very drowsy, but will awaken briefly.  ? ?Noted Mg and Phos low. Secure chat sent to MD and pharmacist regarding repletion.  ? ?Medications reviewed and include lactated ringers infusion @ 75 ml/hr.  ? ?Labs reviewed: K WDL, Mg: 1.6 (on IV supplementation) and Phos: 1.4. CBGS: 119-161 (inpatient orders for glycemic control are none).   ? ?Diet Order:   ?Diet Order   ? ?       ?  Diet 2 gram sodium Room service appropriate? Yes; Fluid consistency: Thin  Diet effective now       ?  ? ?  ?  ? ?  ? ? ?EDUCATION NEEDS:  ? ?Education needs have been  addressed ? ?Skin:  Skin Assessment: Reviewed RN Assessment ? ?Last BM:  12/01/21 ? ?Height:  ? ?Ht Readings from Last 1 Encounters:  ?11/24/21 5' 6"  (1.676 m)  ? ? ?Weight:  ? ?Wt Readings from Last 1 Encounters:  ?12/03/21 89.8 kg  ? ? ?Ideal Body Weight:  59.1 kg ? ?BMI:  Body mass index is 31.95 kg/m?. ? ?Estimated Nutritional Needs:  ? ?Kcal:  1750-1950 ? ?Protein:  90-105 grams ? ?Fluid:  > 1.7 L ? ? ? ?Loistine Chance, RD, LDN, CDCES ?Registered Dietitian II ?Certified Diabetes Care and Education Specialist ?Please refer to [facility] for RD and/or RD on-call/weekend/after hours pager  ?

## 2021-12-03 NOTE — Progress Notes (Signed)
ANTICOAGULATION CONSULT NOTE - Initial Consult ? ?Pharmacy Consult for Enoxaparin ?Indication: atrial fibrillation ? ?Allergies  ?Allergen Reactions  ? Morphine And Related Nausea And Vomiting  ?  Diarrhea  ? Buprenorphine Hcl Nausea And Vomiting  ?  Diarrhea  ? Codeine Nausea Only  ?  GI upset ?  ? Desipramine Nausea Only  ? Fosamax [Alendronate] Rash  ? ? ?Patient Measurements: ?Height: '5\' 6"'$  (167.6 cm) ?Weight: 89.8 kg (197 lb 15.6 oz) ?IBW/kg (Calculated) : 59.3 ?Heparin Dosing Weight:   ? ?Vital Signs: ?Temp: 98.2 ?F (36.8 ?C) (04/18 0450) ?Temp Source: Oral (04/18 0450) ?BP: 125/75 (04/18 0450) ?Pulse Rate: 72 (04/18 0450) ? ?Labs: ?Recent Labs  ?  12/02/21 ?0617 12/03/21 ?0708 12/03/21 ?0937  ?HGB 10.5*  --   --   ?HCT 33.8*  --   --   ?PLT 110*  --   --   ?HEPRLOWMOCWT  --   --  1.21  ?CREATININE 0.84 0.79  --   ? ? ? ?Estimated Creatinine Clearance: 61.2 mL/min (by C-G formula based on SCr of 0.79 mg/dL). ? ? ?Medical History: ?Past Medical History:  ?Diagnosis Date  ? Aortic atherosclerosis ([place])   ? Arthritis   ? Atrial fibrillation ([place])   ? a.) CHA2DS2-VASc = 6 (age x 2, sex, CHF, HTN, aortic plaque). b.) rate/rhythm maintained on oral metoprolol succinate; chronically anticoagulated using dose reduced rivaroxaban d/t CKD.  ? Basal cell carcinoma   ? CHF (congestive heart failure) ([place]) 10/26/2014  ? a.)  TTE 10/26/2014: EF 45%; mild AR/PR; moderate MR/TR. b.) TTE 12/03/2016 mild LV dysfunction with LVH; EF 40%; global HK; severe LA and moderate RA enlargement; mild AR/PR, moderate MR/TR. c.)  TTE 01/25/2020: EF 45%; global HK; moderate LAE and mild RA enlargement; mild RV enlargement; trivial PR, mild AR, moderate to severe MR, moderate TR.  ? CKD (chronic kidney disease), stage III ([place])   ? Coronary artery calcification seen on CT scan   ? Dementia ([place])   ? a.) on donepezil + apoaequorin  ? Diverticulosis   ? DOE (dyspnea on exertion)   ? Fatty infiltration of liver   ? Full dentures   ? GERD  (gastroesophageal reflux disease)   ? Hyperlipidemia   ? Hypertension   ? Hypothyroidism   ? Jaw fracture ([place]) 02/1995  ? a.) BILATERAL subcondylar fractures  ? Long term current use of anticoagulant   ? a.) rivaroxaban; dose reduced d/t CKD  ? Miscarriage   ? OSA (obstructive sleep apnea)   ? a.) does not require nocturnal PAP therapy  ? Osteoporosis   ? Pulmonary nodules   ? Thoracic aortic aneurysm (TAA) ([place]) 12/14/2015  ? a.) CT 12/14/2015: measured 4.2 cm. b.) CT 11/28/2016: not appreciated by radiologist, however CT reviewed by vascular and 4.2-4.3 cm aneurysmal dilitation noted.  ? Thyroid cyst   ? ?Assessment: ?Patient is a 81 yo female with a history of afib on Xarelto. Patient is confused and is refusing oral medications at this time. Pharmacy consult to transition patient to Enoxaparin. ? ?4/18 - pt unable to tolerate PO's waxing/waning mental status. Given extended treatment dosing for LMWH and indeterminate duration req'd will check level 4hrs after dose to assess. ? ?Goal of Therapy:  ?Standard goal of Anti-Xa 0.6-1 (for '1mg'$ /kg q12h treatment dosing). ?Monitor platelets by anticoagulation protocol: Yes ?  ?Plan:  ?4/18 0937 LMWH level was 1.21 (last dose 0454 - ~4hr63mn peak.) ?Will decrease dosing by 20% given level above goal range. ?LMWH  $'85mg'p$  q12h > '65mg'$  q12h ?Monitor CBC and Scr per protocol. ? ?Lorna Dibble, PharmD, BCCP ?Clinical Pharmacist ?12/03/2021 10:27 AM ? ? ? ? ?

## 2021-12-03 NOTE — Consult Note (Signed)
? ?                                                                                ?Consultation Note ?Date: 12/03/2021  ? ?Patient Name: Brittney Ramirez  ?DOB: [birth year]  MRN: [id number]  Age / Sex: 81 y.o., female  ?PCP: Adin Hector, MD ?Referring Physician: Nolberto Hanlon, MD ? ?Reason for Consultation: Establishing goals of care ? ?HPI/Patient Profile: Brittney Ramirez is a 81 y.o. female with medical history significant of hypertension, hyperlipidemia, GERD, hypothyroidism, OSA not on CPAP, CAD, CKD-3, CHF, atrial fibrillation on Xarelto, dementia, iron deficiency anemia, who presents with shortness of breath and altered mental status. ? ?Clinical Assessment and Goals of Care: ? ? Notes and labs reviewed.  In to see patient, patient is resting in bed with son daughter-in-law and patient's husband at the bedside.  They called patient's other son on the phone. ? ?Family has been kept well updated and walks me through her hospitalization in vivid detail.  Discussed her baseline status, and they state prior to this hospitalization the patient sometimes would confuse one person for another, but was still driving and was still cooking and cleaning, and doing the things needed around the house. ? ? ?We discussed her diagnoses, prognosis, GOC, EOL wishes disposition and options. ? ?Created space and opportunity for patient  to explore thoughts and feelings regarding current medical information.  ? ?A detailed discussion was had today regarding advanced directives.  Concepts specific to code status, artifical feeding and hydration, IV antibiotics and rehospitalization were discussed.  The difference between an aggressive medical intervention path and a comfort care path was discussed.  Values and goals of care important to patient and family were attempted to be elicited. ? ?Discussed limitations of medical interventions to prolong quality of life in some situations and discussed the concept of human  mortality. ? ?Patient has just had an NG tube placed for tube feeds.  Her eyes remain closed but she appears restless and agitated, and moaning intermittently.  Family states it is like she is having a nightmare.  Family is unsure if she would have wanted a feeding tube, but now that it is present, they would like to give it a couple of days to see if her status improves.  They discussed concern with a possible dysphagia diet and quality of life. ? ?We will speak again tomorrow and give the family time to talk. ? ?SUMMARY OF RECOMMENDATIONS   ?We will plan to remeet tomorrow at 10 AM ? ? ? ?  ? ?Primary Diagnoses: ?Present on Admission: ? Benign essential HTN ? Chronic a-fib ([place]) ? Hyperlipidemia ? Hypothyroidism ? CAD (coronary artery disease) ? Iron deficiency anemia ? Dementia ([place]) ? Hypokalemia ? Acute metabolic encephalopathy ? Acute on chronic diastolic CHF (congestive heart failure) ([place]) ? ? ?I have reviewed the medical record, interviewed the patient and family, and examined the patient. The following aspects are pertinent. ? ?Past Medical History:  ?Diagnosis Date  ? Aortic atherosclerosis ([place])   ? Arthritis   ? Atrial fibrillation ([place])   ? a.) CHA2DS2-VASc = 6 (age x 2, sex, CHF, HTN, aortic plaque). b.)  rate/rhythm maintained on oral metoprolol succinate; chronically anticoagulated using dose reduced rivaroxaban d/t CKD.  ? Basal cell carcinoma   ? CHF (congestive heart failure) ([employer]) 10/26/2014  ? a.)  TTE 10/26/2014: EF 45%; mild AR/PR; moderate MR/TR. b.) TTE 12/03/2016 mild LV dysfunction with LVH; EF 40%; global HK; severe LA and moderate RA enlargement; mild AR/PR, moderate MR/TR. c.)  TTE 01/25/2020: EF 45%; global HK; moderate LAE and mild RA enlargement; mild RV enlargement; trivial PR, mild AR, moderate to severe MR, moderate TR.  ? CKD (chronic kidney disease), stage III ([place])   ? Coronary artery calcification seen on CT scan   ? Dementia ([place])   ? a.) on donepezil + apoaequorin  ?  Diverticulosis   ? DOE (dyspnea on exertion)   ? Fatty infiltration of liver   ? Full dentures   ? GERD (gastroesophageal reflux disease)   ? Hyperlipidemia   ? Hypertension   ? Hypothyroidism   ? Jaw fracture ([place]) 02/1995  ? a.) BILATERAL subcondylar fractures  ? Long term current use of anticoagulant   ? a.) rivaroxaban; dose reduced d/t CKD  ? Miscarriage   ? OSA (obstructive sleep apnea)   ? a.) does not require nocturnal PAP therapy  ? Osteoporosis   ? Pulmonary nodules   ? Thoracic aortic aneurysm (TAA) ([place]) 12/14/2015  ? a.) CT 12/14/2015: measured 4.2 cm. b.) CT 11/28/2016: not appreciated by radiologist, however CT reviewed by vascular and 4.2-4.3 cm aneurysmal dilitation noted.  ? Thyroid cyst   ? ?Social History  ? ?Socioeconomic History  ? Marital status: Married  ?  Spouse name: Charlotte Crumb  ? Number of children: Not on file  ? Years of education: Not on file  ? Highest education level: Some college, no degree  ?Occupational History  ? Occupation: retired  ?Tobacco Use  ? Smoking status: Former  ?  Types: Cigarettes  ?  Start date: 02/07/1955  ?  Quit date: 02/08/1955  ?  Years since quitting: 66.8  ? Smokeless tobacco: Never  ?Vaping Use  ? Vaping Use: Never used  ?Substance and Sexual Activity  ? Alcohol use: No  ?  Alcohol/week: 0.0 standard drinks  ? Drug use: No  ? Sexual activity: Not on file  ?Other Topics Concern  ? Not on file  ?Social History Narrative  ? Not on file  ? ?Social Determinants of Health  ? ?Financial Resource Strain: Not on file  ?Food Insecurity: Not on file  ?Transportation Needs: Not on file  ?Physical Activity: Not on file  ?Stress: Not on file  ?Social Connections: Not on file  ? ?Family History  ?Problem Relation Age of Onset  ? Heart disease Mother   ? Hypertension Mother   ? Heart disease Father   ? Hypertension Father   ? Arthritis Sister   ? Cancer Sister   ? Diabetes Sister   ? Heart disease Sister   ? Breast cancer Sister 81  ? Diabetes Brother   ? Heart disease Brother    ? Hypertension Brother   ? ?Scheduled Meds: ? Chlorhexidine Gluconate Cloth  6 each Topical Daily  ? enoxaparin (LOVENOX) injection  65 mg Subcutaneous Q12H  ? erythromycin  1 application. Left Eye BID  ? feeding supplement (PROSource TF)  45 mL Per Tube Daily  ? latanoprost  1 drop Both Eyes QHS  ? levothyroxine  88 mcg Oral QAC breakfast  ? metoprolol tartrate  50 mg Oral BID  ? vitamin B-12  1,000 mcg Oral Daily  ? ?Continuous Infusions: ? sodium chloride Stopped (11/26/21 1844)  ? diltiazem (CARDIZEM) infusion 10 mg/hr (12/03/21 0453)  ? feeding supplement (OSMOLITE 1.2 CAL) 40 mL/hr at 12/03/21 0754  ? lactated ringers 75 mL/hr at 12/03/21 0755  ? meropenem (MERREM) IV 1 g (12/03/21 0327)  ? ?PRN Meds:.sodium chloride, acetaminophen, albuterol, diltiazem, hydrALAZINE, LORazepam, methocarbamol, metoprolol tartrate, ondansetron (ZOFRAN) IV ?Medications Prior to Admission:  ?Prior to Admission medications   ?Medication Sig Start Date End Date Taking? Authorizing Provider  ?Apoaequorin (PREVAGEN) 10 MG CAPS Take 10 mg by mouth daily.   Yes [provider]  ?atorvastatin (LIPITOR) 40 MG tablet Take 1 tablet (40 mg total) by mouth at bedtime. 07/29/19  Yes Volney American, PA-C  ?erythromycin ophthalmic ointment Place 1 application into the left eye in the morning and at bedtime.   Yes [provider]  ?latanoprost (XALATAN) 0.005 % ophthalmic solution Place 1 drop into both eyes at bedtime. 08/20/14  Yes [provider]  ?levothyroxine (SYNTHROID) 88 MCG tablet Take 1 tablet (88 mcg total) by mouth daily before breakfast. 07/29/19  Yes Volney American, PA-C  ?metoprolol succinate (TOPROL-XL) 100 MG 24 hr tablet Take 100 mg by mouth daily. 08/27/20  Yes [provider]  ?vitamin B-12 (CYANOCOBALAMIN) 1000 MCG tablet Take 1,000 mcg by mouth daily.   Yes [provider]  ?XARELTO 15 MG TABS tablet Take 15 mg by mouth daily. 07/08/20  Yes [provider]   ?acetaminophen (TYLENOL) 500 MG tablet Take 2 tablets (1,000 mg total) by mouth every 6 (six) hours. 10/19/21   Duanne Guess, PA-C  ?docusate sodium (COLACE) 100 MG capsule Take 1 capsule (100 mg total) by mouth 2 (two) times

## 2021-12-03 NOTE — TOC Progression Note (Signed)
Transition of Care (TOC) - Progression Note  ? ? ?Patient Details  ?Name: Brittney Ramirez ?MRN: [id number] ?Date of Birth: 1941-03-21 ? ?Transition of Care (TOC) CM/SW Contact  ?Alberteen Sam, LCSW ?Phone Number: ?12/03/2021, 3:09 PM ? ?Clinical Narrative:    ? ?TOC will continue to follow for discharge planning.  ? ?CSW notes family decided not to pursue NG tube and continues to talk with palliative, will re assess how patient does Thursday planned palliative meeting to continue to discuss plan and [place].  ? ?Expected Discharge Plan: [place] ?Barriers to Discharge: Continued Medical Work up ? ?Expected Discharge Plan and Services ?Expected Discharge Plan: [place] ?  ?  ?  ?Living arrangements for the past 2 months: [place] ?                ?  ?  ?  ?  ?  ?  ?  ?  ?  ?  ? ? ?Social Determinants of Health (SDOH) Interventions ?  ? ?Readmission Risk Interventions ?   ? View : No data to display.  ?  ?  ?  ? ? ?

## 2021-12-03 NOTE — Progress Notes (Addendum)
? ?                                                                                                                                                     ?                                                   ?Daily Progress Note  ? ?Patient Name: Brittney Ramirez       Date: 12/03/2021 ?DOB: [birth year]  Age: 81 y.o. MRN#: [id number] ?Attending Physician: Nolberto Hanlon, MD ?Primary Care Physician: Adin Hector, MD ?Admit Date: 11/24/2021 ? ?Reason for Consultation/Follow-up: Establishing goals of care ? ?Subjective: ?Notes and labs reviewed.  In to re-meet with patient and family including her husband and son for continued goals of care conversation, and attending is speaking with family.  Following attending conversation, patient's other son was called on the phone.  We discussed diagnoses and overall prognosis. ? ?Patient appears calm and comfortable with NG tube in place.  Family states this is due to medication that was provided yesterday for her, Robaxin and Tylenol.  They state they have decided they will not place a PEG tube in Brittney Ramirez.  They tell me that they would like to give it a couple of days to see how she does and to see if she improves.  We discussed SLP evaluation if she does improve and is more alert.  We discussed hospice level care if she does not.  They discussed that they do not want her to suffer, and really truly want to do what ever it is that she wants.  They again discuss that prior to the hospitalization she would become confused somewhat at times, but was still able to drive and to do her typical chores around the house. ? ?Patient opened eyes during conversation, and we attempted to ask her feelings on things, but she was only able to say "yeah "when asked if she is okay with the care being currently provided. ? ?Discussed returning to remeet Thursday morning to discuss plans moving forward. ? ?Length of Stay: 9 ? ?Current Medications: ?Scheduled Meds:  ? Chlorhexidine Gluconate Cloth  6 each  Topical Daily  ? enoxaparin (LOVENOX) injection  65 mg Subcutaneous Q12H  ? erythromycin  1 application. Left Eye BID  ? feeding supplement (PROSource TF)  45 mL Per Tube Daily  ? latanoprost  1 drop Both Eyes QHS  ? levothyroxine  88 mcg Oral QAC breakfast  ? metoprolol tartrate  50 mg Oral BID  ? vitamin B-12  1,000 mcg Oral Daily  ? ? ?Continuous Infusions: ? sodium chloride Stopped (11/26/21 1844)  ? diltiazem (CARDIZEM) infusion  10 mg/hr (12/03/21 0453)  ? feeding supplement (OSMOLITE 1.2 CAL) 40 mL/hr at 12/03/21 0754  ? lactated ringers 75 mL/hr at 12/03/21 0755  ? meropenem (MERREM) IV 1 g (12/03/21 0327)  ? ? ?PRN Meds: ?sodium chloride, acetaminophen, albuterol, diltiazem, hydrALAZINE, LORazepam, methocarbamol, metoprolol tartrate, ondansetron (ZOFRAN) IV ? ?Physical Exam ?Pulmonary:  ?   Effort: Pulmonary effort is normal.  ?Neurological:  ?   Mental Status: She is alert.  ?         ? ?Vital Signs: BP 125/75 (BP Location: Left Arm)   Pulse 72   Temp 98.2 ?F (36.8 ?C) (Oral)   Resp 18   Ht '5\' 6"'$  (1.676 m)   Wt 89.8 kg   SpO2 99%   BMI 31.95 kg/m?  ?SpO2: SpO2: 99 % ?O2 Device: O2 Device: Nasal Cannula ?O2 Flow Rate: O2 Flow Rate (L/min): 2 L/min ? ?Intake/output summary:  ?Intake/Output Summary (Last 24 hours) at 12/03/2021 1057 ?Last data filed at 12/03/2021 0500 ?Gross per 24 hour  ?Intake 2303.48 ml  ?Output 700 ml  ?Net 1603.48 ml  ? ?LBM: Last BM Date : 12/01/21 ?Baseline Weight: Weight: 86.6 kg ?Most recent weight: Weight: 89.8 kg ? ?  ? ?Patient Active Problem List  ? Diagnosis Date Noted  ? Chronic diastolic CHF (congestive heart failure) ([place]) 11/24/2021  ? CAD (coronary artery disease) 11/24/2021  ? Iron deficiency anemia 11/24/2021  ? Dementia ([place]) 11/24/2021  ? Hypokalemia 11/24/2021  ? Acute metabolic encephalopathy 37/11/8887  ? Acute on chronic diastolic CHF (congestive heart failure) ([place]) 11/24/2021  ? Status post total hip replacement, right 10/17/2021  ? Inability to ambulate due  to right hip 09/19/2020  ? Weakness 09/18/2020  ? Cerumen debris on tympanic membrane, right 06/17/2018  ? UTI (urinary tract infection) 06/17/2018  ? Multiple rib fractures 01/27/2018  ? Advanced care planning/counseling discussion 06/11/2017  ? Thoracic aortic aneurysm without rupture ([place]) 12/09/2016  ? Aortic calcification ([place]) 12/09/2016  ? SOBOE (shortness of breath on exertion) 11/03/2016  ? Gastroesophageal reflux disease without esophagitis 05/14/2016  ? CKD (chronic kidney disease) stage 3, GFR 30-59 ml/min ([facility]) 05/14/2016  ? Atrial fibrillation ([place]) 12/05/2015  ? Hypothyroidism 12/05/2015  ? Hyperlipidemia 12/05/2015  ? Hypertensive heart and renal disease with congestive heart failure and renal failure ([place]) 06/07/2015  ? CHF (congestive heart failure), NYHA class II, chronic, systolic ([place]) 16/94/5038  ? Pulmonary nodules 05/07/2015  ? Chronic systolic CHF (congestive heart failure), NYHA class 2 ([place]) 10/11/2014  ? Benign essential HTN 04/06/2014  ? Chronic a-fib ([place]) 04/06/2014  ? ? ?Palliative Care Assessment & Plan  ? ? ? ?Recommendations/Plan: ?Family would like time for outcomes.  Will remeet on Thursday morning, or earlier if there is a significant change in the interim. ?No PEG tube.  Determining if she will "bounce back", or if hospice will be implemented. ? ? ?Code Status: ? ?  ?Code Status Orders  ?(From admission, onward)  ?  ? ? ?  ? ?  Start     Ordered  ? 11/24/21 1539  Do not attempt resuscitation (DNR)  Continuous       ? 11/24/21 1538  ? ?  ?  ? ?  ? ?Code Status History   ? ? Date Active Date Inactive Code Status Order ID Comments User Context  ? 11/24/2021 1359 11/24/2021 1538 DNR [id number]  Ivor Costa, MD ED  ? 11/24/2021 1203 11/24/2021 1358 Full Code [id number]  Ivor Costa, MD ED  ? 10/17/2021  [place] 10/19/2021 1809 Full Code [id number]  Hessie Knows, MD Inpatient  ? 09/18/2020 1304 09/20/2020 1752 Full Code [id number]  CoxBriant Cedar, DO ED  ? ?  ? ? ?Thank you for allowing the Palliative Medicine  Team to assist in the care of this patient. ? ? ?Asencion Gowda, NP ? ?Please contact Palliative Medicine Team phone at [phone] for questions and concerns.  ? ? ? ? ? ?

## 2021-12-03 NOTE — Progress Notes (Signed)
Physical Therapy Treatment ?Patient Details ?Name: Brittney Ramirez ?MRN: [id number] ?DOB: 1941/07/03 ?Today's Date: 12/03/2021 ? ? ?History of Present Illness Pt is an 81 y/o F who was admitted after presenting with SOB & AMS. Pt admitted for tx of acute on chronic diastolic CHF. Pt had rapid response called on 4/12 2/2 a-fib with RVR HR & pt was transferred to ICU & placed on cardizem drip. MRI showed small remote lacunar infarct of R thalamus. Pt was recently hospitalized from 3/2-3/4 for R hip fx. PMH: hypothyroidism, OSA not on CPAP, CAD, CKD 3, CHF, a-fib on xarelto, dementia, iron deficiency anemia ? ?  ?PT Comments  ? ? Multiple family members in the room. Patient is lethargic at times and needs cues to maintain alertness for participation with PT. She continues to require total assistance +2 person for bed mobility. Poor sitting balance that required facilitation to maintain midline. She reports back pain and appears to have BUE pain with movement. Unable to progress to standing due to limited activity tolerance and fatigue with minimal activity, poor sitting balance, and generalized weakness. Recommend to continue PT to maximize independence and decreased caregiver burden.  ?  ?Recommendations for follow up therapy are one component of a multi-disciplinary discharge planning process, led by the attending physician.  Recommendations may be updated based on patient status, additional functional criteria and insurance authorization. ? ?Follow Up Recommendations ? Skilled nursing-short term rehab (<3 hours/day) ?  ?  ?Assistance Recommended at Discharge Frequent or constant Supervision/Assistance  ?Patient can return home with the following Two people to help with walking and/or transfers;Two people to help with bathing/dressing/bathroom;Direct supervision/assist for medications management;Help with stairs or ramp for entrance;Assistance with feeding;Assist for transportation;Assistance with cooking/housework;Direct  supervision/assist for financial management ?  ?Equipment Recommendations ? Hospital bed;Wheelchair (measurements PT);Wheelchair cushion (measurements PT)  ?  ?Recommendations for Other Services   ? ? ?  ?Precautions / Restrictions Precautions ?Precautions: Fall ?Restrictions ?Weight Bearing Restrictions: No  ?  ? ?Mobility ? Bed Mobility ?Overal bed mobility: Needs Assistance ?Bed Mobility: Supine to Sit, Sit to Supine ?Rolling: Total assist, +2 for physical assistance ?  ?Supine to sit: Total assist, +2 for physical assistance ?Sit to supine: Total assist, +2 for physical assistance ?  ?General bed mobility comments: assistance for BLE and trunk support ?  ? ?Transfers ?  ?  ?  ?  ?  ?  ?  ?  ?  ?General transfer comment: unable to attempt due to poor sitting balance, generalized weakness ?  ? ?Ambulation/Gait ?  ?  ?  ?  ?  ?  ?  ?  ? ? ?Stairs ?  ?  ?  ?  ?  ? ? ?Wheelchair Mobility ?  ? ?Modified Rankin (Stroke Patients Only) ?  ? ? ?  ?Balance Overall balance assessment: Needs assistance ?Sitting-balance support: Feet supported, Bilateral upper extremity supported ?Sitting balance-Leahy Scale: Poor ?Sitting balance - Comments: Min A- Max A required to maintain sitting balance with posterior lean. faciliation and verbal cues for correction to midline. brief periods of CGA ?  ?  ?  ?  ?  ?  ?  ?  ?  ?  ?  ?  ?  ?  ?  ?  ? ?  ?Cognition Arousal/Alertness: Awake/alert (lethargic at times, needs cues for participation) ?Behavior During Therapy: Anxious ?Overall Cognitive Status: Impaired/Different from baseline ?  ?  ?  ?  ?  ?  ?  ?  ?  ?  ?  ?  ?  ?  ?  ?  ?  General Comments: patient has eyes closed most of the time but can open with cues. multi-modal cues required to follow single step commands ?  ?  ? ?  ?Exercises   ? ?  ?General Comments   ?  ?  ? ?Pertinent Vitals/Pain Pain Assessment ?Pain Assessment: Faces ?Faces Pain Scale: Hurts even more ?Pain Location: mostly BUE with movement, she also reports back  pain ?Pain Descriptors / Indicators: Grimacing, Guarding ?Pain Intervention(s): Limited activity within patient's tolerance, Repositioned  ? ? ?Home Living   ?  ?  ?  ?  ?  ?  ?  ?  ?  ?   ?  ?Prior Function    ?  ?  ?   ? ?PT Goals (current goals can now be found in the care plan section) Acute Rehab PT Goals ?Patient Stated Goal: none stated ?PT Goal Formulation: With family ?Time For Goal Achievement: 12/26/21 ?Potential to Achieve Goals: Poor ?Progress towards PT goals: Progressing toward goals ? ?  ?Frequency ? ? ? Min 2X/week ? ? ? ?  ?PT Plan Current plan remains appropriate  ? ? ?Co-evaluation   ?Reason for Co-Treatment: Complexity of the patient's impairments (multi-system involvement);Necessary to address cognition/behavior during functional activity;For patient/therapist safety;To address functional/ADL transfers ?PT goals addressed during session: Mobility/safety with mobility;Balance ?OT goals addressed during session: ADL's and self-care ?  ? ?  ?AM-PAC PT "6 Clicks" Mobility   ?Outcome Measure ? Help needed turning from your back to your side while in a flat bed without using bedrails?: Total ?Help needed moving from lying on your back to sitting on the side of a flat bed without using bedrails?: Total ?Help needed moving to and from a bed to a chair (including a wheelchair)?: Total ?Help needed standing up from a chair using your arms (e.g., wheelchair or bedside chair)?: Total ?Help needed to walk in hospital room?: Total ?Help needed climbing 3-5 steps with a railing? : Total ?6 Click Score: 6 ? ?  ?End of Session   ?Activity Tolerance: Patient limited by fatigue ?Patient left: with family/visitor present;with call bell/phone within reach;with bed alarm set;in bed (bed placed in semi-chair position per the son's request) ?Nurse Communication: Mobility status ?PT Visit Diagnosis: Muscle weakness (generalized) (M62.81);Difficulty in walking, not elsewhere classified (R26.2);Unsteadiness on feet  (R26.81);Pain ?  ? ? ?Time: 8250-0370 ?PT Time Calculation (min) (ACUTE ONLY): 29 min ? ?Charges:  $Therapeutic Activity: 8-22 mins          ?          ?Minna Merritts, PT, MPT ? ? ?Percell Locus ?12/03/2021, 1:06 PM ? ?

## 2021-12-03 NOTE — Progress Notes (Signed)
?PROGRESS NOTE ? ? ? ?EXA BOMBA  ZGY:[id number] DOB: 03/10/1941 DOA: 11/24/2021 ?PCP: Adin Hector, MD  ? ? ?Brief Narrative:  ?hypothyroidism, OSA not on CPAP, CAD, CKD-3, CHF, atrial fibrillation on Xarelto, dementia, iron deficiency anemia, who presents with shortness of breath and altered mental status. ?  ?Patient was recently hospitalized from 3/2 - 3/4 due to right hip fracture.  Patient is s/p of right hip surgery. She finished course of rehab.  Per her son and husband at the bedside, in the past 2 days, patient has shortness breath which has been progressively worsening.  No chest pain, fever or chills.  Has mild dry cough.  Patient has history of dementia, but most of the time patient recognizes family members, is oriented to time and place. Patient is more confused  ?  ?Pulmonary embolism ruled out.  Presentation consistent with acute decompensated heart failure.  Started on IV diuretics.  Atrial fibrillation with uncontrolled rate, cardiology following. ? ? hospital course: 4/12-4/18 ?Patient was found to be dehydrated.  His LFTs were in the thousands and started trending down with IV fluids.  She was found with dementia with delirium and aspiration pneumonia.  Antibiotics were broadened for her aspiration.  Started on IV fluids.  Neurology was consulted to discuss delirium and dementia with family as they had a hard time understanding and denied this.  Patient was started on NG tube for feeding (started on 4/17).per family's request.  Her AKI and LFTs were improving.  Mental status is minimally better.  Palliative care on board for goals of care. She is being weaned off cardizem gtt . She will need SNF once stable for discharge. ? ? ?4/18UO was less and dark, increase ivf this am.Palliative consulted to establish goal of care.  Family will meet on Thursday morning to further discuss.  They would like to determine if she will bounce back or if hospice will be implemented ? ? ?Consultants:   ?Cardiology ?neurology ? ? ? ?Procedures: CT , MRI ? ?Antimicrobials:  ?Rocephin, azithromycin.dc'd ?Meropenem>>. ? ? ?Subjective: ?Opens eyes, answers questions. But still confused. NGT in place .  ? ?Objective: ?Vitals:  ? 12/03/21 0450 12/03/21 1210 12/03/21 1213 12/03/21 1620  ?BP: 125/75 105/71 105/71 117/72  ?Pulse: 72 75 74 65  ?Resp: '18  20 18  '$ ?Temp: 98.2 ?F (36.8 ?C)  (!) 97.5 ?F (36.4 ?C) 98.2 ?F (36.8 ?C)  ?TempSrc: Oral     ?SpO2: 99%  96% 100%  ?Weight:      ?Height:      ? ? ?Intake/Output Summary (Last 24 hours) at 12/03/2021 1625 ?Last data filed at 12/03/2021 1500 ?Gross per 24 hour  ?Intake 1904.26 ml  ?Output 900 ml  ?Net 1004.26 ml  ? ?Filed Weights  ? 12/01/21 0416 12/02/21 0429 12/03/21 0437  ?Weight: 85.3 kg 87.4 kg 89.8 kg  ? ? ?Examination: ?Calm, NAD, confused, opens eyes, responds ?Decreased breath sounds no wheezing ?Irregular s1/s2 no gallop ?Soft benign +bs ?Trace pedal edema ?Confused but awakens ?Mood and affect appropriate in current setting  ? ? ? ?Data Reviewed: I have personally reviewed following labs and imaging studies ? ?CBC: ?Recent Labs  ?Lab 11/27/21 ?1123 11/28/21 ?5916 11/29/21 ?3846 11/30/21 ?0440 12/02/21 ?0617  ?WBC 7.9 8.6 6.9 8.2 10.3  ?HGB 10.0* 9.5* 9.6* 10.8* 10.5*  ?HCT 33.1* 31.9* 31.8* 36.1 33.8*  ?MCV 98.2 98.5 97.2 97.3 94.7  ?PLT 165 147* 126* 115* 110*  ? ?Basic Metabolic Panel: ?Recent Labs  ?  Lab 11/27/21 ?1123 11/28/21 ?8315 11/29/21 ?1761 11/30/21 ?0440 12/02/21 ?6073 12/02/21 ?7106 12/03/21 ?2694  ?NA 147* 145 141  --  139  --  138  ?K 3.4* 3.1* 3.0* 3.9 2.8*  --  3.5  ?CL 111 111 107  --  105  --  104  ?CO2 '22 24 25  '$ --  26  --  27  ?GLUCOSE 124* 141* 127*  --  102*  --  143*  ?BUN 47* 44* 30*  --  15  --  18  ?CREATININE 2.08* 1.60* 1.09*  --  0.84  --  0.79  ?CALCIUM 8.6* 8.4* 8.4*  --  8.5*  --  8.2*  ?MG  --   --   --   --   --  1.5* 1.6*  ?PHOS  --   --   --   --   --  1.6* 1.4*  ? ?GFR: ?Estimated Creatinine Clearance: 61.2 mL/min (by C-G formula  based on SCr of 0.79 mg/dL). ?Liver Function Tests: ?Recent Labs  ?Lab 11/28/21 ?8546 11/29/21 ?2703 11/30/21 ?0440 12/01/21 ?5009 12/03/21 ?3818  ?AST 759* 353* 216* 101* 34  ?ALT 811* 575* 494* 324* 149*  ?ALKPHOS 97 89 95 100 75  ?BILITOT 1.2 1.4* 1.6* 2.4* 1.3*  ?PROT 6.1* 5.7* 6.3* 6.4* 5.4*  ?ALBUMIN 3.4* 3.3* 3.4* 3.5 2.7*  ? ?No results for input(s): LIPASE, AMYLASE in the last 168 hours. ?Recent Labs  ?Lab 11/27/21 ?1558  ?AMMONIA 26  ? ?Coagulation Profile: ?Recent Labs  ?Lab 11/28/21 ?2993 11/29/21 ?1648  ?INR 2.3* 1.8*  ? ?Cardiac Enzymes: ?No results for input(s): CKTOTAL, CKMB, CKMBINDEX, TROPONINI in the last 168 hours. ?BNP (last 3 results) ?No results for input(s): PROBNP in the last 8760 hours. ?HbA1C: ?No results for input(s): HGBA1C in the last 72 hours. ?CBG: ?Recent Labs  ?Lab 12/02/21 ?2051 12/02/21 ?2330 12/03/21 ?0429 12/03/21 ?7169 12/03/21 ?1232  ?GLUCAP 134* 141* 119* 122* 161*  ? ?Lipid Profile: ?No results for input(s): CHOL, HDL, LDLCALC, TRIG, CHOLHDL, LDLDIRECT in the last 72 hours. ?Thyroid Function Tests: ?No results for input(s): TSH, T4TOTAL, FREET4, T3FREE, THYROIDAB in the last 72 hours. ?Anemia Panel: ?No results for input(s): VITAMINB12, FOLATE, FERRITIN, TIBC, IRON, RETICCTPCT in the last 72 hours. ?Sepsis Labs: ?No results for input(s): PROCALCITON, LATICACIDVEN in the last 168 hours. ? ? ?Recent Results (from the past 240 hour(s))  ?Blood Culture (routine x 2)     Status: None  ? Collection Time: 11/24/21 10:29 AM  ? Specimen: BLOOD  ?Result Value Ref Range Status  ? Specimen Description BLOOD LEFT ANTECUBITAL  Final  ? Special Requests   Final  ?  BOTTLES DRAWN AEROBIC AND ANAEROBIC Blood Culture adequate volume  ? Culture   Final  ?  NO GROWTH 5 DAYS ?Performed at [facility], [address], [place], [place] [address] ?  ? Report Status 11/29/2021 FINAL  Final  ?Blood Culture (routine x 2)     Status: None  ? Collection Time: 11/24/21 10:29 AM  ? Specimen:  BLOOD  ?Result Value Ref Range Status  ? Specimen Description BLOOD RIGHT ANTECUBITAL  Final  ? Special Requests   Final  ?  BOTTLES DRAWN AEROBIC AND ANAEROBIC Blood Culture adequate volume  ? Culture   Final  ?  NO GROWTH 5 DAYS ?Performed at [facility], [address], [place], [place] [address] ?  ? Report Status 11/29/2021 FINAL  Final  ?Resp Panel by RT-PCR (Flu A&B, Covid)  Nasopharyngeal Swab     Status: None  ? Collection Time: 11/24/21 11:20 AM  ? Specimen: Nasopharyngeal Swab; Nasopharyngeal(NP) swabs in vial transport medium  ?Result Value Ref Range Status  ? SARS Coronavirus 2 by RT PCR NEGATIVE NEGATIVE Final  ?  Comment: (NOTE) ?SARS-CoV-2 target nucleic acids are NOT DETECTED. ? ?The SARS-CoV-2 RNA is generally detectable in upper respiratory ?specimens during the acute phase of infection. The lowest ?concentration of SARS-CoV-2 viral copies this assay can detect is ?138 copies/mL. A negative result does not preclude SARS-Cov-2 ?infection and should not be used as the sole basis for treatment or ?other patient management decisions. A negative result may occur with  ?improper specimen collection/handling, submission of specimen other ?than nasopharyngeal swab, presence of viral mutation(s) within the ?areas targeted by this assay, and inadequate number of viral ?copies(<138 copies/mL). A negative result must be combined with ?clinical observations, patient history, and epidemiological ?information. The expected result is Negative. ? ?Fact Sheet for Patients:  ?EntrepreneurPulse.com.au ? ?Fact Sheet for Healthcare Providers:  ?IncredibleEmployment.be ? ?This test is no t yet approved or cleared by the [place] FDA and  ?has been authorized for detection and/or diagnosis of SARS-CoV-2 by ?FDA under an Emergency Use Authorization (EUA). This EUA will remain  ?in effect (meaning this test can be used) for the duration of the ?COVID-19 declaration under  Section 564(b)(1) of the Act, 21 ?U.S.C.section 360bbb-3(b)(1), unless the authorization is terminated  ?or revoked sooner.  ? ? ?  ? Influenza A by PCR NEGATIVE NEGATIVE Final  ? Influenza B by PCR NEGATIVE NEGA

## 2021-12-04 DIAGNOSIS — Z7189 Other specified counseling: Secondary | ICD-10-CM | POA: Diagnosis not present

## 2021-12-04 DIAGNOSIS — F0390 Unspecified dementia without behavioral disturbance: Secondary | ICD-10-CM | POA: Diagnosis not present

## 2021-12-04 DIAGNOSIS — I480 Paroxysmal atrial fibrillation: Secondary | ICD-10-CM

## 2021-12-04 DIAGNOSIS — I5033 Acute on chronic diastolic (congestive) heart failure: Secondary | ICD-10-CM | POA: Diagnosis not present

## 2021-12-04 DIAGNOSIS — J690 Pneumonitis due to inhalation of food and vomit: Secondary | ICD-10-CM | POA: Diagnosis not present

## 2021-12-04 LAB — BASIC METABOLIC PANEL
Anion gap: 5 (ref 5–15)
BUN: 21 mg/dL (ref 8–23)
CO2: 27 mmol/L (ref 22–32)
Calcium: 8 mg/dL — ABNORMAL LOW (ref 8.9–10.3)
Chloride: 105 mmol/L (ref 98–111)
Creatinine, Ser: 0.71 mg/dL (ref 0.44–1.00)
GFR, Estimated: >60 mL/min (ref >60)
Glucose, Bld: 143 mg/dL — ABNORMAL HIGH (ref 70–99)
Potassium: 3.5 mmol/L (ref 3.5–5.1)
Sodium: 137 mmol/L (ref 135–145)

## 2021-12-04 LAB — GLUCOSE, CAPILLARY
Glucose-Capillary: 133 mg/dL — ABNORMAL HIGH (ref 70–99)
Glucose-Capillary: 141 mg/dL — ABNORMAL HIGH (ref 70–99)
Glucose-Capillary: 128 mg/dL — ABNORMAL HIGH (ref 70–99)
Glucose-Capillary: 134 mg/dL — ABNORMAL HIGH (ref 70–99)
Glucose-Capillary: 126 mg/dL — ABNORMAL HIGH (ref 70–99)
Glucose-Capillary: 113 mg/dL — ABNORMAL HIGH (ref 70–99)

## 2021-12-04 LAB — CBC
HCT: 35.7 % — ABNORMAL LOW (ref 36.0–46.0)
Hemoglobin: 10.6 g/dL — ABNORMAL LOW (ref 12.0–15.0)
MCH: 28.3 pg (ref 26.0–34.0)
MCHC: 29.7 g/dL — ABNORMAL LOW (ref 30.0–36.0)
MCV: 95.5 fL (ref 80.0–100.0)
Platelets: 136 10*3/uL — ABNORMAL LOW (ref 150–400)
RBC: 3.74 MIL/uL — ABNORMAL LOW (ref 3.87–5.11)
RDW: 16.6 % — ABNORMAL HIGH (ref 11.5–15.5)
WBC: 10.5 10*3/uL (ref 4.0–10.5)
nRBC: 0 % (ref 0.0–0.2)

## 2021-12-04 LAB — MAGNESIUM: Magnesium: 2 mg/dL (ref 1.7–2.4)

## 2021-12-04 LAB — PHOSPHORUS: Phosphorus: 1.7 mg/dL — ABNORMAL LOW (ref 2.5–4.6)

## 2021-12-04 MED ORDER — K PHOS MONO-SOD PHOS DI & MONO 155-852-130 MG PO TABS
500.0000 mg | ORAL_TABLET | ORAL | Status: AC
  Administered 2021-12-04: 500 mg
  Filled 2021-12-04: qty 2

## 2021-12-04 MED ORDER — METOPROLOL TARTRATE 50 MG PO TABS
50.0000 mg | ORAL_TABLET | Freq: Two times a day (BID) | ORAL | Status: DC
  Administered 2021-12-04 – 2021-12-06 (×4): 50 mg
  Filled 2021-12-04 (×4): qty 1

## 2021-12-04 MED ORDER — K PHOS MONO-SOD PHOS DI & MONO 155-852-130 MG PO TABS
500.0000 mg | ORAL_TABLET | ORAL | Status: AC
  Administered 2021-12-04 (×3): 500 mg
  Filled 2021-12-04 (×3): qty 2

## 2021-12-04 MED ORDER — ACETAMINOPHEN 325 MG PO TABS: 650.0000 mg | ORAL_TABLET | Freq: Four times a day (QID) | ORAL | Status: DC | PRN

## 2021-12-04 MED ORDER — LEVOTHYROXINE SODIUM 88 MCG PO TABS
88.0000 ug | ORAL_TABLET | Freq: Every day | ORAL | Status: DC
  Administered 2021-12-04: 88 ug
  Filled 2021-12-04 (×2): qty 1

## 2021-12-04 MED ORDER — POTASSIUM CHLORIDE CRYS ER 20 MEQ PO TBCR: 40.0000 meq | EXTENDED_RELEASE_TABLET | Freq: Once | ORAL | Status: DC

## 2021-12-04 MED ORDER — METHOCARBAMOL 500 MG PO TABS: 500.0000 mg | ORAL_TABLET | Freq: Three times a day (TID) | ORAL | Status: DC | PRN

## 2021-12-04 MED ORDER — SODIUM CHLORIDE 0.9 % IV SOLN: INTRAVENOUS | Status: DC

## 2021-12-04 MED ORDER — MAGNESIUM OXIDE -MG SUPPLEMENT 400 (240 MG) MG PO TABS
400.0000 mg | ORAL_TABLET | Freq: Once | ORAL | Status: AC
  Administered 2021-12-04: 400 mg
  Filled 2021-12-04: qty 1

## 2021-12-04 MED ORDER — VITAMIN B-12 1000 MCG PO TABS
1000.0000 ug | ORAL_TABLET | Freq: Every day | ORAL | Status: DC
  Administered 2021-12-04: 1000 ug
  Filled 2021-12-04: qty 1

## 2021-12-04 MED ORDER — POTASSIUM CHLORIDE 20 MEQ PO PACK
40.0000 meq | PACK | Freq: Once | ORAL | Status: AC
  Administered 2021-12-04: 40 meq
  Filled 2021-12-04: qty 2

## 2021-12-04 NOTE — Progress Notes (Signed)
PT Cancellation Note ? ?Patient Details ?Name: Brittney Ramirez ?MRN: [id number] ?DOB: Nov 07, 1940 ? ? ?Cancelled Treatment:     PT attempt. PT hold. Pt and family requesting holding PT until after palliative meeting tomorrow at [place]. Acute PT will continue to follow and progress if pt decides to continue PT/ attempting to return to PLOF. Family and pt are considering comfort measures currently. Will follow up with pt after meeting tomorrow.  ? ? ?Willette Pa ?12/04/2021, 3:03 PM ?

## 2021-12-04 NOTE — Progress Notes (Signed)
Pt's son, Edd Arbour, called the on call chaplain to ask for support related to a family meeting with palliative care happening tomorrow (12/05/21). The front desk staff in [facility] lobby only allows 4 guests to enter the hospital at a time. The family has 5 members wanting to participate in the meeting. Pt's son may be reached at [phone]. ? ? ?

## 2021-12-04 NOTE — Progress Notes (Signed)
?PROGRESS NOTE ? ? ? ?Brittney Ramirez  YQM:[id number] DOB: June 06, 1941 DOA: 11/24/2021 ?PCP: Adin Hector, MD  ? ?Assessment & Plan: ?  ?Principal Problem: ?  Acute on chronic diastolic CHF (congestive heart failure) ([place]) ?Active Problems: ?  Hypothyroidism ?  Hyperlipidemia ?  Benign essential HTN ?  Chronic a-fib ([place]) ?  CAD (coronary artery disease) ?  Iron deficiency anemia ?  Dementia ([place]) ?  Hypokalemia ?  Acute metabolic encephalopathy ?  Thrombocytopenia ([place]) ? ? ?Acute on chronic diastolic CHF: echo on 01/13/4131 showed EF of 50%. Monitor I/Os. Continue on metoprolol. Euvolemic on 4/18 ? ?PAF: w/ RVR. Holding xarelto and continue on lovenox. Continue on metoprolol. IV cardizem prn  ? ?Aspiration pneumonia: continue on IV merrem, bronchodilators & encourage. Strep was positive. incentive spirometry ? ?Thrombocytopenia: HIT was neg 4/18. Will continue to monitor  ? ?Hypomagnesemia: mg sulfate ordered ? ?Hypophosphatemia: phosp ordered  ? ?UTI: completed abx course  ? ?Nutrition: continue w/ NG tube & tube feeds, started on 4/17. ? ?Acute metabolic encephalopathy: abnormal EEG on 4/15 & no further work-up other than whats already done as per neuro. MRI brain shows cerebral atrophy & small remote lacunar infarct at the right thalamus. Hx of dementia ? ?Transaminitis: etiology unclear. Continue w/ supportive care  ? ?Hypernatremia: resolved ? ?AKI: resolved ? ?Hypothyroidism: continue on synthroid ? ?HLD: continue to hold statin secondary to transaminitis  ? ?HTN: continue metoprolol & IV cardizem prn ? ?Hx of CAD: continue on metoprolol  ? ?Iron deficiency anemia: was not taking iron supplements at home. Will continue to monitor H&H  ? ? ? ? ?DVT prophylaxis: lovenox  ?Code Status: DNR ?Family Communication: discussed pt's care w/ pt's family at bedside and answered their questions  ?Disposition Plan: unclear, family meeting tomorrow  ? ?Level of care: Progressive ? ?Status is: Inpatient ?Remains inpatient  appropriate because: severity of illness ? ? ? ?Consultants:  ?Palliative care  ?neuro ? ? ?Procedures:  ? ?Antimicrobials: merrem  ? ? ?Subjective: ?Pt is unable to answer questions appropriately   ? ?Objective: ?Vitals:  ? 12/03/21 1955 12/04/21 0040 12/04/21 0318 12/04/21 0500  ?BP: 134/86 137/87 129/70   ?Pulse: 93 100 91   ?Resp: '18 18 18   '$ ?Temp: 98.3 ?F (36.8 ?C) 98.3 ?F (36.8 ?C) 98.4 ?F (36.9 ?C)   ?TempSrc:      ?SpO2: 100% 97% 98%   ?Weight:    90.1 kg  ?Height:      ? ? ?Intake/Output Summary (Last 24 hours) at 12/04/2021 0847 ?Last data filed at 12/04/2021 0617 ?Gross per 24 hour  ?Intake 3273.72 ml  ?Output 400 ml  ?Net 2873.72 ml  ? ?Filed Weights  ? 12/02/21 0429 12/03/21 0437 12/04/21 0500  ?Weight: 87.4 kg 89.8 kg 90.1 kg  ? ? ?Examination: ? ?General exam: Appears uncomfortable  ?Respiratory system: diminished breath sounds  ?Cardiovascular system: S1 & S2+. No rubs, gallops or clicks. ?Gastrointestinal system: Abdomen is obese, soft and nontender. Hypoactive bowel sounds heard. ?Central nervous system: Lethargic  ?Psychiatry: Judgement and insight appears poor. Flat mood and affect  ? ? ? ?Data Reviewed: I have personally reviewed following labs and imaging studies ? ?CBC: ?Recent Labs  ?Lab 11/28/21 ?4401 11/29/21 ?0272 11/30/21 ?0440 12/02/21 ?0617 12/04/21 ?5366  ?WBC 8.6 6.9 8.2 10.3 10.5  ?HGB 9.5* 9.6* 10.8* 10.5* 10.6*  ?HCT 31.9* 31.8* 36.1 33.8* 35.7*  ?MCV 98.5 97.2 97.3 94.7 95.5  ?PLT 147* 126* 115* 110* 136*  ? ?  Basic Metabolic Panel: ?Recent Labs  ?Lab 11/28/21 ?9563 11/29/21 ?8756 11/30/21 ?0440 12/02/21 ?0617 12/02/21 ?1648 12/03/21 ?0708 12/03/21 ?1912 12/04/21 ?0640  ?NA 145 141  --  139  --  138  --  137  ?K 3.1* 3.0* 3.9 2.8*  --  3.5  --  3.5  ?CL 111 107  --  105  --  104  --  105  ?CO2 24 25  --  26  --  27  --  27  ?GLUCOSE 141* 127*  --  102*  --  143*  --  143*  ?BUN 44* 30*  --  15  --  18  --  21  ?CREATININE 1.60* 1.09*  --  0.84  --  0.79  --  0.71  ?CALCIUM 8.4* 8.4*   --  8.5*  --  8.2*  --  8.0*  ?MG  --   --   --   --  1.5* 1.6* 2.3 2.0  ?PHOS  --   --   --   --  1.6* 1.4* 1.2* 1.7*  ? ?GFR: ?Estimated Creatinine Clearance: 61.3 mL/min (by C-G formula based on SCr of 0.71 mg/dL). ?Liver Function Tests: ?Recent Labs  ?Lab 11/28/21 ?4332 11/29/21 ?9518 11/30/21 ?0440 12/01/21 ?8416 12/03/21 ?6063  ?AST 759* 353* 216* 101* 34  ?ALT 811* 575* 494* 324* 149*  ?ALKPHOS 97 89 95 100 75  ?BILITOT 1.2 1.4* 1.6* 2.4* 1.3*  ?PROT 6.1* 5.7* 6.3* 6.4* 5.4*  ?ALBUMIN 3.4* 3.3* 3.4* 3.5 2.7*  ? ?No results for input(s): LIPASE, AMYLASE in the last 168 hours. ?Recent Labs  ?Lab 11/27/21 ?1558  ?AMMONIA 26  ? ?Coagulation Profile: ?Recent Labs  ?Lab 11/28/21 ?0160 11/29/21 ?1648  ?INR 2.3* 1.8*  ? ?Cardiac Enzymes: ?No results for input(s): CKTOTAL, CKMB, CKMBINDEX, TROPONINI in the last 168 hours. ?BNP (last 3 results) ?No results for input(s): PROBNP in the last 8760 hours. ?HbA1C: ?No results for input(s): HGBA1C in the last 72 hours. ?CBG: ?Recent Labs  ?Lab 12/03/21 ?1617 12/03/21 ?1955 12/03/21 ?1093 12/04/21 ?0319 12/04/21 ?2355  ?GLUCAP 134* 138* 129* 133* 141*  ? ?Lipid Profile: ?No results for input(s): CHOL, HDL, LDLCALC, TRIG, CHOLHDL, LDLDIRECT in the last 72 hours. ?Thyroid Function Tests: ?No results for input(s): TSH, T4TOTAL, FREET4, T3FREE, THYROIDAB in the last 72 hours. ?Anemia Panel: ?No results for input(s): VITAMINB12, FOLATE, FERRITIN, TIBC, IRON, RETICCTPCT in the last 72 hours. ?Sepsis Labs: ?No results for input(s): PROCALCITON, LATICACIDVEN in the last 168 hours. ? ?Recent Results (from the past 240 hour(s))  ?Blood Culture (routine x 2)     Status: None  ? Collection Time: 11/24/21 10:29 AM  ? Specimen: BLOOD  ?Result Value Ref Range Status  ? Specimen Description BLOOD LEFT ANTECUBITAL  Final  ? Special Requests   Final  ?  BOTTLES DRAWN AEROBIC AND ANAEROBIC Blood Culture adequate volume  ? Culture   Final  ?  NO GROWTH 5 DAYS ?Performed at [facility],  [address] [place], [place] [address] ?  ? Report Status 11/29/2021 FINAL  Final  ?Blood Culture (routine x 2)     Status: None  ? Collection Time: 11/24/21 10:29 AM  ? Specimen: BLOOD  ?Result Value Ref Range Status  ? Specimen Description BLOOD RIGHT ANTECUBITAL  Final  ? Special Requests   Final  ?  BOTTLES DRAWN AEROBIC AND ANAEROBIC Blood Culture adequate volume  ? Culture   Final  ?  NO GROWTH 5 DAYS ?  Performed at [facility], [address], [place], [place] [address] ?  ? Report Status 11/29/2021 FINAL  Final  ?Resp Panel by RT-PCR (Flu A&B, Covid) Nasopharyngeal Swab     Status: None  ? Collection Time: 11/24/21 11:20 AM  ? Specimen: Nasopharyngeal Swab; Nasopharyngeal(NP) swabs in vial transport medium  ?Result Value Ref Range Status  ? SARS Coronavirus 2 by RT PCR NEGATIVE NEGATIVE Final  ?  Comment: (NOTE) ?SARS-CoV-2 target nucleic acids are NOT DETECTED. ? ?The SARS-CoV-2 RNA is generally detectable in upper respiratory ?specimens during the acute phase of infection. The lowest ?concentration of SARS-CoV-2 viral copies this assay can detect is ?138 copies/mL. A negative result does not preclude SARS-Cov-2 ?infection and should not be used as the sole basis for treatment or ?other patient management decisions. A negative result may occur with  ?improper specimen collection/handling, submission of specimen other ?than nasopharyngeal swab, presence of viral mutation(s) within the ?areas targeted by this assay, and inadequate number of viral ?copies(<138 copies/mL). A negative result must be combined with ?clinical observations, patient history, and epidemiological ?information. The expected result is Negative. ? ?Fact Sheet for Patients:  ?EntrepreneurPulse.com.au ? ?Fact Sheet for Healthcare Providers:  ?IncredibleEmployment.be ? ?This test is no t yet approved or cleared by the [place] FDA and  ?has been authorized for detection and/or diagnosis  of SARS-CoV-2 by ?FDA under an Emergency Use Authorization (EUA). This EUA will remain  ?in effect (meaning this test can be used) for the duration of the ?COVID-19 declaration under Section 564(b)(1)

## 2021-12-04 NOTE — Progress Notes (Addendum)
? ?                                                                                                                                                     ?                                                   ?Daily Progress Note  ? ?Patient Name: Brittney Ramirez       Date: 12/04/2021 ?DOB: Jan 31, 1941  Age: 81 y.o. MRN#: [id number] ?Attending Physician: Wyvonnia Dusky, MD ?Primary Care Physician: Adin Hector, MD ?Admit Date: 11/24/2021 ? ?Reason for Consultation/Follow-up: Establishing goals of care ? ?Subjective: ?Notes and labs reviewed. In to check on patient. Husband is at bedside, he states she has been having dry mouth. NGT in place. Patient is more awake but is difficult to understand when she tries to speak. She is currently confused thinking we are in church. Updated her on the hospitalization, and she does not speak. Discussed comfort focus vs aggressive care. Husband is hopeful she will perk up to be able to talk tomorrow at our meeting. ? ?Length of Stay: 10 ? ?Current Medications: ?Scheduled Meds:  ? Chlorhexidine Gluconate Cloth  6 each Topical Daily  ? enoxaparin (LOVENOX) injection  65 mg Subcutaneous Q12H  ? erythromycin  1 application. Left Eye BID  ? feeding supplement (PROSource TF)  45 mL Per Tube Daily  ? latanoprost  1 drop Both Eyes QHS  ? levothyroxine  88 mcg Per Tube QAC breakfast  ? metoprolol tartrate  50 mg Per Tube BID  ? phosphorus  500 mg Per Tube Q4H  ? vitamin B-12  1,000 mcg Per Tube Daily  ? ? ?Continuous Infusions: ? sodium chloride Stopped (11/26/21 1844)  ? diltiazem (CARDIZEM) infusion 5 mg/hr (12/03/21 1307)  ? feeding supplement (OSMOLITE 1.2 CAL) 1,000 mL (12/04/21 0921)  ? lactated ringers 75 mL/hr at 12/04/21 0617  ? meropenem (MERREM) IV Stopped (12/04/21 0346)  ? ? ?PRN Meds: ?sodium chloride, acetaminophen, albuterol, diltiazem, hydrALAZINE, LORazepam, methocarbamol, metoprolol tartrate, ondansetron (ZOFRAN) IV ? ?Physical Exam ?Pulmonary:  ?   Effort: Pulmonary  effort is normal.  ?Neurological:  ?   Mental Status: She is alert.  ?         ? ?Vital Signs: BP 129/70 (BP Location: Left Arm)   Pulse 91   Temp 98.4 ?F (36.9 ?C)   Resp 18   Ht '5\' 6"'$  (1.676 m)   Wt 90.1 kg   SpO2 98%   BMI 32.06 kg/m?  ?SpO2: SpO2: 98 % ?O2 Device: O2 Device: Nasal Cannula ?O2 Flow Rate: O2 Flow Rate (L/min): 2 L/min ? ?Intake/output  summary:  ?Intake/Output Summary (Last 24 hours) at 12/04/2021 1011 ?Last data filed at 12/04/2021 0617 ?Gross per 24 hour  ?Intake 3273.72 ml  ?Output 400 ml  ?Net 2873.72 ml  ? ?LBM: Last BM Date : 12/01/21 ?Baseline Weight: Weight: 86.6 kg ?Most recent weight: Weight: 90.1 kg ? ? ? ?Patient Active Problem List  ? Diagnosis Date Noted  ? Thrombocytopenia ([place]) 12/03/2021  ? Chronic diastolic CHF (congestive heart failure) ([place]) 11/24/2021  ? CAD (coronary artery disease) 11/24/2021  ? Iron deficiency anemia 11/24/2021  ? Dementia ([place]) 11/24/2021  ? Hypokalemia 11/24/2021  ? Acute metabolic encephalopathy 25/12/3974  ? Acute on chronic diastolic CHF (congestive heart failure) ([place]) 11/24/2021  ? Status post total hip replacement, right 10/17/2021  ? Inability to ambulate due to right hip 09/19/2020  ? Weakness 09/18/2020  ? Cerumen debris on tympanic membrane, right 06/17/2018  ? UTI (urinary tract infection) 06/17/2018  ? Multiple rib fractures 01/27/2018  ? Advanced care planning/counseling discussion 06/11/2017  ? Thoracic aortic aneurysm without rupture ([place]) 12/09/2016  ? Aortic calcification ([place]) 12/09/2016  ? SOBOE (shortness of breath on exertion) 11/03/2016  ? Gastroesophageal reflux disease without esophagitis 05/14/2016  ? CKD (chronic kidney disease) stage 3, GFR 30-59 ml/min (HCC) 05/14/2016  ? Atrial fibrillation ([place]) 12/05/2015  ? Hypothyroidism 12/05/2015  ? Hyperlipidemia 12/05/2015  ? Hypertensive heart and renal disease with congestive heart failure and renal failure ([place]) 06/07/2015  ? CHF (congestive heart failure), NYHA class II,  chronic, systolic ([place]) 73/41/9379  ? Pulmonary nodules 05/07/2015  ? Chronic systolic CHF (congestive heart failure), NYHA class 2 ([place]) 10/11/2014  ? Benign essential HTN 04/06/2014  ? Chronic a-fib ([place]) 04/06/2014  ? ? ?Palliative Care Assessment & Plan  ? ? ?Recommendations/Plan: ? ?Will meet with family tomorrow morning. Concerned for prognosis.  ? ? ? ? ? ?Code Status: ? ?  ?Code Status Orders  ?(From admission, onward)  ?  ? ? ?  ? ?  Start     Ordered  ? 11/24/21 1539  Do not attempt resuscitation (DNR)  Continuous       ? 11/24/21 1538  ? ?  ?  ? ?  ? ?Code Status History   ? ? Date Active Date Inactive Code Status Order ID Comments User Context  ? 11/24/2021 1359 11/24/2021 1538 DNR [id number]  Ivor Costa, MD ED  ? 11/24/2021 1203 11/24/2021 1358 Full Code [id number]  Ivor Costa, MD ED  ? 10/17/2021 1237 10/19/2021 1809 Full Code [id number]  Hessie Knows, MD Inpatient  ? 09/18/2020 1304 09/20/2020 1752 Full Code [id number]  CoxBriant Cedar, DO ED  ? ? ?Care plan was discussed with RN ? ?Thank you for allowing the Palliative Medicine Team to assist in the care of this patient. ? ? ?Asencion Gowda, NP ? ?Please contact Palliative Medicine Team phone at [phone] for questions and concerns.  ? ? ? ? ? ?

## 2021-12-05 ENCOUNTER — Ambulatory Visit: Payer: Medicare HMO | Admitting: Family

## 2021-12-05 DIAGNOSIS — I5033 Acute on chronic diastolic (congestive) heart failure: Secondary | ICD-10-CM | POA: Diagnosis not present

## 2021-12-05 DIAGNOSIS — R627 Adult failure to thrive: Secondary | ICD-10-CM | POA: Diagnosis not present

## 2021-12-05 DIAGNOSIS — Z7189 Other specified counseling: Secondary | ICD-10-CM | POA: Diagnosis not present

## 2021-12-05 DIAGNOSIS — F03B0 Unspecified dementia, moderate, without behavioral disturbance, psychotic disturbance, mood disturbance, and anxiety: Secondary | ICD-10-CM | POA: Diagnosis not present

## 2021-12-05 LAB — CBC
HCT: 34.7 % — ABNORMAL LOW (ref 36.0–46.0)
Hemoglobin: 10.6 g/dL — ABNORMAL LOW (ref 12.0–15.0)
MCH: 29.2 pg (ref 26.0–34.0)
MCHC: 30.5 g/dL (ref 30.0–36.0)
MCV: 95.6 fL (ref 80.0–100.0)
Platelets: 148 10*3/uL — ABNORMAL LOW (ref 150–400)
RBC: 3.63 MIL/uL — ABNORMAL LOW (ref 3.87–5.11)
RDW: 16.9 % — ABNORMAL HIGH (ref 11.5–15.5)
WBC: 9.2 10*3/uL (ref 4.0–10.5)
nRBC: 0 % (ref 0.0–0.2)

## 2021-12-05 LAB — COMPREHENSIVE METABOLIC PANEL
ALT: 103 U/L — ABNORMAL HIGH (ref 0–44)
AST: 44 U/L — ABNORMAL HIGH (ref 15–41)
Albumin: 2.5 g/dL — ABNORMAL LOW (ref 3.5–5.0)
Alkaline Phosphatase: 75 U/L (ref 38–126)
Anion gap: 2 — ABNORMAL LOW (ref 5–15)
BUN: 18 mg/dL (ref 8–23)
CO2: 29 mmol/L (ref 22–32)
Calcium: 7.8 mg/dL — ABNORMAL LOW (ref 8.9–10.3)
Chloride: 106 mmol/L (ref 98–111)
Creatinine, Ser: 0.66 mg/dL (ref 0.44–1.00)
GFR, Estimated: >60 mL/min (ref >60)
Glucose, Bld: 106 mg/dL — ABNORMAL HIGH (ref 70–99)
Potassium: 4 mmol/L (ref 3.5–5.1)
Sodium: 137 mmol/L (ref 135–145)
Total Bilirubin: 0.8 mg/dL (ref 0.3–1.2)
Total Protein: 5.2 g/dL — ABNORMAL LOW (ref 6.5–8.1)

## 2021-12-05 LAB — GLUCOSE, CAPILLARY
Glucose-Capillary: 109 mg/dL — ABNORMAL HIGH (ref 70–99)
Glucose-Capillary: 105 mg/dL — ABNORMAL HIGH (ref 70–99)
Glucose-Capillary: 104 mg/dL — ABNORMAL HIGH (ref 70–99)

## 2021-12-05 MED ORDER — LORAZEPAM 2 MG/ML IJ SOLN: 1.0000 mg | INTRAMUSCULAR | Status: DC | PRN

## 2021-12-05 MED ORDER — DEXTROSE 5 % IV SOLN: INTRAVENOUS | Status: DC

## 2021-12-05 MED ORDER — MORPHINE SULFATE (PF) 2 MG/ML IV SOLN
2.0000 mg | INTRAVENOUS | Status: DC | PRN
  Administered 2021-12-06: 2 mg via INTRAVENOUS
  Filled 2021-12-05: qty 1

## 2021-12-05 NOTE — Progress Notes (Addendum)
? ?                                                                                                                                                     ?                                                   ?Daily Progress Note  ? ?Patient Name: Brittney Ramirez       Date: 12/05/2021 ?DOB: 1940/12/09  Age: 81 y.o. MRN#: [id number] ?Attending Physician: Wyvonnia Dusky, MD ?Primary Care Physician: Adin Hector, MD ?Admit Date: 11/24/2021 ? ?Reason for Consultation/Follow-up: Establishing goals of care ? ?Subjective: ?Notes and labs reviewed. Patient pulled her own NGT last night and staff was unable to place another.  ? ?Met with husband, sons, and other family members. We discussed the tube removal, and hydration and nutrition to this point. Discussed that with optimization thus far she is not currently in a place of rehab. They discuss that yesterday for a while she was more alert than she had been. Discussed allowing her to eat and drink and see how she does, but they state they feel the lucid times are a blessing. They believe her prognosis is very limited.  ? ?We discussed her diagnosis, prognosis, GOC, EOL wishes disposition and options. ? ?Created space and opportunity for patient  to explore thoughts and feelings regarding current medical information.  ? ?A detailed discussion was had today regarding advanced directives.  Concepts specific to code status, artifical feeding and hydration, IV antibiotics and rehospitalization were discussed.  The difference between an aggressive medical intervention path and a comfort care path was discussed.  Values and goals of care important to patient and family were attempted to be elicited. ? ?Discussed limitations of medical interventions to prolong quality of life in some situations and discussed the concept of human mortality. They would like for her to eat and drink as she pleases, and have care provided for comfort and dignity.  ? ?They discuss other family members  who have had hospice care. Many scenarios discussed. Ultimately ,they state the family would not be able to provide the care she needs at home and would like hospice facility placement for end of life care until death. Discussed comfort care here in the hospital, and visitation. Discussed that when a hospice bed is available, if she is not stable for transport, she will stay here and complete the dying process here in the hospital.  ? ?I completed a MOST form today with husband, and sons at bedside. The signed original was placed in the chart. A photocopy was placed in the chart to be scanned into  EMR. The family outlined their wishes for the following treatment decisions: ? ?Cardiopulmonary Resuscitation: Do Not Attempt Resuscitation (DNR/No CPR)  ?Medical Interventions: Comfort Measures: Keep clean, warm, and dry. Use medication by any route, positioning, wound care, and other measures to relieve pain and suffering. Use oxygen, suction and manual treatment of airway obstruction as needed for comfort. Do not transfer to the hospital unless comfort needs cannot be met in current location.  ?Antibiotics: No antibiotics (use other measures to relieve symptoms)  ?IV Fluids: No IV fluids (provide other measures to ensure comfort)  ?Feeding Tube: No feeding tube  ?  ? ?Length of Stay: 11 ? ?Current Medications: ?Scheduled Meds:  ? erythromycin  1 application. Left Eye BID  ? latanoprost  1 drop Both Eyes QHS  ? metoprolol tartrate  50 mg Per Tube BID  ? ? ?Continuous Infusions: ? ? ?PRN Meds: ?acetaminophen, albuterol, diltiazem, hydrALAZINE, LORazepam, methocarbamol, metoprolol tartrate, morphine injection, ondansetron (ZOFRAN) IV ? ?Physical Exam ?Constitutional:   ?   Comments: Eyes closed. Facial expressions made which appear to be dreams.   ?         ? ?Vital Signs: BP 125/69 (BP Location: Left Arm)   Pulse 99   Temp 98.1 ?F (36.7 ?C)   Resp 19   Ht _0  (1.676 m)   Wt 90.8 kg   SpO2 99%   BMI 32.31 kg/m?   ?SpO2: SpO2: 99 % ?O2 Device: O2 Device: Nasal Cannula ?O2 Flow Rate: O2 Flow Rate (L/min): 2 L/min ? ?Intake/output summary:  ?Intake/Output Summary (Last 24 hours) at 12/05/2021 1321 ?Last data filed at 12/05/2021 1247 ?Gross per 24 hour  ?Intake 500 ml  ?Output 700 ml  ?Net -200 ml  ? ?LBM: Last BM Date : 12/01/21 ?Baseline Weight: Weight: 86.6 kg ?Most recent weight: Weight: 90.8 kg ? ? ? ?Patient Active Problem List  ? Diagnosis Date Noted  ? Thrombocytopenia ([place]) 12/03/2021  ? Chronic diastolic CHF (congestive heart failure) ([place]) 11/24/2021  ? CAD (coronary artery disease) 11/24/2021  ? Iron deficiency anemia 11/24/2021  ? Dementia ([place]) 11/24/2021  ? Hypokalemia 11/24/2021  ? Acute metabolic encephalopathy 11/94/1740  ? Acute on chronic diastolic CHF (congestive heart failure) ([place]) 11/24/2021  ? Status post total hip replacement, right 10/17/2021  ? Inability to ambulate due to right hip 09/19/2020  ? Weakness 09/18/2020  ? Cerumen debris on tympanic membrane, right 06/17/2018  ? UTI (urinary tract infection) 06/17/2018  ? Multiple rib fractures 01/27/2018  ? Advanced care planning/counseling discussion 06/11/2017  ? Thoracic aortic aneurysm without rupture ([place]) 12/09/2016  ? Aortic calcification ([place]) 12/09/2016  ? SOBOE (shortness of breath on exertion) 11/03/2016  ? Gastroesophageal reflux disease without esophagitis 05/14/2016  ? CKD (chronic kidney disease) stage 3, GFR 30-59 ml/min (HCC) 05/14/2016  ? Atrial fibrillation ([place]) 12/05/2015  ? Hypothyroidism 12/05/2015  ? Hyperlipidemia 12/05/2015  ? Hypertensive heart and renal disease with congestive heart failure and renal failure ([place]) 06/07/2015  ? CHF (congestive heart failure), NYHA class II, chronic, systolic ([place]) 81/44/8185  ? Pulmonary nodules 05/07/2015  ? Chronic systolic CHF (congestive heart failure), NYHA class 2 ([place]) 10/11/2014  ? Benign essential HTN 04/06/2014  ? Chronic a-fib ([place]) 04/06/2014  ? ? ?Palliative Care Assessment & Plan   ? ?Recommendations/Plan: ?Family has decided they would like comfort care with hospice facility placement.  ? ?Code Status: ? ?  ?Code Status Orders  ?(From admission, onward)  ?  ? ? ?  ? ?  Start     Ordered  ? 11/24/21 1539  Do not attempt resuscitation (DNR)  Continuous       ? 11/24/21 1538  ? ?  ?  ? ?  ? ?Code Status History   ? ? Date Active Date Inactive Code Status Order ID Comments User Context  ? 11/24/2021 1359 11/24/2021 1538 DNR [id number]  Ivor Costa, MD ED  ? 11/24/2021 1203 11/24/2021 1358 Full Code [id number]  Ivor Costa, MD ED  ? 10/17/2021 1237 10/19/2021 1809 Full Code [id number]  Hessie Knows, MD Inpatient  ? 09/18/2020 1304 09/20/2020 1752 Full Code [id number]  Cox[facility]riant Cedar, DO ED  ? ?  ? ? ?Prognosis: ? < 2 weeks ? ? ? ?Care plan was discussed with [facility], MD, RN.  ? ?Thank you for allowing the Palliative Medicine Team to assist in the care of this patient. ? ? ?Asencion Gowda, NP ? ?Please contact Palliative Medicine Team phone at [phone] for questions and concerns.  ? ? ? ? ? ?

## 2021-12-05 NOTE — Progress Notes (Signed)
OT Cancellation Note ? ?Patient Details ?Name: Brittney Ramirez ?MRN: [id number] ?DOB: 10-20-1940 ? ? ?Cancelled Treatment:    Reason Eval/Treat Not Completed: Other (comment). Patient discharged from OT services secondary to medical decline and deciding to proceed with comfort measures. OT will sign off. Please re-consult if acute OT needs arise. ? ?Ardeth Perfect., MPH, MS, OTR/L ?ascom [phone] ?12/05/21, 1:08 PM ? ?

## 2021-12-05 NOTE — Progress Notes (Signed)
Physical Therapy Discharge ?Patient Details ?Name: Brittney Ramirez ?MRN: [id number] ?DOB: 08-Apr-1941 ?Today's Date: 12/05/2021 ?Time:  -  ?  ? ?Patient discharged from PT services secondary to medical decline and deciding to proceed with comfort measures. We will sign off. Thanks for allow [place] participation in this pt's care.  ? ?Please see latest therapy progress note for current level of functioning and progress toward goals.   ? ?Progress and discharge plan discussed with MD/family. ? ? ?Willette Pa ?12/05/2021, 12:57 PM  ?

## 2021-12-05 NOTE — Progress Notes (Signed)
?PROGRESS NOTE ? ? ? ?VERNELL BACK  VEH:[id number] DOB: 18-Jun-1941 DOA: 11/24/2021 ?PCP: Adin Hector, MD  ? ?Assessment & Plan: ?  ?Principal Problem: ?  Acute on chronic diastolic CHF (congestive heart failure) ([place]) ?Active Problems: ?  Hypothyroidism ?  Hyperlipidemia ?  Benign essential HTN ?  Chronic a-fib ([place]) ?  CAD (coronary artery disease) ?  Iron deficiency anemia ?  Dementia ([place]) ?  Hypokalemia ?  Acute metabolic encephalopathy ?  Thrombocytopenia ([place]) ? ?Failure to thrive: secondary to all below. After the family meeting today w/ palliative care, pt's family decided to proceed w/ comfort care only  ? ?Acute on chronic diastolic CHF: echo on 8/36/6294 showed EF of 50%. Monitor I/Os. Continue on metoprolol. Euvolemic on 4/18 ? ?PAF: w/ RVR. Continue on metoprolol. No anticoagulation as pt is on comfort care ? ?Aspiration pneumonia: continue w/ comfort care only  ? ?Thrombocytopenia: HIT was neg 4/18.  ? ?Hypomagnesemia: no more labs as pt is comfort care only  ? ?Hypophosphatemia: no more labs as pt is comfort care only  ? ?UTI: completed abx course  ? ?Nutrition: continue w/ NG tube & tube feeds, started on 4/17. But pt pulled out NG tube overnight and it will not be placed again as pt is on comfort care only  ? ?Acute metabolic encephalopathy: abnormal EEG on 4/15 & no further work-up other than whats already done as per neuro. MRI brain shows cerebral atrophy & small remote lacunar infarct at the right thalamus. Hx of dementia ? ?Transaminitis: etiology unclear. Continue w/ supportive care  ? ?Hypernatremia: resolved ? ?AKI: resolved ? ?Hypothyroidism: comfort care  ? ?HLD: comfort care   ? ?HTN: continue metoprolol & IV cardizem prn ? ?Hx of CAD: continue on metoprolol  ? ?Iron deficiency anemia: comfort care only  ? ? ? ? ?DVT prophylaxis: lovenox  ?Code Status: DNR ?Family Communication: discussed pt's care w/ pt's family at bedside and answered their questions  ?Disposition Plan: comfort care   ? ?Level of care: Progressive ? ?Status is: Inpatient ?Remains inpatient appropriate because: comfort care only  ? ? ? ?Consultants:  ?Palliative care  ?neuro ? ? ?Procedures:  ? ?Antimicrobials: ? ? ?Subjective: ?Pt is lethargic & unable to answer questions appropriately  ? ?Objective: ?Vitals:  ? 12/04/21 1947 12/04/21 2316 12/05/21 0331 12/05/21 0756  ?BP: (!) 141/96 (!) 120/99 (!) 124/93 124/74  ?Pulse: (!) 105 100 (!) 103 (!) 106  ?Resp: '20 20 16 20  '$ ?Temp: 98.8 ?F (37.1 ?C) 98.6 ?F (37 ?C) 98.3 ?F (36.8 ?C) 98.4 ?F (36.9 ?C)  ?TempSrc:      ?SpO2: 99% 97% 97% 98%  ?Weight:   90.8 kg   ?Height:      ? ? ?Intake/Output Summary (Last 24 hours) at 12/05/2021 0806 ?Last data filed at 12/05/2021 0700 ?Gross per 24 hour  ?Intake 1293.75 ml  ?Output 650 ml  ?Net 643.75 ml  ? ?Filed Weights  ? 12/03/21 0437 12/04/21 0500 12/05/21 0331  ?Weight: 89.8 kg 90.1 kg 90.8 kg  ? ? ?Examination: ? ?General exam: Appears lethargic  ?Respiratory system: decreased breath sounds  ?Cardiovascular system: S1/S2+. No rubs or clicks  ?Gastrointestinal system: Abd is soft, NT, ND & hypoactive bowel sounds  ?Central nervous system: Lethargic  ?Psychiatry: Judgement and insight appears poor. Flat mood and affect  ? ? ? ?Data Reviewed: I have personally reviewed following labs and imaging studies ? ?CBC: ?Recent Labs  ?Lab 11/29/21 ?7654 11/30/21 ?6503  12/02/21 ?7026 12/04/21 ?3785 12/05/21 ?8850  ?WBC 6.9 8.2 10.3 10.5 9.2  ?HGB 9.6* 10.8* 10.5* 10.6* 10.6*  ?HCT 31.8* 36.1 33.8* 35.7* 34.7*  ?MCV 97.2 97.3 94.7 95.5 95.6  ?PLT 126* 115* 110* 136* 148*  ? ?Basic Metabolic Panel: ?Recent Labs  ?Lab 11/29/21 ?0742 11/30/21 ?0440 12/02/21 ?0617 12/02/21 ?1648 12/03/21 ?0708 12/03/21 ?1912 12/04/21 ?2774 12/05/21 ?1287  ?NA 141  --  139  --  138  --  137 137  ?K 3.0* 3.9 2.8*  --  3.5  --  3.5 4.0  ?CL 107  --  105  --  104  --  105 106  ?CO2 25  --  26  --  27  --  27 29  ?GLUCOSE 127*  --  102*  --  143*  --  143* 106*  ?BUN 30*  --  15  --   18  --  21 18  ?CREATININE 1.09*  --  0.84  --  0.79  --  0.71 0.66  ?CALCIUM 8.4*  --  8.5*  --  8.2*  --  8.0* 7.8*  ?MG  --   --   --  1.5* 1.6* 2.3 2.0  --   ?PHOS  --   --   --  1.6* 1.4* 1.2* 1.7*  --   ? ?GFR: ?Estimated Creatinine Clearance: 61.5 mL/min (by C-G formula based on SCr of 0.66 mg/dL). ?Liver Function Tests: ?Recent Labs  ?Lab 11/29/21 ?8676 11/30/21 ?0440 12/01/21 ?7209 12/03/21 ?0708 12/05/21 ?4709  ?AST 353* 216* 101* 34 44*  ?ALT 575* 494* 324* 149* 103*  ?ALKPHOS 89 95 100 75 75  ?BILITOT 1.4* 1.6* 2.4* 1.3* 0.8  ?PROT 5.7* 6.3* 6.4* 5.4* 5.2*  ?ALBUMIN 3.3* 3.4* 3.5 2.7* 2.5*  ? ?No results for input(s): LIPASE, AMYLASE in the last 168 hours. ?No results for input(s): AMMONIA in the last 168 hours. ? ?Coagulation Profile: ?Recent Labs  ?Lab 11/29/21 ?1648  ?INR 1.8*  ? ?Cardiac Enzymes: ?No results for input(s): CKTOTAL, CKMB, CKMBINDEX, TROPONINI in the last 168 hours. ?BNP (last 3 results) ?No results for input(s): PROBNP in the last 8760 hours. ?HbA1C: ?No results for input(s): HGBA1C in the last 72 hours. ?CBG: ?Recent Labs  ?Lab 12/04/21 ?1227 12/04/21 ?1613 12/04/21 ?1947 12/04/21 ?2317 12/05/21 ?0359  ?GLUCAP 128* 134* 126* 113* 109*  ? ?Lipid Profile: ?No results for input(s): CHOL, HDL, LDLCALC, TRIG, CHOLHDL, LDLDIRECT in the last 72 hours. ?Thyroid Function Tests: ?No results for input(s): TSH, T4TOTAL, FREET4, T3FREE, THYROIDAB in the last 72 hours. ?Anemia Panel: ?No results for input(s): VITAMINB12, FOLATE, FERRITIN, TIBC, IRON, RETICCTPCT in the last 72 hours. ?Sepsis Labs: ?No results for input(s): PROCALCITON, LATICACIDVEN in the last 168 hours. ? ?Recent Results (from the past 240 hour(s))  ?Urine Culture     Status: Abnormal  ? Collection Time: 11/25/21  6:57 PM  ? Specimen: Urine, Clean Catch  ?Result Value Ref Range Status  ? Specimen Description   Final  ?  URINE, CLEAN CATCH ?Performed at [facility], [address] [place], [place] [address] ?  ? Special  Requests   Final  ?  NONE ?Performed at [facility], [address] [place], [place] [address] ?  ? Culture MULTIPLE SPECIES PRESENT, SUGGEST RECOLLECTION (A)  Final  ? Report Status 11/27/2021 FINAL  Final  ?  ? ? ? ? ? ?Radiology Studies: ?No results found. ? ? ? ? ? ?Scheduled Meds: ? Chlorhexidine Gluconate  Cloth  6 each Topical Daily  ? enoxaparin (LOVENOX) injection  65 mg Subcutaneous Q12H  ? erythromycin  1 application. Left Eye BID  ? feeding supplement (PROSource TF)  45 mL Per Tube Daily  ? latanoprost  1 drop Both Eyes QHS  ? levothyroxine  88 mcg Per Tube QAC breakfast  ? metoprolol tartrate  50 mg Per Tube BID  ? vitamin B-12  1,000 mcg Per Tube Daily  ? ?Continuous Infusions: ? sodium chloride Stopped (11/26/21 1844)  ? dextrose 50 mL/hr at 12/05/21 0328  ? feeding supplement (OSMOLITE 1.2 CAL) Stopped (12/05/21 0130)  ? meropenem (MERREM) IV 1 g (12/05/21 0158)  ? ? ? LOS: 11 days  ? ? ?Time spent: 15 mins  ? ? ? ?Wyvonnia Dusky, MD ?[facility] ?Pager [phone] ? ?If 7PM-7AM, please contact night-coverage ?12/05/2021, 8:06 AM  ? ?

## 2021-12-05 NOTE — Progress Notes (Signed)
Pt pulled out NGT at 0130. Pt is confused, agitated, and unable to follow commands. MD made aware and instructed this RN to replace NGT. 3 unsuccessful attempts were made to replace NGT d/t pt being unable to follow swallow commands. MD made aware again. No new orders at this time.  ?

## 2021-12-05 NOTE — Progress Notes (Addendum)
? ?      CROSS COVER NOTE ? ?NAME: Brittney Ramirez ?MRN: [id number] ?DOB : [birth year] ? ? ?Secure chat received from nursing that [name] pulled her NGT. Nursing attempted to replace NGT and were unsucessful x3. Consider replacement after goals of care conversation with family today. Will start D5W at 67m in the interim, continue q4H CBG monitoring. ? ?KNeomia GlassMHA, MSN, FNP-BC ?Nurse Practitioner ?[facility] ?[place] ?Pager [phone]? ?

## 2021-12-05 NOTE — Progress Notes (Signed)
Nutrition Brief Note  Chart reviewed. Pt now transitioning to comfort care.  No further nutrition interventions planned at this time.  Please re-consult as needed.   Glenyce Randle W, RD, LDN, CDCES Registered Dietitian II Certified Diabetes Care and Education Specialist Please refer to AMION for RD and/or RD on-call/weekend/after hours pager   

## 2021-12-05 NOTE — Care Management Important Message (Signed)
Important Message ? ?Patient Details  ?Name: Brittney Ramirez ?MRN: [id number] ?Date of Birth: 09-06-1940 ? ? ?Medicare Important Message Given:  Other (see comment) ? ?Comfort care with plan to transfer to hospice home pending approval and bed availability.  Medicare IM withheld at this time out of respect for patient and family.  ? ? ?[name] ?12/05/2021, 5:29 PM ?

## 2021-12-05 NOTE — Progress Notes (Signed)
Manufacturing engineer [facility]) Hospital Liaison Note ? ?Received request from [employer] [place] for family interest in [facility]. Visited patient at bedside and spoke with spouse/Cornelius to confirm interest and explain services. ? ?Approval for Hospice Home is determined by [facility] MD. Once [facility] MD has determined Hospice Home eligibility, [facility] will update hospital staff and family. Eligibility pending ? ?Please do not hesitate to call with any hospice related questions.  ?  ?Thank you for the opportunity to participate in this patient's care. ? ?Daphene Calamity, MSW ?[place]  ?[phone] ? ?

## 2021-12-06 DIAGNOSIS — R627 Adult failure to thrive: Secondary | ICD-10-CM | POA: Diagnosis not present

## 2021-12-06 DIAGNOSIS — J690 Pneumonitis due to inhalation of food and vomit: Secondary | ICD-10-CM | POA: Diagnosis not present

## 2021-12-06 DIAGNOSIS — F03B0 Unspecified dementia, moderate, without behavioral disturbance, psychotic disturbance, mood disturbance, and anxiety: Secondary | ICD-10-CM | POA: Diagnosis not present

## 2021-12-06 MED ORDER — MORPHINE SULFATE (PF) 2 MG/ML IV SOLN: 2.0000 mg | INTRAVENOUS | 0 refills | Status: AC | PRN

## 2021-12-06 NOTE — Plan of Care (Signed)

## 2021-12-06 NOTE — Discharge Summary (Signed)
Physician Discharge Summary  ?MOHINI Ramirez ENI:[id number] DOB: 07-15-1941 DOA: 11/24/2021 ? ?PCP: Adin Hector, MD ? ?Admit date: 11/24/2021 ?Discharge date: 12/06/2021 ? ?Admitted From: home  ?Disposition:  hospice home  ? ?Recommendations for Outpatient Follow-up:  ?Follow up with hospice provider ASAP ? ?Home Health: no  ?Equipment/Devices: ? ?Discharge Condition: stable  ?CODE STATUS: DNR/DNI ?Diet recommendation: as tolerated  ? ?Brief/Interim Summary: ?HPI was taken from Dr. Blaine Hamper: ?Brittney Ramirez is a 81 y.o. female with medical history significant of hypertension, hyperlipidemia, GERD, hypothyroidism, OSA not on CPAP, CAD, CKD-3, CHF, atrial fibrillation on Xarelto, dementia, iron deficiency anemia, who presents with shortness of breath and altered mental status. ?  ?Patient was recently hospitalized from 3/2 - 3/4 due to right hip fracture.  Patient is s/p of right hip surgery. She finished course of rehab.  Per her son and husband at the bedside, in the past 2 days, patient has shortness breath which has been progressively worsening.  No chest pain, fever or chills.  Has mild dry cough.  Patient has history of dementia, but most of the time patient recognizes family members, is oriented to time and place. Patient is more confused today.  When I saw patient in the ED, patient is confused, knows her own name, but not orientated to time and place.  Patient moves all extremities normally.  No facial droop or slurred speech. Per her husband, patient has chronic intermittent loose stool bowel movement, but the patient is Colace and MiraLAX.  No nausea, vomiting or abdominal pain.  No symptoms of UTI.  Her last dose of Xarelto was last night. ?  ?Data Reviewed and ED Course: pt was found to have WBC 5.1, procalcitonin <0.10, BNP 915, troponin level 16, negative COVID PCR, positive D-dimer 199, potassium 3.1, renal function stable with creatinine 0.98 and BUN 18, lactic acid 1.5, temperature normal, blood pressure  160/74, heart rate 86, RR 23, oxygen saturation 97% on room air.  Chest x-ray showed left lower lobe patchy infiltration.  CT of head is negative.  CT angiogram is negative for PE, does showed moderate bilateral pleural effusion and pulmonary edema.  Patient is admitted to telemetry bed as inpatient ? ? ?As per Dr. Kurtis Bushman: ?hospital course: 4/12-4/18 ?Patient was found to be dehydrated.  His LFTs were in the thousands and started trending down with IV fluids.  She was found with dementia with delirium and aspiration pneumonia.  Antibiotics were broadened for her aspiration.  Started on IV fluids.  Neurology was consulted to discuss delirium and dementia with family as they had a hard time understanding and denied this.  Patient was started on NG tube for feeding (started on 4/17).per family's request.  Her AKI and LFTs were improving.  Mental status is minimally better.  Palliative care on board for goals of care. She is being weaned off cardizem gtt . She will need SNF once stable for discharge. ?  ?  ?4/18UO was less and dark, increase ivf this am.Palliative consulted to establish goal of care.  Family will meet on Thursday morning to further discuss.  They would like to determine if she will bounce back or if hospice will be implemented ?  ? ?As per Dr. Jimmye Norman 4/19-4/21/23: Pt was continue w/ tube feeds via NG tube. Pt pulled out the NG tube overnight of 4/20. Pt was not making much progress on the 2 days that saw the pt. Pt's family had a family meeting with pt on 12/05/21  and decided to proceed with comfort care only. Pt was d/c to hospice home the following day. For more information, please see previous progress/consult notes.  ? ? ?Discharge Diagnoses:  ?Principal Problem: ?  Acute on chronic diastolic CHF (congestive heart failure) ([place]) ?Active Problems: ?  Hypothyroidism ?  Hyperlipidemia ?  Benign essential HTN ?  Chronic a-fib ([place]) ?  CAD (coronary artery disease) ?  Iron deficiency anemia ?  Dementia  ([place]) ?  Hypokalemia ?  Acute metabolic encephalopathy ?  Thrombocytopenia ([place]) ? ?Failure to thrive: secondary to all below. After the family meeting today w/ palliative care, pt's family decided to proceed w/ comfort care only  ? ?Acute on chronic diastolic CHF: echo on 6/38/4536 showed EF of 50%. Monitor I/Os. Continue on metoprolol. Euvolemic on 4/18 ? ?PAF: w/ RVR. Continue on metoprolol. No anticoagulation as pt is on comfort care ? ?Aspiration pneumonia: continue w/ comfort care only  ? ?Thrombocytopenia: HIT was neg 4/18.  ? ?Hypomagnesemia: no more labs as pt is comfort care only  ? ?Hypophosphatemia: no more labs as pt is comfort care only  ? ?UTI: completed abx course  ? ?Nutrition: continue w/ NG tube & tube feeds, started on 4/17. But pt pulled out NG tube overnight and it will not be placed again as pt is on comfort care only  ? ?Acute metabolic encephalopathy: abnormal EEG on 4/15 & no further work-up other than whats already done as per neuro. MRI brain shows cerebral atrophy & small remote lacunar infarct at the right thalamus. Hx of dementia ? ?Transaminitis: etiology unclear. Continue w/ supportive care  ? ?Hypernatremia: resolved ? ?AKI: resolved ? ?Hypothyroidism: comfort care  ? ?HLD: comfort care   ? ?HTN: continue metoprolol & IV cardizem prn ? ?Hx of CAD: continue on metoprolol  ? ?Iron deficiency anemia: comfort care only  ? ?Discharge Instructions ? ?Discharge Instructions   ? ? Diet general   Complete by: As directed ?  ? Discharge instructions   Complete by: As directed ?  ? F/u w/ hospice provider as soon as possible  ? Increase activity slowly   Complete by: As directed ?  ? ?  ? ?Allergies as of 12/06/2021   ? ?   Reactions  ? Morphine And Related Nausea And Vomiting  ? Diarrhea  ? Buprenorphine Hcl Nausea And Vomiting  ? Diarrhea  ? Codeine Nausea Only  ? GI upset  ? Desipramine Nausea Only  ? Fosamax [alendronate] Rash  ? ?  ? ?  ?Medication List  ?  ? ?STOP taking these medications    ? ?atorvastatin 40 MG tablet ?Commonly known as: LIPITOR ?  ?donepezil 5 MG tablet ?Commonly known as: ARICEPT ?  ?ferrous [id number] C-folic acid capsule ?Commonly known as: TRINSICON / FOLTRIN ?  ?ferrous sulfate 325 (65 FE) MG EC tablet ?  ?Xarelto 15 MG Tabs tablet ?Generic drug: Rivaroxaban ?  ? ?  ? ?TAKE these medications   ? ?acetaminophen 500 MG tablet ?Commonly known as: TYLENOL ?Take 2 tablets (1,000 mg total) by mouth every 6 (six) hours. ?  ?docusate sodium 100 MG capsule ?Commonly known as: COLACE ?Take 1 capsule (100 mg total) by mouth 2 (two) times daily. ?  ?erythromycin ophthalmic ointment ?Place 1 application into the left eye in the morning and at bedtime. ?  ?HYDROcodone-acetaminophen 7.5-325 MG tablet ?Commonly known as: NORCO ?Take 0.5-1 tablets by mouth every 4 (four) hours as needed for severe pain (pain score 7-10). ?  ?  latanoprost 0.005 % ophthalmic solution ?Commonly known as: XALATAN ?Place 1 drop into both eyes at bedtime. ?  ?levothyroxine 88 MCG tablet ?Commonly known as: SYNTHROID ?Take 1 tablet (88 mcg total) by mouth daily before breakfast. ?  ?methocarbamol 500 MG tablet ?Commonly known as: ROBAXIN ?Take 1 tablet (500 mg total) by mouth every 6 (six) hours as needed for muscle spasms. ?  ?metoprolol succinate 100 MG 24 hr tablet ?Commonly known as: TOPROL-XL ?Take 100 mg by mouth daily. ?  ?morphine (PF) 2 MG/ML injection ?Inject 1 mL (2 mg total) into the vein every hour as needed. ?  ?ondansetron 4 MG tablet ?Commonly known as: ZOFRAN ?Take 1 tablet (4 mg total) by mouth every 6 (six) hours as needed for nausea. ?  ?polyethylene glycol 17 g packet ?Commonly known as: MIRALAX / GLYCOLAX ?Take 17 g by mouth daily as needed for mild constipation. ?  ?Prevagen 10 MG Caps ?Generic drug: Apoaequorin ?Take 10 mg by mouth daily. ?  ?valsartan-hydrochlorothiazide 160-25 MG tablet ?Commonly known as: DIOVAN-HCT ?Take 1 tablet by mouth daily. ?  ?vitamin B-12 1000 MCG  tablet ?Commonly known as: CYANOCOBALAMIN ?Take 1,000 mcg by mouth daily. ?  ? ?  ? ? ?Allergies  ?Allergen Reactions  ? Morphine And Related Nausea And Vomiting  ?  Diarrhea  ? Buprenorphine Hcl Nausea And Vomit

## 2021-12-06 NOTE — Progress Notes (Signed)
[facility] [address] [employer] [facility])  ? ?Consent forms to be completed. ? ?EMS notified of patient D/C and transport arranged for 1430. TOC/Ashley and Attending Physician/Dr.  also notified of transport arrangement.  ?  ?Please send signed DNR form with patient and RN call report to [phone].  ?  ?Daphene Calamity, MSW ?[place] ?[phone] ? ?

## 2021-12-16 DEATH — deceased

## 2021-12-18 ENCOUNTER — Ambulatory Visit: Payer: Medicare HMO | Admitting: Family

## 2023-01-21 IMAGING — MR MR HEAD W/O CM
6 series · 48 of 48 positions shown · non-contrast
Comparison: Head CT 12/22/2017

CLINICAL DATA: Mild cognitive impairment.

EXAM:
MRI HEAD WITHOUT CONTRAST
TECHNIQUE: Multiplanar, multiecho pulse sequences of the brain and surrounding
structures were obtained without intravenous contrast.
Additionally, using NeuroQuant software a 3D volumetric analysis of
the brain was performed and is compared to a normative database
adjusted for age, gender and intracranial volume.

[Series 52: nqsegcb_sc_cor · 1.00mm/px · 9 of 236 slices shown (1 of 2)]
[im 1/236]
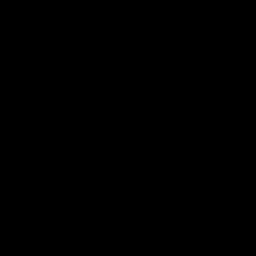
[im 30/236]
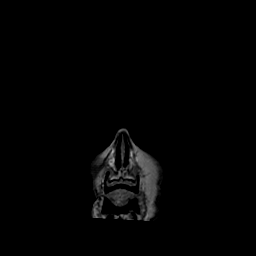
[im 59/236]
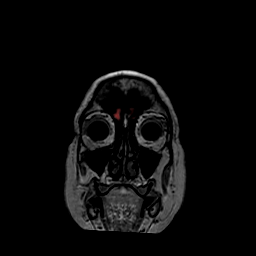
[im 89/236]
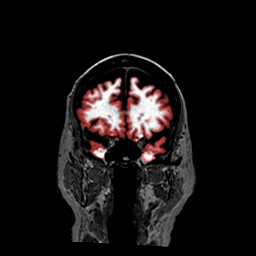
[im 118/236]
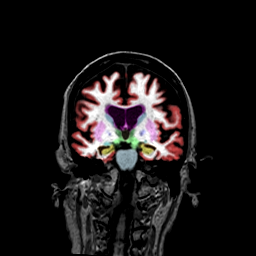
[im 147/236]
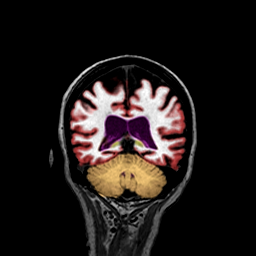
[im 177/236]
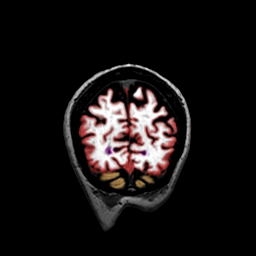
[im 206/236]
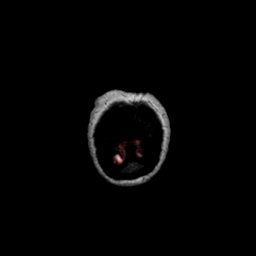
[im 236/236]
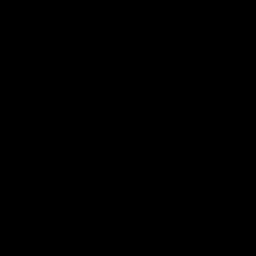

[Series 52: nqsegcb_sc_cor · 1.00mm/px · 9 of 236 slices shown (2 of 2)]
[im 1/236]
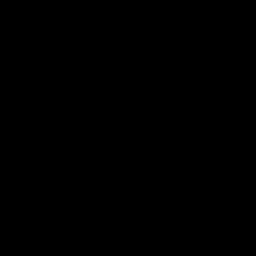
[im 30/236]
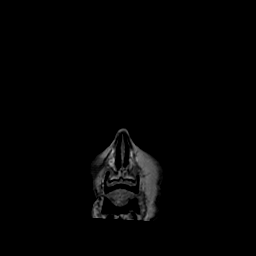
[im 59/236]
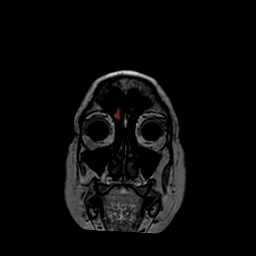
[im 89/236]
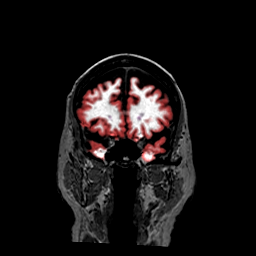
[im 118/236]
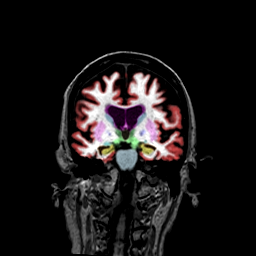
[im 147/236]
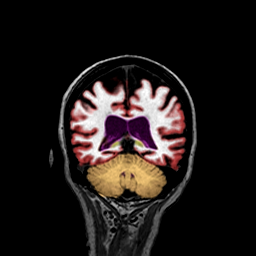
[im 177/236]
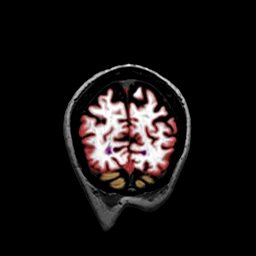
[im 206/236]
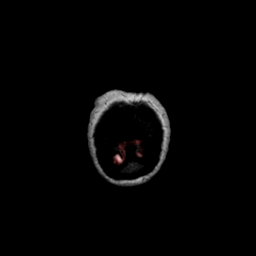
[im 236/236]
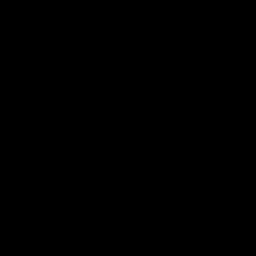

[Series 53: nqsegcb_sc_axl · 1.00mm/px · 8 of 225 slices shown (1 of 2)]
[im 1/225]
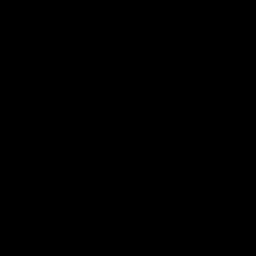
[im 33/225]
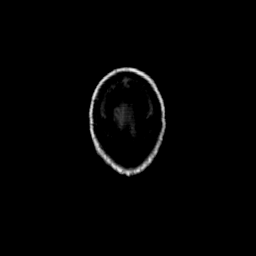
[im 65/225]
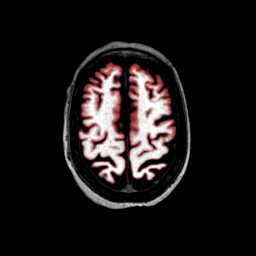
[im 97/225]
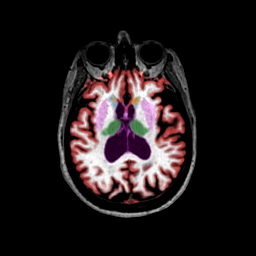
[im 129/225]
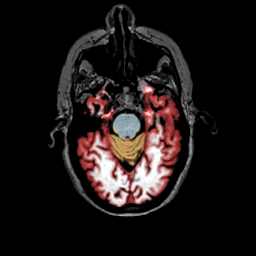
[im 161/225]
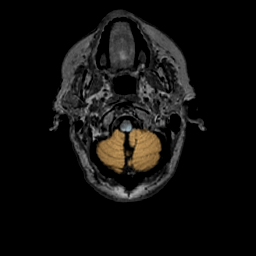
[im 193/225]
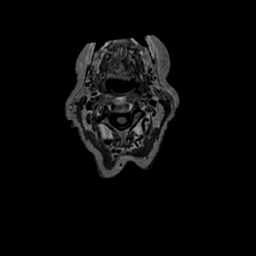
[im 225/225]
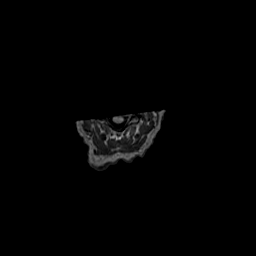

[Series 53: nqsegcb_sc_axl · 1.00mm/px · 8 of 225 slices shown (2 of 2)]
[im 1/225]
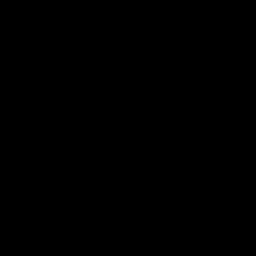
[im 33/225]
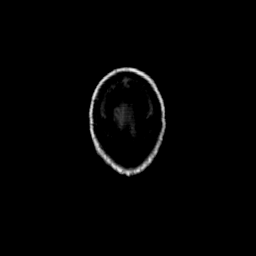
[im 65/225]
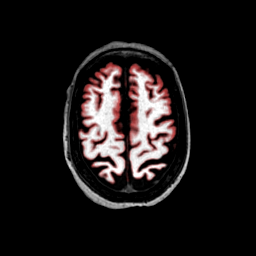
[im 97/225]
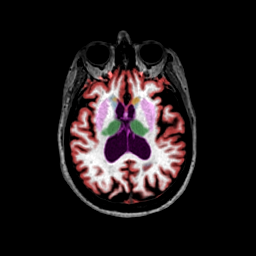
[im 129/225]
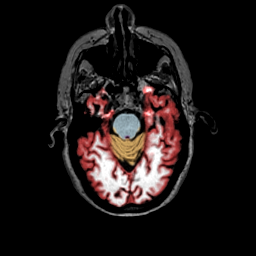
[im 161/225]
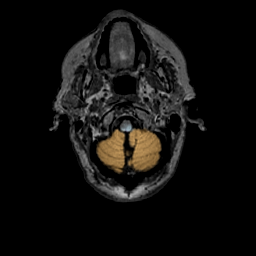
[im 193/225]
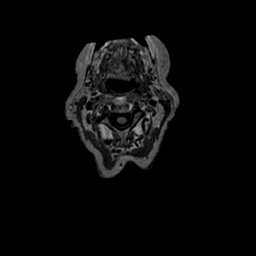
[im 225/225]
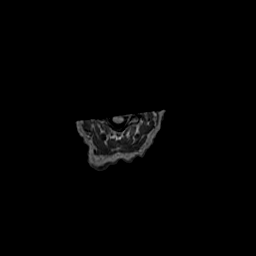

[Series 54: nqsegcb_sc_sag · 1.00mm/px · 7 of 200 slices shown (1 of 2)]
[im 1/200]
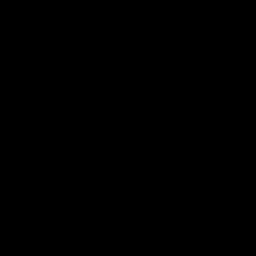
[im 34/200]
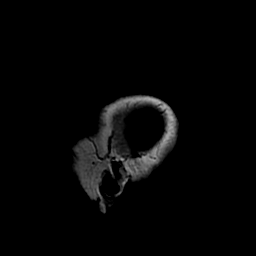
[im 67/200]
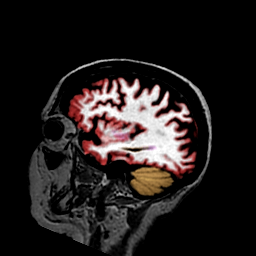
[im 100/200]
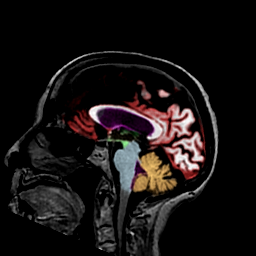
[im 133/200]
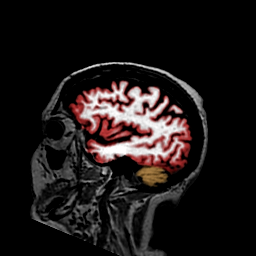
[im 166/200]
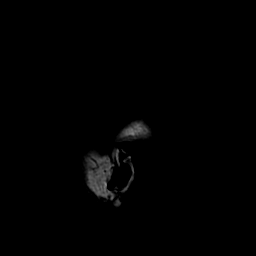
[im 200/200]
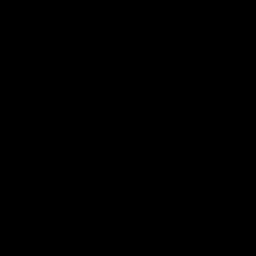

[Series 54: nqsegcb_sc_sag · 1.00mm/px · 7 of 200 slices shown (2 of 2)]
[im 1/200]
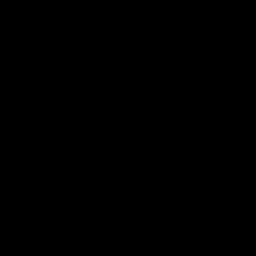
[im 34/200]
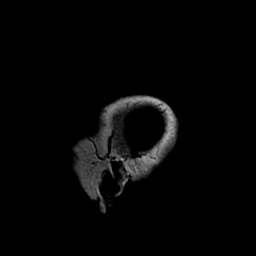
[im 67/200]
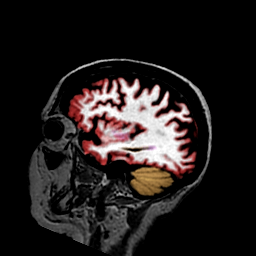
[im 100/200]
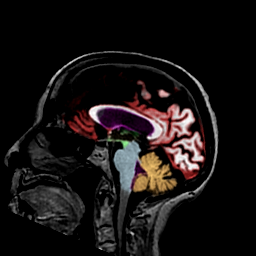
[im 133/200]
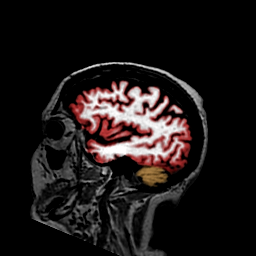
[im 166/200]
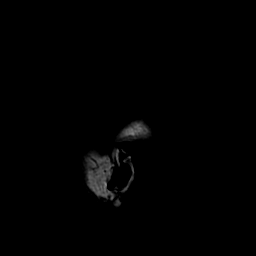
[im 200/200]
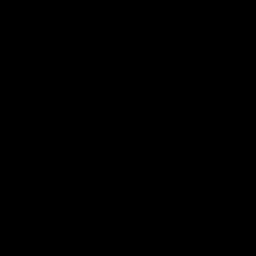

[48 of 48 positions shown; findings below may reference images not displayed]

FINDINGS: Brain: There is no evidence of an acute infarct, intracranial
hemorrhage, mass, or midline shift. Small T2 hyperintensities in the
cerebral white matter bilaterally are nonspecific but compatible
with mild chronic small vessel ischemic disease. A small amount of
asymmetric extra-axial fluid in the posterior fossa lateral to the
left cerebellar hemisphere is unchanged and without significant
associated mass effect. There is cerebral atrophy with ex vacuo
dilatation of the ventricles.

Vascular: Major intracranial vascular flow voids are preserved.

Skull and upper cervical spine: Unremarkable bone marrow signal.

Sinuses/Orbits: Bilateral cataract extraction. Paranasal sinuses and
mastoid air cells are clear.

Other: None.

NeuroQuant Findings:

Volumetric analysis of the brain was performed, with a fully
detailed report in [HOSPITAL] PACS. Briefly, the comparison with age and
gender matched reference reveals whole brain volume to be greatly
decreased (less than the first percentile).
IMPRESSION: 1. No acute intracranial abnormality.
2. Mild chronic small vessel ischemic disease.
3. NeuroQuant volumetric analysis of the brain, see details on
[HOSPITAL] PACS.
